# Patient Record
Sex: Male | Born: 1935 | Race: White | Hispanic: No
Health system: Southern US, Community
[De-identification: ages and names within clinical notes are randomized; demographics above are authoritative.]

## PROBLEM LIST (undated history)

## (undated) DIAGNOSIS — I440 Atrioventricular block, first degree: Secondary | ICD-10-CM

## (undated) DIAGNOSIS — R7989 Other specified abnormal findings of blood chemistry: Secondary | ICD-10-CM

## (undated) DIAGNOSIS — E669 Obesity, unspecified: Secondary | ICD-10-CM

## (undated) DIAGNOSIS — R972 Elevated prostate specific antigen [PSA]: Secondary | ICD-10-CM

## (undated) DIAGNOSIS — K259 Gastric ulcer, unspecified as acute or chronic, without hemorrhage or perforation: Secondary | ICD-10-CM

## (undated) DIAGNOSIS — G250 Essential tremor: Secondary | ICD-10-CM

## (undated) DIAGNOSIS — R42 Dizziness and giddiness: Secondary | ICD-10-CM

## (undated) DIAGNOSIS — I69919 Unspecified symptoms and signs involving cognitive functions following unspecified cerebrovascular disease: Secondary | ICD-10-CM

## (undated) DIAGNOSIS — G252 Other specified forms of tremor: Secondary | ICD-10-CM

## (undated) DIAGNOSIS — I213 ST elevation (STEMI) myocardial infarction of unspecified site: Secondary | ICD-10-CM

## (undated) DIAGNOSIS — I10 Essential (primary) hypertension: Secondary | ICD-10-CM

## (undated) DIAGNOSIS — E559 Vitamin D deficiency, unspecified: Secondary | ICD-10-CM

## (undated) DIAGNOSIS — N529 Male erectile dysfunction, unspecified: Secondary | ICD-10-CM

## (undated) DIAGNOSIS — L258 Unspecified contact dermatitis due to other agents: Secondary | ICD-10-CM

## (undated) DIAGNOSIS — M25569 Pain in unspecified knee: Secondary | ICD-10-CM

## (undated) DIAGNOSIS — H409 Unspecified glaucoma: Secondary | ICD-10-CM

## (undated) DIAGNOSIS — N400 Enlarged prostate without lower urinary tract symptoms: Secondary | ICD-10-CM

## (undated) DIAGNOSIS — L570 Actinic keratosis: Secondary | ICD-10-CM

## (undated) DIAGNOSIS — R350 Frequency of micturition: Secondary | ICD-10-CM

## (undated) DIAGNOSIS — N3941 Urge incontinence: Secondary | ICD-10-CM

## (undated) DIAGNOSIS — G4700 Insomnia, unspecified: Secondary | ICD-10-CM

## (undated) DIAGNOSIS — R269 Unspecified abnormalities of gait and mobility: Secondary | ICD-10-CM

## (undated) HISTORY — DX: Urge incontinence: N39.41

## (undated) HISTORY — PX: MULTIPLE TOOTH EXTRACTIONS: SHX2053

## (undated) HISTORY — DX: Elevated prostate specific antigen (PSA): R97.20

## (undated) HISTORY — DX: Gastric ulcer, unspecified as acute or chronic, without hemorrhage or perforation: K25.9

## (undated) HISTORY — DX: Vitamin D deficiency, unspecified: E55.9

## (undated) HISTORY — DX: Unspecified abnormalities of gait and mobility: R26.9

## (undated) HISTORY — DX: Pain in unspecified knee: M25.569

## (undated) HISTORY — DX: Male erectile dysfunction, unspecified: N52.9

## (undated) HISTORY — DX: Actinic keratosis: L57.0

## (undated) HISTORY — DX: Benign prostatic hyperplasia without lower urinary tract symptoms: N40.0

## (undated) HISTORY — DX: Atrioventricular block, first degree: I44.0

## (undated) HISTORY — DX: Obesity, unspecified: E66.9

## (undated) HISTORY — DX: Essential tremor: G25.2

## (undated) HISTORY — DX: Dizziness and giddiness: R42

## (undated) HISTORY — DX: Other specified abnormal findings of blood chemistry: R79.89

## (undated) HISTORY — PX: CATARACT EXTRACTION W/ INTRAOCULAR LENS  IMPLANT, BILATERAL: SHX1307

## (undated) HISTORY — DX: Frequency of micturition: R35.0

## (undated) HISTORY — PX: APPENDECTOMY: SHX54

## (undated) HISTORY — DX: Unspecified symptoms and signs involving cognitive functions following unspecified cerebrovascular disease: I69.919

## (undated) HISTORY — DX: Unspecified contact dermatitis due to other agents: L25.8

## (undated) HISTORY — DX: Essential tremor: G25.0

## (undated) HISTORY — DX: Insomnia, unspecified: G47.00

## (undated) HISTORY — DX: Essential (primary) hypertension: I10

## (undated) HISTORY — DX: Unspecified glaucoma: H40.9

---

## 1975-11-24 HISTORY — PX: CYST EXCISION: SHX5701

## 1985-11-23 HISTORY — PX: TRANSURETHRAL RESECTION OF PROSTATE: SHX73

## 2005-12-31 ENCOUNTER — Ambulatory Visit: Payer: Self-pay | Admitting: Internal Medicine

## 2006-01-04 ENCOUNTER — Ambulatory Visit: Payer: Self-pay | Admitting: Internal Medicine

## 2006-02-02 ENCOUNTER — Ambulatory Visit: Payer: Self-pay | Admitting: Internal Medicine

## 2013-03-12 ENCOUNTER — Other Ambulatory Visit: Payer: Self-pay | Admitting: Internal Medicine

## 2013-03-14 ENCOUNTER — Other Ambulatory Visit: Payer: Self-pay | Admitting: Internal Medicine

## 2013-03-24 ENCOUNTER — Other Ambulatory Visit: Payer: Medicare Other

## 2013-03-24 ENCOUNTER — Other Ambulatory Visit: Payer: Self-pay | Admitting: *Deleted

## 2013-03-24 DIAGNOSIS — R972 Elevated prostate specific antigen [PSA]: Secondary | ICD-10-CM

## 2013-03-24 DIAGNOSIS — Z13220 Encounter for screening for lipoid disorders: Secondary | ICD-10-CM

## 2013-03-24 DIAGNOSIS — R7989 Other specified abnormal findings of blood chemistry: Secondary | ICD-10-CM

## 2013-03-24 DIAGNOSIS — I10 Essential (primary) hypertension: Secondary | ICD-10-CM

## 2013-03-25 LAB — BASIC METABOLIC PANEL
BUN/Creatinine Ratio: 16 (ref 10–22)
BUN: 19 mg/dL (ref 8–27)
CO2: 24 mmol/L (ref 19–28)
Calcium: 9.2 mg/dL (ref 8.6–10.2)
Chloride: 103 mmol/L (ref 97–108)
Creatinine, Ser: 1.21 mg/dL (ref 0.76–1.27)
Glucose: 130 mg/dL — ABNORMAL HIGH (ref 65–99)

## 2013-03-25 LAB — LIPID PANEL
Cholesterol, Total: 129 mg/dL (ref 100–199)
VLDL Cholesterol Cal: 13 mg/dL (ref 5–40)

## 2013-03-25 LAB — HEMOGLOBIN A1C: Est. average glucose Bld gHb Est-mCnc: 128 mg/dL

## 2013-03-29 ENCOUNTER — Encounter: Payer: Self-pay | Admitting: *Deleted

## 2013-03-29 ENCOUNTER — Ambulatory Visit (INDEPENDENT_AMBULATORY_CARE_PROVIDER_SITE_OTHER): Payer: Medicare Other | Admitting: Internal Medicine

## 2013-03-29 ENCOUNTER — Encounter: Payer: Self-pay | Admitting: Internal Medicine

## 2013-03-29 VITALS — BP 160/56 | HR 58 | Temp 96.6°F | Wt 175.2 lb

## 2013-03-29 DIAGNOSIS — R7989 Other specified abnormal findings of blood chemistry: Secondary | ICD-10-CM

## 2013-03-29 DIAGNOSIS — E119 Type 2 diabetes mellitus without complications: Secondary | ICD-10-CM | POA: Insufficient documentation

## 2013-03-29 DIAGNOSIS — E559 Vitamin D deficiency, unspecified: Secondary | ICD-10-CM | POA: Insufficient documentation

## 2013-03-29 DIAGNOSIS — G250 Essential tremor: Secondary | ICD-10-CM | POA: Insufficient documentation

## 2013-03-29 DIAGNOSIS — I10 Essential (primary) hypertension: Secondary | ICD-10-CM | POA: Insufficient documentation

## 2013-03-29 DIAGNOSIS — G252 Other specified forms of tremor: Secondary | ICD-10-CM

## 2013-03-29 NOTE — Patient Instructions (Signed)
Continue current medications. 

## 2013-03-29 NOTE — Progress Notes (Signed)
  Subjective:    Patient ID: John Mason, male    DOB: October 12, 1929, 79 y.o.   MRN: [id number]  HPI Recent head cold with cough, otherwise feeling well.  Knees doing better.  Rare urinary incontinence. Urgency. Denies nocturia.  Diabetes mellitus: Under reasonable control. Latest fasting glucose was 130. Hemoglobin A1c 6.1.   Review of Systems  Constitutional: Negative for fever, activity change, appetite change, fatigue and unexpected weight change.  HENT: Positive for hearing loss. Negative for ear pain, congestion, neck pain, neck stiffness, tinnitus and ear discharge.   Eyes: Negative.   Respiratory: Negative.   Cardiovascular: Negative.  Negative for chest pain, palpitations and leg swelling.  Gastrointestinal: Negative.   Endocrine:       Elevated blood sugars for several years. I would categorize him as diabetes mellitus, uncomplicated, diet-controlled.  Genitourinary: Negative.   Musculoskeletal: Negative.   Skin: Negative.   Allergic/Immunologic: Negative.   Neurological: Negative.        Patient has a slightly slurred speech which is normal for him.  Hematological: Negative.   Psychiatric/Behavioral: Negative.        Objective:   Physical Exam  Constitutional: He is oriented to person, place, and time. He appears well-developed and well-nourished. No distress.  HENT:  Head: Normocephalic and atraumatic.  Mild hearing loss.  Eyes: Conjunctivae and EOM are normal. Pupils are equal, round, and reactive to light.  Neck: Normal range of motion. Neck supple. No JVD present. No tracheal deviation present. No thyromegaly present.  Cardiovascular: Normal rate, regular rhythm and intact distal pulses.  Exam reveals friction rub. Exam reveals no gallop.   No murmur heard. Pulmonary/Chest: Effort normal and breath sounds normal. No respiratory distress. He has no wheezes. He has no rales. He exhibits no tenderness.  Abdominal: Soft. Bowel sounds are normal. He exhibits no  distension and no mass. There is no tenderness.  Musculoskeletal: Normal range of motion. He exhibits no edema and no tenderness.  Lymphadenopathy:    He has no cervical adenopathy.  Neurological: He is alert and oriented to person, place, and time. No cranial nerve deficit.  Skin: Skin is warm and dry. No rash noted. There is erythema. No pallor.  Psychiatric: He has a normal mood and affect. His behavior is normal. Judgment and thought content normal.     Lab reports 03/24/13 BMP: glu 130, otherwise nl  A1c 6.1  LipId: TC 1129, trig 66, HDL 51, LDL 65     Assessment & Plan:  1. Unspecified vitamin D deficiency It should be rechecked in the future  2. Essential and other specified forms of tremor Patient says his tremor is better.  3. Unspecified essential hypertension Under good control - Basic Metabolic Panel; Future  4. Other abnormal blood chemistry Elevated blood sugars and transition to a point that I feel it is appropriate to call him a diabetic  5. Type II or unspecified type diabetes mellitus without mention of complication, not stated as uncontrolled Under reasonable control. - Hemoglobin A1c; Future

## 2013-10-02 ENCOUNTER — Other Ambulatory Visit: Payer: Medicare Other

## 2013-10-02 DIAGNOSIS — I10 Essential (primary) hypertension: Secondary | ICD-10-CM

## 2013-10-02 DIAGNOSIS — E119 Type 2 diabetes mellitus without complications: Secondary | ICD-10-CM

## 2013-10-03 LAB — BASIC METABOLIC PANEL
CO2: 27 mmol/L (ref 18–29)
Calcium: 9.7 mg/dL (ref 8.6–10.2)
Chloride: 108 mmol/L (ref 97–108)
Potassium: 5 mmol/L (ref 3.5–5.2)
Sodium: 148 mmol/L — ABNORMAL HIGH (ref 134–144)

## 2013-10-10 ENCOUNTER — Ambulatory Visit: Payer: Medicare Other | Admitting: Internal Medicine

## 2013-10-27 ENCOUNTER — Encounter: Payer: Self-pay | Admitting: *Deleted

## 2013-10-31 ENCOUNTER — Encounter: Payer: Self-pay | Admitting: Internal Medicine

## 2013-10-31 ENCOUNTER — Ambulatory Visit (INDEPENDENT_AMBULATORY_CARE_PROVIDER_SITE_OTHER): Payer: Medicare Other | Admitting: Internal Medicine

## 2013-10-31 VITALS — BP 170/68 | HR 62 | Temp 97.8°F | Resp 14 | Wt 179.0 lb

## 2013-10-31 DIAGNOSIS — N3941 Urge incontinence: Secondary | ICD-10-CM | POA: Insufficient documentation

## 2013-10-31 DIAGNOSIS — R7989 Other specified abnormal findings of blood chemistry: Secondary | ICD-10-CM

## 2013-10-31 DIAGNOSIS — R32 Unspecified urinary incontinence: Secondary | ICD-10-CM | POA: Insufficient documentation

## 2013-10-31 DIAGNOSIS — I10 Essential (primary) hypertension: Secondary | ICD-10-CM

## 2013-10-31 DIAGNOSIS — G250 Essential tremor: Secondary | ICD-10-CM

## 2013-10-31 MED ORDER — SOLIFENACIN SUCCINATE 10 MG PO TABS: ORAL_TABLET | ORAL | Status: DC

## 2013-10-31 MED ORDER — AMLODIPINE BESYLATE 5 MG PO TABS: ORAL_TABLET | ORAL | Status: DC

## 2013-10-31 NOTE — Patient Instructions (Signed)
Continue current medications. 

## 2013-10-31 NOTE — Progress Notes (Signed)
Subjective:    Patient ID: John Mason, male    DOB: Sep 26, 1929, 79 y.o.   MRN: [id number]  Chief Complaint  Patient presents with  . Medical Managment of Chronic Issues    6 month f/u   . Immunizations    will get his Flu vaccine, unsure of rather he had Pneumo-? not in misosis  . other    would like more samples of the medication for bladder control    HPI Hypertension; poorcontrol  Other abnormal blood chemistry: elevated glucose  Unspecified urinary incontinence: episodes of urge incontinence  Urgency incontinence: episodic  Essential and other specified forms of tremor: mild    Current Outpatient Prescriptions on File Prior to Visit  Medication Sig Dispense Refill  . atenolol (TENORMIN) 50 MG tablet Take 50 mg by mouth daily.       . finasteride (PROSCAR) 5 MG tablet TAKE 1 TABLET BY MOUTH DAILY FOR PROSTATE  30 tablet  7  . latanoprost (XALATAN) 0.005 % ophthalmic solution       . losartan-hydrochlorothiazide (HYZAAR) 100-25 MG per tablet TAKE 1 TABLET DAILY TO CONTROL BLOOD PRESSURE  90 tablet  3  . timolol (TIMOPTIC) 0.5 % ophthalmic solution        No current facility-administered medications on file prior to visit.    Review of Systems  Constitutional: Negative for fever, activity change, appetite change, fatigue and unexpected weight change.  HENT: Positive for hearing loss. Negative for congestion, ear discharge, ear pain and tinnitus.   Eyes: Negative.   Respiratory: Negative.   Cardiovascular: Negative.  Negative for chest pain, palpitations and leg swelling.  Gastrointestinal: Negative.   Endocrine:       Elevated blood sugars for several years. I would categorize him as diabetes mellitus, uncomplicated, diet-controlled.  Genitourinary: Negative.   Musculoskeletal: Negative.  Negative for neck pain and neck stiffness.  Skin: Negative.   Allergic/Immunologic: Negative.   Neurological: Negative.        Patient has a slightly slurred speech which  is normal for him.  Hematological: Negative.   Psychiatric/Behavioral: Negative.        Objective:BP 170/68  Pulse 62  Temp(Src) 97.8 F (36.6 C) (Oral)  Resp 14  Wt 179 lb (81.194 kg)  SpO2 96%    Physical Exam  Constitutional: He is oriented to person, place, and time. He appears well-developed and well-nourished. No distress.  HENT:  Head: Normocephalic and atraumatic.  Mild hearing loss.  Eyes: Conjunctivae and EOM are normal. Pupils are equal, round, and reactive to light.  Neck: Normal range of motion. Neck supple. No JVD present. No tracheal deviation present. No thyromegaly present.  Cardiovascular: Normal rate, regular rhythm and intact distal pulses.  Exam reveals friction rub. Exam reveals no gallop.   No murmur heard. Pulmonary/Chest: Effort normal and breath sounds normal. No respiratory distress. He has no wheezes. He has no rales. He exhibits no tenderness.  Abdominal: Soft. Bowel sounds are normal. He exhibits no distension and no mass. There is no tenderness.  Musculoskeletal: Normal range of motion. He exhibits no edema and no tenderness.  Lymphadenopathy:    He has no cervical adenopathy.  Neurological: He is alert and oriented to person, place, and time. No cranial nerve deficit.  Skin: Skin is warm and dry. No rash noted. There is erythema. No pallor.  Psychiatric: He has a normal mood and affect. His behavior is normal. Judgment and thought content normal.      Abstract on  10/27/2013  Component Date Value Range Status  . HM Colonoscopy 09/14/2005 [facility], pylops repeat    Final  Appointment on 10/02/2013  Component Date Value Range Status  . Glucose 10/02/2013 115* 65 - 99 mg/dL Final  . BUN 16/08/9603 24  8 - 27 mg/dL Final  . Creatinine, Ser 10/02/2013 1.26  0.76 - 1.27 mg/dL Final  . GFR calc non Af Amer 10/02/2013 53* >59 mL/min/1.73 Final  . GFR calc Af Amer 10/02/2013 61  >59 mL/min/1.73 Final  . BUN/Creatinine Ratio 10/02/2013  19  10 - 22 Final  . Sodium 10/02/2013 148* 134 - 144 mmol/L Final  . Potassium 10/02/2013 5.0  3.5 - 5.2 mmol/L Final  . Chloride 10/02/2013 108  97 - 108 mmol/L Final  . CO2 10/02/2013 27  18 - 29 mmol/L Final  . Calcium 10/02/2013 9.7  8.6 - 10.2 mg/dL Final  . Hemoglobin V4U 10/02/2013 6.2* 4.8 - 5.6 % Final   Comment:          Increased risk for diabetes: 5.7 - 6.4                                   Diabetes: >6.4                                   Glycemic control for adults with diabetes: <7.0  . Estimated average glucose 10/02/2013 131   Final       Assessment & Plan:  1. Other abnormal blood chemistry stable  2. Hypertension poor control - amLODipine (NORVASC) 5 MG tablet; One daily to control BP  Dispense: 90 tablet; Refill: 3  3. Unspecified urinary incontinence - solifenacin (VESICARE) 10 MG tablet; One daily to help bladder control  Dispense: 30 tablet; Refill: 5  4. Urgency incontinence  5. Essential and other specified forms of tremor mild

## 2013-11-13 ENCOUNTER — Other Ambulatory Visit: Payer: Self-pay | Admitting: Internal Medicine

## 2013-11-13 ENCOUNTER — Encounter: Payer: Self-pay | Admitting: Internal Medicine

## 2013-11-24 ENCOUNTER — Other Ambulatory Visit: Payer: Self-pay | Admitting: Internal Medicine

## 2013-11-27 ENCOUNTER — Other Ambulatory Visit: Payer: Self-pay | Admitting: Internal Medicine

## 2013-12-07 ENCOUNTER — Ambulatory Visit (INDEPENDENT_AMBULATORY_CARE_PROVIDER_SITE_OTHER): Payer: Medicare HMO

## 2013-12-07 DIAGNOSIS — Z23 Encounter for immunization: Secondary | ICD-10-CM

## 2013-12-19 ENCOUNTER — Ambulatory Visit: Payer: Medicare Other | Admitting: Internal Medicine

## 2013-12-23 ENCOUNTER — Other Ambulatory Visit: Payer: Self-pay | Admitting: Internal Medicine

## 2014-02-10 ENCOUNTER — Other Ambulatory Visit: Payer: Self-pay | Admitting: Internal Medicine

## 2014-03-15 ENCOUNTER — Telehealth: Payer: Self-pay

## 2014-03-15 NOTE — Telephone Encounter (Signed)
Patient walked into the office wanting to know if he could see [name], I told him that [name] was not in office today and won't be here until Tuesday 28th afternoon. Patient said someone from his Insurance came to his house 4/14 and told him his pulse was 45. He said he feels ok, was just driving by the office and wondered about it. Told him I could give him an appointment today with [name] or [name] at 3:15. He didn't want it, he wasn't going to go home then come back. I suggested that he should be seen, didn't want to and then left.

## 2014-03-22 ENCOUNTER — Other Ambulatory Visit: Payer: Self-pay | Admitting: Internal Medicine

## 2014-05-04 ENCOUNTER — Other Ambulatory Visit: Payer: Medicare HMO

## 2014-05-04 DIAGNOSIS — R7989 Other specified abnormal findings of blood chemistry: Secondary | ICD-10-CM

## 2014-05-05 LAB — BASIC METABOLIC PANEL
BUN/Creatinine Ratio: 22 (ref 10–22)
BUN: 26 mg/dL (ref 8–27)
CO2: 25 mmol/L (ref 18–29)
Calcium: 9.3 mg/dL (ref 8.6–10.2)
Chloride: 105 mmol/L (ref 97–108)
Creatinine, Ser: 1.19 mg/dL (ref 0.76–1.27)
GFR, EST AFRICAN AMERICAN: 65 mL/min/{1.73_m2} (ref >59)
GFR, EST NON AFRICAN AMERICAN: 56 mL/min/{1.73_m2} — AB (ref >59)
Glucose: 103 mg/dL — ABNORMAL HIGH (ref 65–99)
POTASSIUM: 4 mmol/L (ref 3.5–5.2)
Sodium: 144 mmol/L (ref 134–144)

## 2014-05-05 LAB — HEMOGLOBIN A1C
Est. average glucose Bld gHb Est-mCnc: 134 mg/dL
Hgb A1c MFr Bld: 6.3 % — ABNORMAL HIGH (ref 4.8–5.6)

## 2014-05-07 ENCOUNTER — Inpatient Hospital Stay (HOSPITAL_COMMUNITY): Payer: Medicare HMO

## 2014-05-07 ENCOUNTER — Inpatient Hospital Stay (HOSPITAL_COMMUNITY)
Admission: EM | Admit: 2014-05-07 | Discharge: 2014-05-11 | DRG: 246 | Disposition: A | Discharge status: Home / Self Care | Payer: Medicare HMO | Attending: Cardiology | Admitting: Cardiology

## 2014-05-07 ENCOUNTER — Encounter (HOSPITAL_COMMUNITY)
Admission: EM | Disposition: A | Discharge status: Home / Self Care | Payer: Self-pay | Source: Home / Self Care | Attending: Cardiology

## 2014-05-07 ENCOUNTER — Encounter (HOSPITAL_COMMUNITY): Payer: Self-pay | Admitting: Emergency Medicine

## 2014-05-07 DIAGNOSIS — N400 Enlarged prostate without lower urinary tract symptoms: Secondary | ICD-10-CM | POA: Diagnosis present

## 2014-05-07 DIAGNOSIS — I2510 Atherosclerotic heart disease of native coronary artery without angina pectoris: Secondary | ICD-10-CM | POA: Diagnosis present

## 2014-05-07 DIAGNOSIS — Z955 Presence of coronary angioplasty implant and graft: Secondary | ICD-10-CM

## 2014-05-07 DIAGNOSIS — I509 Heart failure, unspecified: Secondary | ICD-10-CM | POA: Diagnosis present

## 2014-05-07 DIAGNOSIS — I5041 Acute combined systolic (congestive) and diastolic (congestive) heart failure: Secondary | ICD-10-CM | POA: Diagnosis present

## 2014-05-07 DIAGNOSIS — I213 ST elevation (STEMI) myocardial infarction of unspecified site: Secondary | ICD-10-CM

## 2014-05-07 DIAGNOSIS — F17200 Nicotine dependence, unspecified, uncomplicated: Secondary | ICD-10-CM | POA: Diagnosis present

## 2014-05-07 DIAGNOSIS — I10 Essential (primary) hypertension: Secondary | ICD-10-CM | POA: Diagnosis present

## 2014-05-07 DIAGNOSIS — I2102 ST elevation (STEMI) myocardial infarction involving left anterior descending coronary artery: Secondary | ICD-10-CM

## 2014-05-07 DIAGNOSIS — I77819 Aortic ectasia, unspecified site: Secondary | ICD-10-CM | POA: Diagnosis present

## 2014-05-07 DIAGNOSIS — I440 Atrioventricular block, first degree: Secondary | ICD-10-CM | POA: Diagnosis present

## 2014-05-07 DIAGNOSIS — H409 Unspecified glaucoma: Secondary | ICD-10-CM | POA: Diagnosis present

## 2014-05-07 DIAGNOSIS — N401 Enlarged prostate with lower urinary tract symptoms: Secondary | ICD-10-CM

## 2014-05-07 DIAGNOSIS — G252 Other specified forms of tremor: Secondary | ICD-10-CM

## 2014-05-07 DIAGNOSIS — E669 Obesity, unspecified: Secondary | ICD-10-CM | POA: Diagnosis present

## 2014-05-07 DIAGNOSIS — I2109 ST elevation (STEMI) myocardial infarction involving other coronary artery of anterior wall: Secondary | ICD-10-CM | POA: Diagnosis present

## 2014-05-07 DIAGNOSIS — E119 Type 2 diabetes mellitus without complications: Secondary | ICD-10-CM | POA: Diagnosis present

## 2014-05-07 DIAGNOSIS — I359 Nonrheumatic aortic valve disorder, unspecified: Secondary | ICD-10-CM | POA: Diagnosis present

## 2014-05-07 DIAGNOSIS — G250 Essential tremor: Secondary | ICD-10-CM | POA: Diagnosis present

## 2014-05-07 DIAGNOSIS — N138 Other obstructive and reflux uropathy: Secondary | ICD-10-CM | POA: Diagnosis present

## 2014-05-07 HISTORY — DX: ST elevation (STEMI) myocardial infarction of unspecified site: I21.3

## 2014-05-07 HISTORY — PX: LEFT HEART CATHETERIZATION WITH CORONARY ANGIOGRAM: SHX5451

## 2014-05-07 HISTORY — PX: CORONARY ANGIOPLASTY WITH STENT PLACEMENT: SHX49

## 2014-05-07 LAB — TROPONIN I: Troponin I: 0.69 ng/mL (ref <0.30)

## 2014-05-07 LAB — BASIC METABOLIC PANEL
BUN: 28 mg/dL — ABNORMAL HIGH (ref 6–23)
BUN: 26 mg/dL — AB (ref 6–23)
CALCIUM: 9.5 mg/dL (ref 8.4–10.5)
CHLORIDE: 105 meq/L (ref 96–112)
CO2: 22 mEq/L (ref 19–32)
CO2: 16 mEq/L — ABNORMAL LOW (ref 19–32)
CREATININE: 1.3 mg/dL (ref 0.50–1.35)
CREATININE: 1.07 mg/dL (ref 0.50–1.35)
Calcium: 9.7 mg/dL (ref 8.4–10.5)
Chloride: 104 mEq/L (ref 96–112)
GFR, EST AFRICAN AMERICAN: 57 mL/min — AB (ref >90)
GFR, EST AFRICAN AMERICAN: 72 mL/min — AB (ref >90)
GFR, EST NON AFRICAN AMERICAN: 49 mL/min — AB (ref >90)
GFR, EST NON AFRICAN AMERICAN: 62 mL/min — AB (ref >90)
Glucose, Bld: 214 mg/dL — ABNORMAL HIGH (ref 70–99)
Glucose, Bld: 127 mg/dL — ABNORMAL HIGH (ref 70–99)
POTASSIUM: 4.1 meq/L (ref 3.7–5.3)
Potassium: 4.9 mEq/L (ref 3.7–5.3)
Sodium: 143 mEq/L (ref 137–147)
Sodium: 141 mEq/L (ref 137–147)

## 2014-05-07 LAB — APTT: aPTT: >200 seconds (ref 24–37)

## 2014-05-07 LAB — CBC WITH DIFFERENTIAL/PLATELET
BASOS PCT: 0 % (ref 0–1)
Basophils Absolute: 0 10*3/uL (ref 0.0–0.1)
EOS ABS: 0.2 10*3/uL (ref 0.0–0.7)
Eosinophils Relative: 2 % (ref 0–5)
HCT: 41.4 % (ref 39.0–52.0)
Hemoglobin: 13.6 g/dL (ref 13.0–17.0)
Lymphocytes Relative: 12 % (ref 12–46)
Lymphs Abs: 1.3 10*3/uL (ref 0.7–4.0)
MCH: 29.8 pg (ref 26.0–34.0)
MCHC: 32.9 g/dL (ref 30.0–36.0)
MCV: 90.6 fL (ref 78.0–100.0)
MONO ABS: 0.7 10*3/uL (ref 0.1–1.0)
Monocytes Relative: 6 % (ref 3–12)
NEUTROS PCT: 80 % — AB (ref 43–77)
Neutro Abs: 8.9 10*3/uL — ABNORMAL HIGH (ref 1.7–7.7)
Platelets: 125 10*3/uL — ABNORMAL LOW (ref 150–400)
RBC: 4.57 MIL/uL (ref 4.22–5.81)
RDW: 14.2 % (ref 11.5–15.5)
WBC: 11.1 10*3/uL — ABNORMAL HIGH (ref 4.0–10.5)

## 2014-05-07 LAB — GLUCOSE, CAPILLARY
GLUCOSE-CAPILLARY: 164 mg/dL — AB (ref 70–99)
GLUCOSE-CAPILLARY: 113 mg/dL — AB (ref 70–99)
Glucose-Capillary: 167 mg/dL — ABNORMAL HIGH (ref 70–99)

## 2014-05-07 LAB — CK TOTAL AND CKMB (NOT AT ARMC)
CK, MB: 190.7 ng/mL (ref 0.3–4.0)
CK, MB: 121.5 ng/mL — AB (ref 0.3–4.0)
RELATIVE INDEX: 12.9 — AB (ref 0.0–2.5)
RELATIVE INDEX: 10.8 — AB (ref 0.0–2.5)
Total CK: 1479 U/L — ABNORMAL HIGH (ref 7–232)
Total CK: 1129 U/L — ABNORMAL HIGH (ref 7–232)

## 2014-05-07 LAB — LIPID PANEL
CHOL/HDL RATIO: 2.9 ratio
Cholesterol: 116 mg/dL (ref 0–200)
Cholesterol: 134 mg/dL (ref 0–200)
HDL: 40 mg/dL (ref >39)
HDL: 53 mg/dL (ref >39)
LDL CALC: 63 mg/dL (ref 0–99)
LDL Cholesterol: 66 mg/dL (ref 0–99)
Total CHOL/HDL Ratio: 2.5 RATIO
Triglycerides: 50 mg/dL (ref <150)
Triglycerides: 88 mg/dL (ref <150)
VLDL: 10 mg/dL (ref 0–40)
VLDL: 18 mg/dL (ref 0–40)

## 2014-05-07 LAB — PROTIME-INR
INR: 1.43 (ref 0.00–1.49)
Prothrombin Time: 17.1 seconds — ABNORMAL HIGH (ref 11.6–15.2)

## 2014-05-07 LAB — MRSA PCR SCREENING: MRSA BY PCR: NEGATIVE

## 2014-05-07 LAB — POCT ACTIVATED CLOTTING TIME
ACTIVATED CLOTTING TIME: 221 s
Activated Clotting Time: 271 seconds

## 2014-05-07 LAB — TSH: TSH: 1.67 u[IU]/mL (ref 0.350–4.500)

## 2014-05-07 SURGERY — LEFT HEART CATHETERIZATION WITH CORONARY ANGIOGRAM
Anesthesia: LOCAL

## 2014-05-07 MED ORDER — INSULIN ASPART 100 UNIT/ML ~~LOC~~ SOLN
0.0000 [IU] | Freq: Three times a day (TID) | SUBCUTANEOUS | Status: DC
  Administered 2014-05-07: 2 [IU] via SUBCUTANEOUS
  Administered 2014-05-08 – 2014-05-11 (×7): 1 [IU] via SUBCUTANEOUS

## 2014-05-07 MED ORDER — TIMOLOL MALEATE 0.5 % OP SOLN
1.0000 [drp] | Freq: Every day | OPHTHALMIC | Status: DC
  Administered 2014-05-07 – 2014-05-11 (×5): 1 [drp] via OPHTHALMIC
  Filled 2014-05-07: qty 5

## 2014-05-07 MED ORDER — DARIFENACIN HYDROBROMIDE ER 7.5 MG PO TB24
7.5000 mg | ORAL_TABLET | Freq: Every day | ORAL | Status: DC
  Administered 2014-05-07 – 2014-05-11 (×5): 7.5 mg via ORAL
  Filled 2014-05-07 (×5): qty 1

## 2014-05-07 MED ORDER — FENTANYL CITRATE 0.05 MG/ML IJ SOLN
25.0000 ug | INTRAMUSCULAR | Status: DC | PRN
  Administered 2014-05-07: 25 ug via INTRAVENOUS

## 2014-05-07 MED ORDER — HYDRALAZINE HCL 20 MG/ML IJ SOLN
10.0000 mg | INTRAMUSCULAR | Status: DC | PRN
  Administered 2014-05-07 – 2014-05-08 (×2): 10 mg via INTRAVENOUS
  Filled 2014-05-07 (×2): qty 1

## 2014-05-07 MED ORDER — HEPARIN SODIUM (PORCINE) 1000 UNIT/ML IJ SOLN
INTRAMUSCULAR | Status: AC
  Filled 2014-05-07: qty 1

## 2014-05-07 MED ORDER — DIAZEPAM 2 MG PO TABS: 2.0000 mg | ORAL_TABLET | ORAL | Status: DC | PRN

## 2014-05-07 MED ORDER — LOSARTAN POTASSIUM 50 MG PO TABS
100.0000 mg | ORAL_TABLET | Freq: Every day | ORAL | Status: DC
  Administered 2014-05-07 – 2014-05-11 (×5): 100 mg via ORAL
  Filled 2014-05-07 (×5): qty 2

## 2014-05-07 MED ORDER — MIDAZOLAM HCL 2 MG/2ML IJ SOLN
INTRAMUSCULAR | Status: AC
  Filled 2014-05-07: qty 2

## 2014-05-07 MED ORDER — HEPARIN (PORCINE) IN NACL 2-0.9 UNIT/ML-% IJ SOLN
INTRAMUSCULAR | Status: AC
  Filled 2014-05-07: qty 1500

## 2014-05-07 MED ORDER — NITROGLYCERIN 0.4 MG SL SUBL
0.4000 mg | SUBLINGUAL_TABLET | SUBLINGUAL | Status: DC | PRN
  Administered 2014-05-07: 0.4 mg via SUBLINGUAL

## 2014-05-07 MED ORDER — NITROGLYCERIN 0.4 MG SL SUBL
SUBLINGUAL_TABLET | SUBLINGUAL | Status: AC
  Filled 2014-05-07: qty 1

## 2014-05-07 MED ORDER — ATENOLOL 25 MG PO TABS
25.0000 mg | ORAL_TABLET | Freq: Two times a day (BID) | ORAL | Status: DC
  Administered 2014-05-07: 25 mg via ORAL
  Filled 2014-05-07 (×4): qty 1

## 2014-05-07 MED ORDER — HEPARIN SODIUM (PORCINE) 5000 UNIT/ML IJ SOLN
4000.0000 [IU] | Freq: Once | INTRAMUSCULAR | Status: AC
  Administered 2014-05-07: 4000 [IU] via INTRAVENOUS

## 2014-05-07 MED ORDER — HYDROCHLOROTHIAZIDE 25 MG PO TABS
25.0000 mg | ORAL_TABLET | Freq: Every day | ORAL | Status: DC
  Administered 2014-05-07 – 2014-05-11 (×5): 25 mg via ORAL
  Filled 2014-05-07 (×5): qty 1

## 2014-05-07 MED ORDER — LIDOCAINE HCL (PF) 1 % IJ SOLN
INTRAMUSCULAR | Status: AC
  Filled 2014-05-07: qty 30

## 2014-05-07 MED ORDER — ONDANSETRON HCL 4 MG/2ML IJ SOLN: 4.0000 mg | Freq: Four times a day (QID) | INTRAMUSCULAR | Status: DC | PRN

## 2014-05-07 MED ORDER — SODIUM CHLORIDE 0.9 % IJ SOLN: 3.0000 mL | INTRAMUSCULAR | Status: DC | PRN

## 2014-05-07 MED ORDER — ONDANSETRON HCL 4 MG/2ML IJ SOLN: 4.0000 mg | Freq: Four times a day (QID) | INTRAMUSCULAR | Status: DC

## 2014-05-07 MED ORDER — PRASUGREL HCL 10 MG PO TABS
10.0000 mg | ORAL_TABLET | Freq: Every day | ORAL | Status: DC
  Administered 2014-05-07 – 2014-05-11 (×5): 10 mg via ORAL
  Filled 2014-05-07 (×6): qty 1

## 2014-05-07 MED ORDER — NITROGLYCERIN 0.2 MG/ML ON CALL CATH LAB
INTRAVENOUS | Status: AC
  Filled 2014-05-07: qty 1

## 2014-05-07 MED ORDER — PRASUGREL HCL 10 MG PO TABS
60.0000 mg | ORAL_TABLET | Freq: Once | ORAL | Status: DC
  Filled 2014-05-07: qty 6

## 2014-05-07 MED ORDER — SODIUM CHLORIDE 0.9 % IJ SOLN
3.0000 mL | Freq: Two times a day (BID) | INTRAMUSCULAR | Status: DC
  Administered 2014-05-07 – 2014-05-10 (×7): 3 mL via INTRAVENOUS

## 2014-05-07 MED ORDER — FENTANYL CITRATE 0.05 MG/ML IJ SOLN
INTRAMUSCULAR | Status: AC
  Filled 2014-05-07: qty 2

## 2014-05-07 MED ORDER — LATANOPROST 0.005 % OP SOLN
1.0000 [drp] | Freq: Every day | OPHTHALMIC | Status: DC
  Administered 2014-05-07 – 2014-05-10 (×4): 1 [drp] via OPHTHALMIC
  Filled 2014-05-07: qty 2.5

## 2014-05-07 MED ORDER — FINASTERIDE 5 MG PO TABS
5.0000 mg | ORAL_TABLET | Freq: Every day | ORAL | Status: DC
  Administered 2014-05-07 – 2014-05-11 (×5): 5 mg via ORAL
  Filled 2014-05-07 (×5): qty 1

## 2014-05-07 MED ORDER — PNEUMOCOCCAL VAC POLYVALENT 25 MCG/0.5ML IJ INJ
0.5000 mL | INJECTION | INTRAMUSCULAR | Status: AC
Start: 2014-05-08 — End: 2014-05-08
  Administered 2014-05-08: 0.5 mL via INTRAMUSCULAR
  Filled 2014-05-07: qty 0.5

## 2014-05-07 MED ORDER — MORPHINE SULFATE 4 MG/ML IJ SOLN
4.0000 mg | Freq: Once | INTRAMUSCULAR | Status: AC
  Administered 2014-05-07: 4 mg via INTRAVENOUS

## 2014-05-07 MED ORDER — ASPIRIN 81 MG PO CHEW
81.0000 mg | CHEWABLE_TABLET | Freq: Every day | ORAL | Status: DC
  Administered 2014-05-07 – 2014-05-11 (×5): 81 mg via ORAL
  Filled 2014-05-07 (×5): qty 1

## 2014-05-07 MED ORDER — SODIUM CHLORIDE 0.9 % IV SOLN: 250.0000 mL | INTRAVENOUS | Status: DC | PRN

## 2014-05-07 MED ORDER — SODIUM CHLORIDE 0.9 % IV SOLN
1.0000 mL/kg/h | INTRAVENOUS | Status: AC
  Administered 2014-05-07: 1 mL/kg/h via INTRAVENOUS

## 2014-05-07 MED ORDER — LOSARTAN POTASSIUM-HCTZ 100-25 MG PO TABS: 1.0000 | ORAL_TABLET | Freq: Every day | ORAL | Status: DC

## 2014-05-07 MED ORDER — AMLODIPINE BESYLATE 5 MG PO TABS
5.0000 mg | ORAL_TABLET | Freq: Every day | ORAL | Status: DC
  Administered 2014-05-07: 5 mg via ORAL
  Filled 2014-05-07 (×2): qty 1

## 2014-05-07 MED ORDER — OXYCODONE-ACETAMINOPHEN 5-325 MG PO TABS
1.0000 | ORAL_TABLET | ORAL | Status: DC | PRN
  Administered 2014-05-07: 1 via ORAL
  Filled 2014-05-07 (×4): qty 2

## 2014-05-07 NOTE — ED Notes (Signed)
Patient to cath lab at this time with RN

## 2014-05-07 NOTE — Progress Notes (Signed)
Notified Dr Jacinto HalimGanji of patients new complaint of nausea. Also, notified Dr of patients SBP> 150. New orders received.   Dawson BillsKim Kalista Laguardia, RN

## 2014-05-07 NOTE — ED Notes (Signed)
Patient was awaken with chest pain and shortness of breath.  Patient drove himself to [facility] with the chest pain and shortness of breath.  Patient found to be having STEMI.  Patient was given 324mg  ASA, 4mg  Morphine and one SL nitro.  Pain was initially 10/10 in his back, between shoulder blades.  Patient pain was not relieved.  Patient is CAOx3 on arrival to ED.  VSS during transport.

## 2014-05-07 NOTE — H&P (Signed)
John Mason is an 80 y.o. male.   Chief Complaint: Back pain HPI: Patient is a 80 year old Caucasian male with history of hypertension, BPH, admitted via EMS when he presented with back pain and segment elevation in the anterior leads, due to ongoing chest pain and abnormal EKG I was called to see the patient for possible coronary angiography.  Patient complains of severe pain in the back. Is no other radiation, no nausea or vomiting. 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  There is no history of TIA or stroke in the past. Patient denies any symptoms of claudication. Fairly active, [employer] motorcycle. He chews tobacco.  Past Medical History  Diagnosis Date  . Urge incontinence   . Urinary frequency   . Unspecified vitamin D deficiency   . Contact dermatitis and other eczema due to other specified agent   . Pain in joint, lower leg   . Cognitive deficits, late effect of cerebrovascular disease   . Essential and other specified forms of tremor   . Obesity, unspecified   . First degree atrioventricular block   . Abnormality of gait   . Other abnormal blood chemistry   . Unspecified glaucoma   . Hypertrophy of prostate without urinary obstruction and other lower urinary tract symptoms (LUTS)   . Impotence of organic origin   . Actinic keratosis   . Insomnia, unspecified   . Elevated prostate specific antigen (PSA)   . Dizziness and giddiness   . Gastric ulcer, unspecified as acute or chronic, without mention of hemorrhage, perforation, or obstruction   . Unspecified essential hypertension     Past Surgical History  Procedure Laterality Date  . Appendectomy    . Left knee cyst removed  1977  . Transurethral resection of prostate  1987    Family History  Problem Relation Age of Onset  . Stroke Mother   . Stroke  Sister    Social History:  reports that he has been smoking Pipe.  His smokeless tobacco use includes Snuff. He reports that he does not drink alcohol or use illicit drugs.  Allergies: No Known Allergies   (Not in a hospital admission)    Review of Systems - denies any bowel disturbances, has BPH he is controlled with medication, no recent weight gain, denies any symptoms of GI bleed. No recent weight changes. No neurological cyst. Other systems negative.  Blood pressure 173/73, pulse 79, temperature 97.8 F (36.6 C), temperature source Oral, resp. rate 17, height 5\' 9"  (1.753 m), weight 79.379 kg (175 lb), SpO2 94.00%. General appearance: alert, cooperative, appears stated age, mild distress and in pain Eyes: negative findings: lids and lashes normal Neck: no JVD, supple, symmetrical, trachea midline, thyroid not enlarged, symmetric, no tenderness/mass/nodules and faint carotid bruit present Neck: JVP - normal, carotids 2+= without bruits Resp: clear to auscultation bilaterally Chest wall: no tenderness Cardio: regular rate and rhythm, S1, S2 normal, no murmur, click, rub or gallop GI: soft, non-tender; bowel sounds normal; no masses,  no organomegaly Extremities: extremities normal, atraumatic, no cyanosis or edema Pulses: 2+ and symmetric Skin: Skin color, texture, turgor normal. No rashes or lesions Neurologic: Grossly normal  No results found for this or any previous visit (from the past 48 hour(s)). No results found.  Labs:   No results found for this basename: WBC, HGB,  HCT, MCV, PLT    Recent Labs Lab 05/04/14 0832  NA 144  K 4.0  CL 105  CO2 25  BUN 26  CREATININE 1.19  CALCIUM 9.3  GLUCOSE 103*   No results found for this basename: CKTOTAL, CKMB, CKMBINDEX, TROPONINI    Lipid Panel     Component Value Date/Time   TRIG 66 03/24/2013 1152   HDL 51 03/24/2013 1152   CHOLHDL 2.5 03/24/2013 1152   LDLCALC 65 03/24/2013 1152    EKG: Sinus rhythm with first degree  AV block, left atrial enlargement, normal axis, right branch block. ST elevation in V2, V3 and V4 suggestive of ST elevation myocardial infarction. Reciprocal ST depression in 1 and aVL. These changes are new compared to 02/11/2011.  Assessment/Plan 1. Anterior ST elevation myocardial infarction 2. Hypertension 3. Hyperglycemia, HbA1c on 05/04/2014:  6.3%. 4. Normal lipid status  Recommendation: Patient be taken to the cardiac catheterization lab on emergent basis. Patient's daughter came in just before we took the patient to the Cath Lab, I have discussed with him regarding the procedure. He understands the risk and benefits. Although patient presents with back pain, blood pressure equal in all 4 extremities and there is no blood pressure differential between the upper extremities.   Pamella PertGANJI,JAGADEESH R, MD 05/07/2014, 4:43 AM [facility] Pager: [phone] Office: [phone] If no answer: Cell:  [phone]

## 2014-05-07 NOTE — Progress Notes (Signed)
Echocardiogram 2D Echocardiogram has been performed.  John Mason, John Mason 05/07/2014, 11:50 AM

## 2014-05-07 NOTE — Progress Notes (Addendum)
Notified Dr Jacinto HalimGanji of patients unrelieved pain. Pt is very tired and can not give a consistent or clear description of his pain. He states it is the "same type of pain that brought him to the hospital but only half as bad". EKG obtained and there are no changes. He also seems very restless and irritable at times. New orders received; Dr Jacinto HalimGanji coming to bedside.  Dawson BillsKim Trevion Hoben, RN

## 2014-05-07 NOTE — ED Provider Notes (Signed)
CSN: [id number]     Arrival date & time 05/07/14  0421 History   First MD Initiated Contact with Patient 05/07/14 0430     Chief Complaint  Patient presents with  . Code STEMI     (Consider location/radiation/quality/duration/timing/severity/associated sxs/prior Treatment) HPI Comments: Pt comes in with cc of shoulder pain. Pt states that he woke up middle of the night around midnight. Pt's pain is severe, sharp. No anterior chest pain. Pt has no nausea, + dib and one episode of diaphoresis. Pt denies any numbness, tingling, abd pain, dizziness.   Hx of HTN, and chewing tobacco hx. No known CAD.  The history is provided by the patient and the EMS personnel.    Past Medical History  Diagnosis Date  . Urge incontinence   . Urinary frequency   . Unspecified vitamin D deficiency   . Contact dermatitis and other eczema due to other specified agent   . Pain in joint, lower leg   . Cognitive deficits, late effect of cerebrovascular disease   . Essential and other specified forms of tremor   . Obesity, unspecified   . First degree atrioventricular block   . Abnormality of gait   . Other abnormal blood chemistry   . Unspecified glaucoma   . Hypertrophy of prostate without urinary obstruction and other lower urinary tract symptoms (LUTS)   . Impotence of organic origin   . Actinic keratosis   . Insomnia, unspecified   . Elevated prostate specific antigen (PSA)   . Dizziness and giddiness   . Gastric ulcer, unspecified as acute or chronic, without mention of hemorrhage, perforation, or obstruction   . Unspecified essential hypertension    Past Surgical History  Procedure Laterality Date  . Appendectomy    . Left knee cyst removed  1977  . Transurethral resection of prostate  1987   Family History  Problem Relation Age of Onset  . Stroke Mother   . Stroke Sister    History  Substance Use Topics  . Smoking status: Light Tobacco Smoker    Types: Pipe  . Smokeless tobacco:  Current User    Types: Snuff  . Alcohol Use: No    Review of Systems  Constitutional: Positive for diaphoresis. Negative for fever, chills and activity change.  Eyes: Negative for visual disturbance.  Respiratory: Negative for cough, chest tightness, shortness of breath and wheezing.   Cardiovascular: Negative for chest pain.  Gastrointestinal: Negative for abdominal distention.  Genitourinary: Negative for dysuria, enuresis and difficulty urinating.  Musculoskeletal: Negative for arthralgias and neck pain.  Neurological: Positive for dizziness. Negative for light-headedness and headaches.  Psychiatric/Behavioral: Negative for confusion.      Allergies  Review of patient's allergies indicates no known allergies.  Home Medications   Prior to Admission medications   Medication Sig Start Date End Date Taking? Authorizing Provider  amLODipine (NORVASC) 5 MG tablet One daily to control BP 10/31/13   Kimber RelicArthur G Green, MD  atenolol (TENORMIN) 50 MG tablet TAKE 1 TABLET BY MOUTH EVERY DAY TO LOWER BLOOD PRESSURE 11/13/13   Kimber RelicArthur G Green, MD  finasteride (PROSCAR) 5 MG tablet TAKE 1 TABLET BY MOUTH DAILY FOR PROSTATE    Kimber RelicArthur G Green, MD  latanoprost (XALATAN) 0.005 % ophthalmic solution  03/12/13   Historical Provider, MD  losartan-hydrochlorothiazide (HYZAAR) 100-25 MG per tablet TAKE 1 TABLET DAILY TO CONTROL BLOOD PRESSURE 03/22/14   Kimber RelicArthur G Green, MD  solifenacin (VESICARE) 10 MG tablet One daily to help bladder control  10/31/13   Kimber RelicArthur G Green, MD  timolol (TIMOPTIC) 0.5 % ophthalmic solution  03/12/13   Historical Provider, MD   BP 173/73  Pulse 79  Temp(Src) 97.8 F (36.6 C) (Oral)  Resp 17  Ht 5\' 9"  (1.753 m)  Wt 175 lb (79.379 kg)  BMI 25.83 kg/m2  SpO2 94% Physical Exam  Nursing note and vitals reviewed. Constitutional: He is oriented to person, place, and time. He appears well-developed.  HENT:  Head: Normocephalic and atraumatic.  Eyes: Conjunctivae and EOM are  normal. Pupils are equal, round, and reactive to light.  Neck: Normal range of motion. Neck supple.  Cardiovascular: Normal rate, regular rhythm and intact distal pulses.   Bilateral 2+ and equal radial and femoral pulse and DP  Pulmonary/Chest: Effort normal and breath sounds normal.  Abdominal: Soft. Bowel sounds are normal. He exhibits no distension. There is no tenderness. There is no rebound and no guarding.  Neurological: He is alert and oriented to person, place, and time. No cranial nerve deficit.  Skin: Skin is warm.    ED Course  Procedures (including critical care time) Labs Review Labs Reviewed  CBC WITH DIFFERENTIAL  BASIC METABOLIC PANEL  TROPONIN I  APTT  PROTIME-INR    Imaging Review No results found.   EKG Interpretation None      Date: 05/07/2014  Rate: 74  Rhythm: normal sinus rhythm  QRS Axis: normal  Intervals: normal  ST/T Wave abnormalities: ST elevations anteriorly, ST elevations laterally and ST depressions inferiorly  Conduction Disutrbances:none  Narrative Interpretation:   Old EKG Reviewed: changes noted    MDM   Final diagnoses:  STEMI (ST elevation myocardial infarction)    Pt with pain between the shoulder blades. Severe. EKG shows ST elevation in the anterolateral leads. Had ST elevation in the inferolateral leads per EMS EKG. Dissection considered - but neurovascular exam is normal.  Cath team activated. Dr. Jacinto HalimGanji at bedside.     CRITICAL CARE Performed by: Derwood KaplanNanavati, Tellis Spivak   Total critical care time: 35 minutes  Critical care time was exclusive of separately billable procedures and treating other patients.  Critical care was necessary to treat or prevent imminent or life-threatening deterioration.  Critical care was time spent personally by me on the following activities: development of treatment plan with patient and/or surrogate as well as nursing, discussions with consultants, evaluation of patient's response to  treatment, examination of patient, obtaining history from patient or surrogate, ordering and performing treatments and interventions, ordering and review of laboratory studies, ordering and review of radiographic studies, pulse oximetry and re-evaluation of patient's condition.    Derwood KaplanAnkit Ruford Dudzinski, MD 05/07/14 [phone]

## 2014-05-07 NOTE — ED Notes (Signed)
Dr Ganji at bedside. 

## 2014-05-07 NOTE — Progress Notes (Addendum)
Subjective:  Back pain  Objective:  Vital Signs in the last 24 hours: Temp:  [97.4 F (36.3 C)-97.8 F (36.6 C)] 97.6 F (36.4 C) (06/15 0800) Pulse Rate:  [54-82] 55 (06/15 1000) Resp:  [9-20] 10 (06/15 1000) BP: (156-173)/(52-86) 173/86 mmHg (06/15 1000) SpO2:  [93 %-98 %] 95 % (06/15 1000) Weight:  [79.379 kg (175 lb)] 79.379 kg (175 lb) (06/15 0425)  Intake/Output from previous day:    Physical Exam:   General appearance: alert, cooperative, appears stated age and no distress Eyes: negative findings: lids and lashes normal Neck: no carotid bruit, no JVD, supple, symmetrical, trachea midline and thyroid not enlarged, symmetric, no tenderness/mass/nodules Neck: JVP - normal, carotids 2+= without bruits Resp: Bilateral soft expiratory wheeze present. Chest wall: no tenderness Cardio: S1, S2 normal. Long Early diastolic murmur present II/IV in the left sternal border GI: soft, non-tender; bowel sounds normal; no masses,  no organomegaly Extremities: extremities normal, atraumatic, no cyanosis or edema and normal bounding pulses bilateral extremities.    Lab Results: BMP  Recent Labs  10/02/13 0924 05/04/14 0832 05/07/14 0438  NA 148* 144 143  K 5.0 4.0 4.1  CL 108 105 104  CO2 27 25 22   GLUCOSE 115* 103* 214*  BUN 24 26 28*  CREATININE 1.26 1.19 1.30  CALCIUM 9.7 9.3 9.7  GFRNONAA 53* 56* 49*  GFRAA 61 65 57*    CBC  Recent Labs Lab 05/07/14 0438  WBC 11.1*  RBC 4.57  HGB 13.6  HCT 41.4  PLT 125*  MCV 90.6  MCH 29.8  MCHC 32.9  RDW 14.2  LYMPHSABS 1.3  MONOABS 0.7  EOSABS 0.2  BASOSABS 0.0    HEMOGLOBIN A1C Lab Results  Component Value Date   HGBA1C 6.3* 05/04/2014    Cardiac Panel (last 3 results)  Recent Labs  05/07/14 0438  TROPONINI 0.69*    BNP (last 3 results) No results found for this basename: PROBNP,  in the last 8760 hours  TSH No results found for this basename: TSH,  in the last 8760 hours  CHOLESTEROL  Recent  Labs  05/07/14 0530  CHOL 116    Hepatic Function Panel No results found for this basename: PROT, ALBUMIN, AST, ALT, ALKPHOS, BILITOT, BILIDIR, IBILI,  in the last 8760 hours  Imaging:   Cardiac Studies:  EKG: Sinus with first degree, LAD, LAFB, RBBB.  ST elevation replaced by T inversion in anterolateral leads suggestive of evolution of ST elevation MI. Cannot exclude anterolateral ischemia.  Assessment/Plan:  1. Acute anterior STEMI S/P Primary PCI with 4x28 mm Xience DES. 2. Aortic valve regurgitation.  Rec: I am concerned about ongoing back pain that he initially presented. I am beginning to suspect aortic dissection. Will obtain stat echo and possible CT angio chest. S. Cr up to 1.3, will continue to monitor.    Pamella PertGANJI,JAGADEESH R, M.D. 05/07/2014, 11:18 AM [facility] Pager: [phone] Office: [phone] If no answer: [phone]   TTE: mild to mod AI, eccentric. Mild aortic root dilatation. No evidence of dissection. No flap. No echodensities that is suspicious for Type I dissection: Patient creatinine is increased, in fact the type II dissection, as the blood pressure is stable, he has good urine output, otherwise hemodynamically stable, awake to continue to watch him for at least another total 24-hour and depending on his presentation, I may consider CT angiogram of the chest.

## 2014-05-07 NOTE — CV Procedure (Addendum)
Procedure performed:  Left heart catheterization including hemodynamic monitoring of the left ventricle, LV gram. Ascending aortogram to evaluate for aortic dissection. Selective right and left coronary arteriography. PTCA and stenting of the proximal and midsegment of the LAD with implantation of a 4.0 x 28 mm Xience Alpine DES.  Indication: Patient is a 80 year-old Caucasian male with history of hypertension,  pre-Diabetes Mellitus   who presents with anterior wall ST elevation myocardial infarction. He was emergently brought to the coronary angiography suite for primary PCI evaluation.   Hemodynamic data: Left ventricular pressure was 134/16 with LVEDP of 18 mm mercury. Aortic pressure was 136/64 with a mean of 95 mm mercury. There was no pressure gradient across the aortic valve.   Left ventricle: Performed in the RAO projection revealed LVEF of 20-25%. There was No significant MR. entire anterolateral wall a candidate. Suboptimal study to evaluate mitral regurgitation due to hand contrast injection.  Right coronary artery: The vessel is very large caliber vessel, mild calcification is evident, it is Dominant.  Left main coronary artery is large and mild calcified.  Circumflex coronary artery: A large vessel giving origin to a large obtuse marginal 1. Mild diffuse disease is evident, ostium of the circumflex coronary artery has mild calcification.  Ramus intermediate: Moderate to large caliber vessel. Mild disease   LAD:  LAD gives origin to a large diagonal-1.  Mildly calcified. Just after the origin of the diagonal 1 there is a ulcerated high-grade thrombotic 90% stenosis. There is mild luminal irregularity, the diagonal 1 which is large and has a ostial 20-30% stenosis. Rest of the LAD is large calibered giving origin to a moderate sized D2 and mild diffuse luminal irregularity.  Ascending aortogram: There was no adequate dilatation. There was no significant aortic regurgitation. No  obvious dissection.  Impression: Single vessel coronary artery disease involving the mid LAD, however the proximal and midsegment LAD also shows mild luminal irregularity hence the stent was placed into the proximal and also midsegment of the LAD.  Interventional data: Successful PTCA and stenting of the proximal and midsegment of the LAD with implantation of a 4.0 x 28 mm Xience Alpine DES.  Will need Dual antiplatelet therapy with Effient and ASA 81 mg for at least one year and aspirin indefinitely.   Technique of diagnostic cardiac catheterization:  Under sterile precautions using a 6 French right radial  arterial access, a 6 French sheath was introduced into the right radial artery. A 5 JamaicaFrench Tig 4 catheter was advanced into the ascending aorta selective   left coronary artery was cannulated and angiography was performed in multiple views. The catheter was exchanged for a JR 4 diagnostic catheter, 5 French to engage the RCA and angiography was performed. Before the TIG 4 catheter was pulled out aortic root angiography was performed to exclude significant regurgitation and aortic dissection as patient complained of chest pain, back pain. Same catheter was utilized to perform LV gram in the RAO projection. The catheter was pulled back Out of the body over exchange length J-wire.  Technique of intervention:  Using a 6 [place]French XB 3.5 guide catheter the left main  coronary  was selected and cannulated. Using heparin for anticoagulation, I utilized a cougar 0.014 x 190 cm guidewire and across the LAD coronary artery without any difficulty. I placed the tip of the wire into the distal  coronary artery. Angiography was performed.   Then I utilized a 3.0 x 15 mm Euphora balloon , I performed balloon  angioplasty at 10 atmospheric pressure x 45 seconds. I proceeded with implantation of a 4.0 x 28 mm Xience  drug-eluting stent into the proximal and midsegment of the LAD coronary artery. The stent was deployed at  11 atmospheric pressure for 50  seconds. The stent was then post dilated with a 4.0 x 12 mm Plummer Euphoria balloon at 14 atmospheric pressure for 45 seconds in the distal end of the stent where there was luminal irregularity. Post-balloon angioplasty results were excellent with 0% residual stenoses and TIMI-3 flow was maintained. There was no evidence of edge dissection. The guidewire was withdrawn out of the body and the guide catheter was engaged and pulled out of the body over the J-wire the was no immediate complication. Patient tolerated the procedure well.  Disposition: Patient will be discharged in 24-48 hours unless complications with out-patient follow up. A total of 150 cc of contrast was utilized for diagnostic and interventional procedure. There was no immediate complication. Minimal blood loss.

## 2014-05-07 NOTE — Care Management Note (Signed)
    Page 1 of 1   05/11/2014     2:12:34 PM CARE MANAGEMENT NOTE 05/11/2014  Patient:  John Mason,John Mason   Account Number:  [id number]  Date Initiated:  05/07/2014  Documentation initiated by:  Junius CreamerWELL,DEBBIE  Subjective/Objective Assessment:   adm w mi     Action/Plan:   lives alone, pcp dr art green, supp da   Anticipated DC Date:  05/11/2014   Anticipated DC Plan:  HOME/SELF CARE      DC Planning Services  CM consult  Medication Assistance      Choice offered to / List presented to:             Status of service:  Completed, signed off Medicare Important Message given?  YES (If response is "NO", the following Medicare IM given date fields will be blank) Date Medicare IM given:  05/10/2014 Date Additional Medicare IM given:    Discharge Disposition:  HOME/SELF CARE  Per UR Regulation:  Reviewed for med. necessity/level of care/duration of stay  If discussed at Long Length of Stay Meetings, dates discussed:    Comments:  daughterClaris Gladden- [place]- cell- [phone], [phone] , work- [phone]  05/11/14- 1200- Donn PieriniKristi Clothilde Tippetts RN, BSN [phone] Pt for d/c home today, Effient folder with savings card given to pt's son-n-law, call made to [facility] on [address] - which has drug in stock. Per PT notes - no recommended f/u- no further CM needs.   05/10/14- 1130- Donn PieriniKristi Ferris Fielden RN, BSN [phone] In to see pt at bedside- copy of Medicare IM given to pt and signed copy placed on shadow chart- also discussed d/c needs- pt lives alone, PT eval pending- pt to go home on Effient- states that he thinks his daughter took card home- copay $45 - call made to [facility] on [address] [place] who state that they have drug in stock. - Will f/u for any PT recommendations-   6/15 1001a debbie dowell rn,bsn gave pt 30day free effient card.he has aenta medicare ins for meds has 45.00 per month copay w no prior auth req.

## 2014-05-07 NOTE — ED Notes (Signed)
All belongings given to daughter at bedside.

## 2014-05-08 ENCOUNTER — Ambulatory Visit: Payer: Medicare Other | Admitting: Internal Medicine

## 2014-05-08 LAB — BASIC METABOLIC PANEL
BUN: 22 mg/dL (ref 6–23)
CO2: 21 meq/L (ref 19–32)
CREATININE: 0.98 mg/dL (ref 0.50–1.35)
Calcium: 8.9 mg/dL (ref 8.4–10.5)
Chloride: 100 mEq/L (ref 96–112)
GFR calc Af Amer: 86 mL/min — ABNORMAL LOW (ref >90)
GFR, EST NON AFRICAN AMERICAN: 74 mL/min — AB (ref >90)
Glucose, Bld: 128 mg/dL — ABNORMAL HIGH (ref 70–99)
Potassium: 4 mEq/L (ref 3.7–5.3)
SODIUM: 137 meq/L (ref 137–147)

## 2014-05-08 LAB — CBC
HCT: 37 % — ABNORMAL LOW (ref 39.0–52.0)
Hemoglobin: 12.2 g/dL — ABNORMAL LOW (ref 13.0–17.0)
MCH: 29.5 pg (ref 26.0–34.0)
MCHC: 33 g/dL (ref 30.0–36.0)
MCV: 89.4 fL (ref 78.0–100.0)
Platelets: 118 10*3/uL — ABNORMAL LOW (ref 150–400)
RBC: 4.14 MIL/uL — ABNORMAL LOW (ref 4.22–5.81)
RDW: 14.4 % (ref 11.5–15.5)
WBC: 12.2 10*3/uL — ABNORMAL HIGH (ref 4.0–10.5)

## 2014-05-08 LAB — TROPONIN I
Troponin I: >20 ng/mL (ref <0.30)
Troponin I: 18.9 ng/mL (ref <0.30)

## 2014-05-08 LAB — CK TOTAL AND CKMB (NOT AT ARMC)
CK TOTAL: 868 U/L — AB (ref 7–232)
CK, MB: 71.4 ng/mL (ref 0.3–4.0)
RELATIVE INDEX: 8.2 — AB (ref 0.0–2.5)

## 2014-05-08 LAB — GLUCOSE, CAPILLARY
GLUCOSE-CAPILLARY: 119 mg/dL — AB (ref 70–99)
GLUCOSE-CAPILLARY: 150 mg/dL — AB (ref 70–99)
GLUCOSE-CAPILLARY: 132 mg/dL — AB (ref 70–99)
Glucose-Capillary: 136 mg/dL — ABNORMAL HIGH (ref 70–99)
Glucose-Capillary: 109 mg/dL — ABNORMAL HIGH (ref 70–99)

## 2014-05-08 MED ORDER — CARVEDILOL 12.5 MG PO TABS
12.5000 mg | ORAL_TABLET | Freq: Two times a day (BID) | ORAL | Status: DC
  Administered 2014-05-08: 12.5 mg via ORAL
  Filled 2014-05-08 (×5): qty 1

## 2014-05-08 MED ORDER — ACETAMINOPHEN 325 MG PO TABS
650.0000 mg | ORAL_TABLET | ORAL | Status: DC | PRN
  Administered 2014-05-08: 650 mg via ORAL
  Filled 2014-05-08: qty 2

## 2014-05-08 NOTE — Progress Notes (Signed)
CARDIAC REHAB PHASE I   PRE:  Rate/Rhythm: 57 SB  BP:  Supine:   Sitting: 121/52  Standing:    SaO2: 97 2L  MODE:  Ambulation: 350 ft   POST:  Rate/Rhythm: 76 SR  BP:  Supine:   Sitting: 134/45  Standing:    SaO2: 99 2L 1320-1415 On arrival pt in recliner on O2 2L. Pt is SOB with talking and moving. Assisted X 1 used walker and O 2L to ambulate Gait steady with walker. Pt able to walk 350 feet without c/o of pain. He is SOB with walking, O2 sat after walk 99% on 2L after walk. Started MI and stent education with pt and daughter. He voices understanding. We will continue to follow pt to continue education and ambulation.  Melina CopaLisa Hughes RN 05/08/2014 2:16 PM

## 2014-05-08 NOTE — Progress Notes (Signed)
Subjective:  Back pain has significantly improved. No dyspnea. Feels tired. Needed Hydralazine twice last night for SBP > 130 mm Hg.  Objective:  Vital Signs in the last 24 hours: Temp:  [97.6 F (36.4 C)-98.8 F (37.1 C)] 98.8 F (37.1 C) (06/16 0700) Pulse Rate:  [46-68] 68 (06/16 0700) Resp:  [9-31] 15 (06/16 0700) BP: (110-173)/(26-86) 137/43 mmHg (06/16 0700) SpO2:  [93 %-100 %] 99 % (06/16 0700)  Intake/Output from previous day: 06/15 0701 - 06/16 0700 In: 1016.7 [P.O.:490; I.V.:526.7] Out: 1300 [Urine:1300]  Physical Exam:   General appearance: alert, cooperative, appears stated age and no distress Eyes: negative findings: lids and lashes normal Neck: no carotid bruit, no JVD, supple, symmetrical, trachea midline and thyroid not enlarged, symmetric, no tenderness/mass/nodules Neck: JVP - normal, carotids 2+= without bruits Resp: Bilateral soft expiratory wheeze present. Chest wall: no tenderness Cardio: S1, S2 normal. Long Early diastolic murmur present II/IV in the left sternal border GI: soft, non-tender; bowel sounds normal; no masses,  no organomegaly Extremities: extremities normal, atraumatic, no cyanosis or edema and normal bounding pulses bilateral extremities. Right radial site ecchymosis noted.    Lab Results: BMP  Recent Labs  05/07/14 0438 05/07/14 1244 05/08/14 0317  NA 143 141 137  K 4.1 4.9 4.0  CL 104 105 100  CO2 22 16* 21  GLUCOSE 214* 127* 128*  BUN 28* 26* 22  CREATININE 1.30 1.07 0.98  CALCIUM 9.7 9.5 8.9  GFRNONAA 49* 62* 74*  GFRAA 57* 72* 86*    CBC  Recent Labs Lab 05/07/14 0438 05/08/14 0317  WBC 11.1* 12.2*  RBC 4.57 4.14*  HGB 13.6 12.2*  HCT 41.4 37.0*  PLT 125* 118*  MCV 90.6 89.4  MCH 29.8 29.5  MCHC 32.9 33.0  RDW 14.2 14.4  LYMPHSABS 1.3  --   MONOABS 0.7  --   EOSABS 0.2  --   BASOSABS 0.0  --     HEMOGLOBIN A1C Lab Results  Component Value Date   HGBA1C 6.3* 05/04/2014    Cardiac Panel (last 3  results)  Recent Labs  05/07/14 1235 05/07/14 1940 05/08/14 0317  CKTOTAL 1479* 1129* 868*  CKMB 190.7* 121.5* 71.4*  TROPONINI >20.00* >20.00* >20.00*  RELINDX 12.9* 10.8* 8.2*    BNP (last 3 results) No results found for this basename: PROBNP,  in the last 8760 hours  TSH  Recent Labs  05/07/14 1235  TSH 1.670    CHOLESTEROL  Recent Labs  05/07/14 0530 05/07/14 1235  CHOL 116 134    Hepatic Function Panel No results found for this basename: PROT, ALBUMIN, AST, ALT, ALKPHOS, BILITOT, BILIDIR, IBILI,  in the last 8760 hours  Scheduled Meds: . amLODipine  5 mg Oral Daily  . aspirin  81 mg Oral Daily  . atenolol  25 mg Oral BID  . darifenacin  7.5 mg Oral Daily  . finasteride  5 mg Oral Daily  . hydrochlorothiazide  25 mg Oral Daily  . insulin aspart  0-9 Units Subcutaneous TID WC  . latanoprost  1 drop Both Eyes QHS  . losartan  100 mg Oral Daily  . pneumococcal 23 valent vaccine  0.5 mL Intramuscular Tomorrow-1000  . prasugrel  10 mg Oral Daily  . sodium chloride  3 mL Intravenous Q12H  . timolol  1 drop Both Eyes Daily   Continuous Infusions:  PRN Meds:.sodium chloride, acetaminophen, diazepam, fentaNYL, hydrALAZINE, nitroGLYCERIN, ondansetron (ZOFRAN) IV, oxyCODONE-acetaminophen, sodium chloride   Cardiac Studies: Echo 05/07/2014: TTE:  LVEF 20-25%. Entire anterior wall and septal wall akinesis, mild to mod AI, eccentric. Mild aortic root dilatation. No evidence of dissection. No flap. No echodensities that is suspicious for Type I dissection:   EKG 05/07/2014 : Sinus with first degree, LAD, LAFB, RBBB.  ST elevation replaced by T inversion in anterolateral leads suggestive of evolution of ST elevation MI. Cannot exclude anterolateral ischemia.  EKG 05/08/2014: SR with 1st degree AV Block. LAD, LAFB, RBBB. Anterolateral subendocardial infarct. Extensive.   Assessment/Plan:  1. Acute anterior STEMI S/P Primary PCI with 4x28 mm Xience DES. 2. Aortic valve  regurgitation. 3. Hypertension 4. Hyperglycemia/  Rec:His presentation with chest pain/back pain is his angina and yesterdays pain was due to ongoing myocardial necrosis. Large MI.  D/C Amlodipine and atenolol. Start Coreg. Step down and cardiac rehab Phase I.   Pamella PertGANJI,JAGADEESH R, M.D. 05/08/2014, 7:57 AM [facility] Pager: [phone] Office: [phone] If no answer: [phone]

## 2014-05-09 LAB — GLUCOSE, CAPILLARY
GLUCOSE-CAPILLARY: 137 mg/dL — AB (ref 70–99)
Glucose-Capillary: 113 mg/dL — ABNORMAL HIGH (ref 70–99)
Glucose-Capillary: 132 mg/dL — ABNORMAL HIGH (ref 70–99)
Glucose-Capillary: 187 mg/dL — ABNORMAL HIGH (ref 70–99)

## 2014-05-09 LAB — TROPONIN I
Troponin I: 14.78 ng/mL (ref <0.30)
Troponin I: 10.72 ng/mL (ref <0.30)
Troponin I: 12.32 ng/mL (ref <0.30)

## 2014-05-09 MED ORDER — ISOSORBIDE MONONITRATE ER 60 MG PO TB24
60.0000 mg | ORAL_TABLET | Freq: Every day | ORAL | Status: DC
  Administered 2014-05-09 – 2014-05-11 (×3): 60 mg via ORAL
  Filled 2014-05-09 (×3): qty 1

## 2014-05-09 MED ORDER — CARVEDILOL 12.5 MG PO TABS
12.5000 mg | ORAL_TABLET | Freq: Two times a day (BID) | ORAL | Status: DC
  Administered 2014-05-10 – 2014-05-11 (×3): 12.5 mg via ORAL
  Filled 2014-05-09 (×6): qty 1

## 2014-05-09 MED ORDER — HYDRALAZINE HCL 25 MG PO TABS
25.0000 mg | ORAL_TABLET | Freq: Three times a day (TID) | ORAL | Status: DC
  Administered 2014-05-09 – 2014-05-11 (×4): 25 mg via ORAL
  Filled 2014-05-09 (×9): qty 1

## 2014-05-09 NOTE — Progress Notes (Addendum)
CARDIAC REHAB PHASE I   PRE:  Rate/Rhythm: 58 SB with PVC    BP: sitting 139/42    SaO2: 97 2L  MODE:  Ambulation: 500 ft   POST:  Rate/Rhythm: 74 SR    BP: sitting 164/44     SaO2: 96 RA  Pt able to get OOB and walk fairly independently. Knee pain better today. D/c'd O2 and SaO2 maintained 90s. SOB better today, only mild. To recliner, left off O2.  Discussed HF, low sodium, daily wts, ex and NTG with pt. Pt eats out most of his meals, does not cook. Daughter present and is supportive but also allows pt to be independent. Pt overwhelmed with diet change. Gets distracted easily in conversation on nonessentials. Expect low compliance. Pt not interested in tobacco cessation. Did not discuss CRPII.  [id number]  John Mason, John Mason CES, ACSM 05/09/2014 10:39 AM

## 2014-05-09 NOTE — Progress Notes (Signed)
Report called to receiving RN, Hazy.  All questions answered.  Patient will be transported via wheelchair on 2L [place] to new room location 3N room 2.  Patient's daughter at bedside and aware of transfer and new room location.  Belongings with daughter.  Meds and chart sent with patient.   Keitha ButteSMITH, EMILEE A, RN

## 2014-05-09 NOTE — Progress Notes (Signed)
Pt Right radial access level 1 at site and level 2 on right forearm with ecchymosis. Pt has no c/o pain, Dr. paged, will continue to monitor patient.

## 2014-05-09 NOTE — Progress Notes (Signed)
Subjective:  Back pain has significantly improved. No dyspnea. Feels tired. Needed Hydralazine twice last night for SBP > 130 mm Hg.  Objective:  Vital Signs in the last 24 hours: Temp:  [97.5 F (36.4 C)-99.5 F (37.5 C)] 98.6 F (37 C) (06/17 0800) Pulse Rate:  [53-73] 56 (06/17 0800) Resp:  [9-25] 24 (06/17 0700) BP: (110-156)/(16-65) 153/33 mmHg (06/17 0800) SpO2:  [94 %-100 %] 97 % (06/17 0800)  Intake/Output from previous day: 06/16 0701 - 06/17 0700 In: 600 [P.O.:600] Out: 1420 [Urine:1420]  Physical Exam:   General appearance: alert, cooperative, appears stated age and no distress Eyes: negative findings: lids and lashes normal Neck: no carotid bruit, no JVD, supple, symmetrical, trachea midline and thyroid not enlarged, symmetric, no tenderness/mass/nodules Neck: JVP - normal, carotids 2+= without bruits Resp: clear to auscultation bilaterally and good air entry Chest wall: no tenderness Cardio: S1, S2 normal. Long Early diastolic murmur present II/IV in the left sternal border GI: soft, non-tender; bowel sounds normal; no masses,  no organomegaly Extremities: extremities normal, atraumatic, no cyanosis or edema and normal bounding pulses bilateral extremities. Right radial site ecchymosis noted.  Lab Results: BMP  Recent Labs  05/07/14 0438 05/07/14 1244 05/08/14 0317  NA 143 141 137  K 4.1 4.9 4.0  CL 104 105 100  CO2 22 16* 21  GLUCOSE 214* 127* 128*  BUN 28* 26* 22  CREATININE 1.30 1.07 0.98  CALCIUM 9.7 9.5 8.9  GFRNONAA 49* 62* 74*  GFRAA 57* 72* 86*    CBC  Recent Labs Lab 05/07/14 0438 05/08/14 0317  WBC 11.1* 12.2*  RBC 4.57 4.14*  HGB 13.6 12.2*  HCT 41.4 37.0*  PLT 125* 118*  MCV 90.6 89.4  MCH 29.8 29.5  MCHC 32.9 33.0  RDW 14.2 14.4  LYMPHSABS 1.3  --   MONOABS 0.7  --   EOSABS 0.2  --   BASOSABS 0.0  --     HEMOGLOBIN A1C Lab Results  Component Value Date   HGBA1C 6.3* 05/04/2014    Cardiac Panel (last 3  results)  Recent Labs  05/07/14 1235 05/07/14 1940 05/08/14 0317 05/08/14 0852 05/08/14 1350 05/08/14 2147  CKTOTAL 1479* 1129* 868*  --   --   --   CKMB 190.7* 121.5* 71.4*  --   --   --   TROPONINI >20.00* >20.00* >20.00* >20.00* 18.90* >20.00*  RELINDX 12.9* 10.8* 8.2*  --   --   --     BNP (last 3 results) No results found for this basename: PROBNP,  in the last 8760 hours  TSH  Recent Labs  05/07/14 1235  TSH 1.670    CHOLESTEROL  Recent Labs  05/07/14 0530 05/07/14 1235  CHOL 116 134    Hepatic Function Panel No results found for this basename: PROT, ALBUMIN, AST, ALT, ALKPHOS, BILITOT, BILIDIR, IBILI,  in the last 8760 hours  Scheduled Meds: . aspirin  81 mg Oral Daily  . carvedilol  12.5 mg Oral BID WC  . darifenacin  7.5 mg Oral Daily  . finasteride  5 mg Oral Daily  . hydrochlorothiazide  25 mg Oral Daily  . insulin aspart  0-9 Units Subcutaneous TID WC  . isosorbide mononitrate  60 mg Oral Daily  . latanoprost  1 drop Both Eyes QHS  . losartan  100 mg Oral Daily  . prasugrel  10 mg Oral Daily  . sodium chloride  3 mL Intravenous Q12H  . timolol  1 drop Both Eyes  Daily   Continuous Infusions:  PRN Meds:.sodium chloride, acetaminophen, diazepam, fentaNYL, hydrALAZINE, nitroGLYCERIN, ondansetron (ZOFRAN) IV, oxyCODONE-acetaminophen, sodium chloride   Cardiac Studies: Echo 05/07/2014: TTE: LVEF 20-25%. Entire anterior wall and septal wall akinesis, mild to mod AI, eccentric. Mild aortic root dilatation. No evidence of dissection. No flap. No echodensities that is suspicious for Type I dissection:   EKG 05/07/2014   : Sinus with first degree, LAD, LAFB, RBBB.  ST elevation replaced by T inversion in anterolateral leads suggestive of evolution of ST elevation MI. Cannot exclude anterolateral ischemia.  EKG 05/08/2014, and 05/09/2014: SR with 1st degree AV Block. LAD, LAFB, RBBB. Anterolateral subendocardial infarct. Extensive. Prolonged  QT  Assessment/Plan:  1. Acute anterior STEMI S/P Primary PCI with 4x28 mm Xience DES. 2. Aortic valve regurgitation. 3. Hypertension 4. Hyperglycemia 5. Acute systolic heart failure  Rec: Transfer to tele. Plan to discharge in AM. Home health care needs. OV in 10 days and OP Cardiac rehab in 2 weeks. Discussed physical activity with the patient and his daughter. Add Hydralazine and Imdur PO.   Pamella PertGANJI,JAGADEESH R, M.D. 05/09/2014, 9:17 AM [facility] Pager: [phone] Office: [phone] If no answer: [phone]

## 2014-05-10 LAB — GLUCOSE, CAPILLARY
GLUCOSE-CAPILLARY: 134 mg/dL — AB (ref 70–99)
GLUCOSE-CAPILLARY: 130 mg/dL — AB (ref 70–99)
Glucose-Capillary: 113 mg/dL — ABNORMAL HIGH (ref 70–99)
Glucose-Capillary: 112 mg/dL — ABNORMAL HIGH (ref 70–99)

## 2014-05-10 LAB — TROPONIN I
TROPONIN I: 8.62 ng/mL — AB (ref <0.30)
TROPONIN I: 5.58 ng/mL — AB (ref <0.30)
TROPONIN I: 5 ng/mL — AB (ref <0.30)

## 2014-05-10 MED ORDER — HEART ATTACK BOUNCING BOOK
Freq: Once | Status: AC
  Administered 2014-05-10: 1
  Filled 2014-05-10: qty 1

## 2014-05-10 NOTE — Progress Notes (Signed)
Subjective:  Back pain has significantly improved. No dyspnea. Wants to go home.  Objective:  Vital Signs in the last 24 hours: Temp:  [97.7 F (36.5 C)-98.1 F (36.7 C)] 97.7 F (36.5 C) (06/18 1331) Pulse Rate:  [61-64] 61 (06/18 1331) Resp:  [16] 16 (06/18 1331) BP: (116-141)/(31-62) 116/31 mmHg (06/18 1331) SpO2:  [95 %-98 %] 98 % (06/18 1331) Weight:  [77.384 kg (170 lb 9.6 oz)] 77.384 kg (170 lb 9.6 oz) (06/18 0405)  Intake/Output from previous day: 06/17 0701 - 06/18 0700 In: 540 [P.O.:540] Out: 300 [Urine:300]  Physical Exam:   General appearance: alert, cooperative, appears stated age and no distress Eyes: negative findings: lids and lashes normal Neck: no carotid bruit, no JVD, supple, symmetrical, trachea midline and thyroid not enlarged, symmetric, no tenderness/mass/nodules Neck: JVP - normal, carotids 2+= without bruits Resp: clear to auscultation bilaterally and good air entry Chest wall: no tenderness Cardio: S1, S2 normal. Long Early diastolic murmur present II/IV in the left sternal border GI: soft, non-tender; bowel sounds normal; no masses,  no organomegaly Extremities: extremities normal, atraumatic, no cyanosis or edema and normal bounding pulses bilateral extremities. Right radial site ecchymosis noted.  Lab Results: BMP  Recent Labs  05/07/14 0438 05/07/14 1244 05/08/14 0317  NA 143 141 137  K 4.1 4.9 4.0  CL 104 105 100  CO2 22 16* 21  GLUCOSE 214* 127* 128*  BUN 28* 26* 22  CREATININE 1.30 1.07 0.98  CALCIUM 9.7 9.5 8.9  GFRNONAA 49* 62* 74*  GFRAA 57* 72* 86*    CBC  Recent Labs Lab 05/07/14 0438 05/08/14 0317  WBC 11.1* 12.2*  RBC 4.57 4.14*  HGB 13.6 12.2*  HCT 41.4 37.0*  PLT 125* 118*  MCV 90.6 89.4  MCH 29.8 29.5  MCHC 32.9 33.0  RDW 14.2 14.4  LYMPHSABS 1.3  --   MONOABS 0.7  --   EOSABS 0.2  --   BASOSABS 0.0  --     HEMOGLOBIN A1C Lab Results  Component Value Date   HGBA1C 6.3* 05/04/2014    Cardiac Panel  (last 3 results)  Recent Labs  05/07/14 1235 05/07/14 1940 05/08/14 0317  05/09/14 2146 05/10/14 0535 05/10/14 1448  CKTOTAL 1479* 1129* 868*  --   --   --   --   CKMB 190.7* 121.5* 71.4*  --   --   --   --   TROPONINI >20.00* >20.00* >20.00*  < > 12.32* 8.62* 5.58*  RELINDX 12.9* 10.8* 8.2*  --   --   --   --   < > = values in this interval not displayed.  BNP (last 3 results) No results found for this basename: PROBNP,  in the last 8760 hours  TSH  Recent Labs  05/07/14 1235  TSH 1.670    CHOLESTEROL  Recent Labs  05/07/14 0530 05/07/14 1235  CHOL 116 134    Hepatic Function Panel No results found for this basename: PROT, ALBUMIN, AST, ALT, ALKPHOS, BILITOT, BILIDIR, IBILI,  in the last 8760 hours  Scheduled Meds: . aspirin  81 mg Oral Daily  . carvedilol  12.5 mg Oral BID WC  . darifenacin  7.5 mg Oral Daily  . finasteride  5 mg Oral Daily  . hydrALAZINE  25 mg Oral 3 times per day  . hydrochlorothiazide  25 mg Oral Daily  . insulin aspart  0-9 Units Subcutaneous TID WC  . isosorbide mononitrate  60 mg Oral Daily  . latanoprost  1 drop Both Eyes QHS  . losartan  100 mg Oral Daily  . prasugrel  10 mg Oral Daily  . sodium chloride  3 mL Intravenous Q12H  . timolol  1 drop Both Eyes Daily   Continuous Infusions:  PRN Meds:.sodium chloride, acetaminophen, diazepam, fentaNYL, nitroGLYCERIN, ondansetron (ZOFRAN) IV, oxyCODONE-acetaminophen, sodium chloride   Cardiac Studies: Echo 05/07/2014: TTE: LVEF 20-25%. Entire anterior wall and septal wall akinesis, mild to mod AI, eccentric. Mild aortic root dilatation. No evidence of dissection. No flap. No echodensities that is suspicious for Type I dissection:   EKG 05/07/2014   : Sinus with first degree, LAD, LAFB, RBBB.  ST elevation replaced by T inversion in anterolateral leads suggestive of evolution of ST elevation MI. Cannot exclude anterolateral ischemia.  EKG 05/08/2014, and 05/09/2014: SR with 1st degree AV  Block. LAD, LAFB, RBBB. Anterolateral subendocardial infarct. Extensive. Prolonged QT  Assessment/Plan:  1. Acute anterior STEMI S/P Primary PCI with 4x28 mm Xience DES. 2. Aortic valve regurgitation. 3. Hypertension well controlled 4. Hyperglycemia 5. Acute systolic and diastolic heart failure  Rec: Patient evaluated by PT and no special needs, patient not in acute distress, but has mild dyspnea on exertion. Home in the morning if not felt to be a SNF candidate.  Pamella PertGANJI,JAGADEESH R, M.D. 05/10/2014, 5:36 PM [facility] Pager: [phone] Office: [phone] If no answer: [phone]

## 2014-05-10 NOTE — Progress Notes (Signed)
CARDIAC REHAB PHASE I   PRE:  Rate/Rhythm: 57 SB    BP: sitting 122/36    SaO2: 93 RA  MODE:  Ambulation: 500 ft   POST:  Rate/Rhythm: 68    BP: sitting 150/40     SaO2: 96 RA  Pt frustrated that he can't walk independently or get out of bed alone. Pt able to stand independently but wanted to hold to hand railing in hall. Encouraged pt to walk without it since he would not have it at home. (pt had declined RW yesterday). Slightly unsteady with lack of attention to right side. Got very close to objects on right while walking. Difficult to communicate with pt at times due to his frustrations. Pt SOB at halfway but declined rest. Return to recliner after walk. Noted PT to eval.   [id number] Elissa LovettReeve, Mirjana Tar[place] Valley ViewKristan CES, ACSM 05/10/2014 11:38 AM

## 2014-05-10 NOTE — Evaluation (Addendum)
Physical Therapy Evaluation and Discharge Patient Details Name: John Mason MRN: [id number] DOB: [birth year] Today's Date: 05/10/2014   History of Present Illness  Admitted with extreme back pain.  Found to have suffered a STEMI.  [facility] and would benefit post d/c from OP Cardiac Rehab.  Clinical Impression  Patient evaluated by Physical Therapy with no further acute PT needs identified. Basic cardiac education discussed.  Pt would benefit from OP Cardiac Rehab.   PT is signing off. Thank you for this referral.     Follow Up Recommendations No PT follow up;Other (comment) (CRP2 would be beneficial)    Equipment Recommendations  None recommended by PT    Recommendations for Other Services       Precautions / Restrictions        Mobility  Bed Mobility Overal bed mobility: Modified Independent                Transfers Overall transfer level: Modified independent                  Ambulation/Gait Ambulation/Gait assistance: Supervision Ambulation Distance (Feet): 350 Feet Assistive device: None Gait Pattern/deviations: WFL(Within Functional Limits) Gait velocity: functional speed Gait velocity interpretation: at or above normal speed for age/gender General Gait Details: generally steady with occasional drift L and R  Stairs Stairs: Yes Stairs assistance: Modified independent (Device/Increase time) Stair Management: One rail Right;Alternating pattern;Backwards Number of Stairs: 5 General stair comments: steady with rails  Wheelchair Mobility    Modified Rankin (Stroke Patients Only)       Balance Overall balance assessment: No apparent balance deficits (not formally assessed) (mild drift with gait, solid balance statically)                                           Pertinent Vitals/Pain Minimal dyspnea with exertion.    Home Living Family/patient expects to be discharged to:: Private residence Living  Arrangements: Alone Available Help at Discharge: Other (Comment) (going to his daughter's home for a couple days)                  Prior Function Level of Independence: Independent               Hand Dominance        Extremity/Trunk Assessment               Lower Extremity Assessment: Overall WFL for tasks assessed         Communication   Communication: No difficulties  Cognition Arousal/Alertness: Awake/alert Behavior During Therapy: WFL for tasks assessed/performed Overall Cognitive Status: Within Functional Limits for tasks assessed                      General Comments      Exercises        Assessment/Plan    PT Assessment Patent does not need any further PT services  PT Diagnosis     PT Problem List Decreased activity tolerance  PT Treatment Interventions     PT Goals (Current goals can be found in the Care Plan section) Acute Rehab PT Goals PT Goal Formulation: No goals set, d/c therapy    Frequency     Barriers to discharge Decreased caregiver support      Co-evaluation  End of Session   Activity Tolerance: Patient tolerated treatment well Patient left: in chair;with call bell/phone within reach Nurse Communication: Mobility status         Time: [id number] PT Time Calculation (min): 17 min   Charges:   PT Evaluation $Initial PT Evaluation Tier I: 1 Procedure PT Treatments $Gait Training: 8-22 mins   PT G Codes:          Mottinger, Eliseo GumKenneth V 05/10/2014, 5:16 PM 05/10/2014  Santel BingKen Mottinger, PT [phone]  (pager)

## 2014-05-11 LAB — TROPONIN I: Troponin I: 4.6 ng/mL (ref <0.30)

## 2014-05-11 LAB — GLUCOSE, CAPILLARY
GLUCOSE-CAPILLARY: 121 mg/dL — AB (ref 70–99)
GLUCOSE-CAPILLARY: 117 mg/dL — AB (ref 70–99)

## 2014-05-11 MED ORDER — CARVEDILOL 12.5 MG PO TABS: 12.5000 mg | ORAL_TABLET | Freq: Two times a day (BID) | ORAL | Status: DC

## 2014-05-11 MED ORDER — ISOSORBIDE MONONITRATE ER 60 MG PO TB24: 60.0000 mg | ORAL_TABLET | Freq: Every day | ORAL | Status: DC

## 2014-05-11 MED ORDER — NITROGLYCERIN 0.4 MG SL SUBL: 0.4000 mg | SUBLINGUAL_TABLET | SUBLINGUAL | Status: DC | PRN

## 2014-05-11 MED ORDER — PRASUGREL HCL 10 MG PO TABS: 10.0000 mg | ORAL_TABLET | Freq: Every day | ORAL | Status: DC

## 2014-05-11 MED ORDER — HYDRALAZINE HCL 25 MG PO TABS: 25.0000 mg | ORAL_TABLET | Freq: Three times a day (TID) | ORAL | Status: DC

## 2014-05-11 MED ORDER — ASPIRIN 81 MG PO CHEW: 81.0000 mg | CHEWABLE_TABLET | Freq: Every day | ORAL | Status: DC

## 2014-05-11 NOTE — Progress Notes (Signed)
Pt walking independently. Discussed [facility] and pt interested. [facility] [phone] Ethelda ChickKristan Reeve CES, ACSM 10:31 AM 05/11/2014

## 2014-05-11 NOTE — Discharge Summary (Signed)
Physician Discharge Summary  Patient ID: John Mason MRN: [id number] DOB/AGE: [birth year] 80 y.o.  Admit date: 05/07/2014 Discharge date: 05/11/2014  Primary Discharge Diagnosis 1. Acute anterior STEMI S/P Primary PCI with 4x28 mm Xience DES. CAD of the native vessels. Stenting of the proximal and midsegment of the LAD with implantation of a 4.0 x 28 mm Xience Alpine DES.   Secondary Discharge Diagnosis 2. Aortic valve regurgitation moderate to severe needs surveillance as outpatient.  3. Hypertension well controlled  4. Hyperglycemia  5. Acute systolic and diastolic heart failure,  TTE: LVEF 20-25%. Entire anterior wall and septal wall akinesis, mild to mod AI, eccentric.    Significant Diagnostic Studies: 05/08/2014: Hemodynamic data:  Left ventricular pressure was 134/16 with LVEDP of 18 mm mercury. Aortic pressure was 136/64 with a mean of 95 mm mercury. There was no pressure gradient across the aortic valve.  Left ventricle: Performed in the RAO projection revealed LVEF of 20-25%. There was No significant MR. entire anterolateral wall a candidate. Suboptimal study to evaluate mitral regurgitation due to hand contrast injection.  Right coronary artery: The vessel is very large caliber vessel, mild calcification is evident, it is Dominant.  Left main coronary artery is large and mild calcified.  Circumflex coronary artery: A large vessel giving origin to a large obtuse marginal 1. Mild diffuse disease is evident, ostium of the circumflex coronary artery has mild calcification.  Ramus intermediate: Moderate to large caliber vessel. Mild disease  LAD: LAD gives origin to a large diagonal-1. Mildly calcified. Just after the origin of the diagonal 1 there is a ulcerated high-grade thrombotic 90% stenosis. There is mild luminal irregularity, the diagonal 1 which is large and has a ostial 20-30% stenosis. Rest of the LAD is large calibered giving origin to a moderate sized D2 and mild diffuse  luminal irregularity.  Ascending aortogram: There was no adequate dilatation. There was no significant aortic regurgitation. No obvious dissection.  Impression: Single vessel coronary artery disease involving the mid LAD, however the proximal and midsegment LAD also shows mild luminal irregularity hence the stent was placed into the proximal and also midsegment of the LAD.  Interventional data: Successful PTCA and stenting of the proximal and midsegment of the LAD with implantation of a 4.0 x 28 mm Xience Alpine DES. Will need Dual antiplatelet therapy with Effient and ASA 81 mg for at least one year and aspirin indefinitely.   Cardiac Studies: Echo 05/07/2014: TTE: LVEF 20-25%. Entire anterior wall and septal wall akinesis, mild to mod AI, eccentric. Mild aortic root dilatation. No evidence of dissection. No flap. No echodensities that is suspicious for Type I dissection:  Hospital Course: Patient is a 80 year old Caucasian male with history of hypertension, fairly active gentleman no history of tobacco use disorder, lives independently, admitted with severe back pain, between the shoulder blades, EKG revealing ST elevation myocardial infarction and anterior wall.  He was emergently taken to the cardiac catheterization lab and underwent successful angioplasty to the LAD.  Patient had a very large myocardial infarction.  Echocardiogram revealed severely depressed left ventricle systolic function  And moderate if not moderately severe aortic regurgitation.  There was a suspicion of aortic dissection as patient complained of back pain, but this was his presentation with angina pectoris.  The aortic root appeared to be mildly dilated however there was no evidence of dissection on the 2-D echocardiogram which had good visualization, chest x-ray revealing no evidence of mediastinal enlargement.  Patient's recovery was delayed due to  his age, massive large anterior wall myocardial infarction and severely  depressed LVEF.  He was also in acute systolic heart failure during hospitalization and he was gently diuresed.after 4 days of hospital stay, after patient was evaluated by physical therapy and cardiac rehabilitation, patient was felt stable for discharge.  On the day of discharge, patient while in the room suddenly felt dizzy when he was about to be discharged, his blood pressure was borderline low, hence hydralazine which was previously started was discontinued, patient was observed for 3 more hours, ambulated without any difficulty or recurrence of symptoms and hence felt stable for discharge.  Patient will be followed up in the outpatient basis in the next one week to 10 days.    Discharge Exam: Blood pressure 110/41, pulse 56, temperature 97.3 F (36.3 C), temperature source Oral, resp. rate 17, height 5\' 9"  (1.753 m), weight 77.792 kg (171 lb 8 oz), SpO2 99.00%.   General appearance: alert, cooperative, appears stated age and no distress  Eyes: negative findings: lids and lashes normal  Neck: no carotid bruit, no JVD, supple, symmetrical, trachea midline and thyroid not enlarged, symmetric, no tenderness/mass/nodules  Neck: JVP - normal, carotids 2+= without bruits  Resp: clear to auscultation bilaterally and good air entry  Chest wall: no tenderness  Cardio: S1, S2 normal. Long Early diastolic murmur present II/IV in the left sternal border  GI: soft, non-tender; bowel sounds normal; no masses, no organomegaly  Extremities: extremities normal, atraumatic, no cyanosis or edema and normal bounding pulses bilateral extremities. Right radial site ecchymosis noted.  Labs:   Lab Results  Component Value Date   WBC 12.2* 05/08/2014   HGB 12.2* 05/08/2014   HCT 37.0* 05/08/2014   MCV 89.4 05/08/2014   PLT 118* 05/08/2014    Recent Labs Lab 05/08/14 0317  NA 137  K 4.0  CL 100  CO2 21  BUN 22  CREATININE 0.98  CALCIUM 8.9  GLUCOSE 128*   Lab Results  Component Value Date   CKTOTAL  868* 05/08/2014   CKMB 71.4* 05/08/2014   TROPONINI 4.60* 05/11/2014    Lipid Panel     Component Value Date/Time   CHOL 134 05/07/2014 1235   TRIG 88 05/07/2014 1235   HDL 53 05/07/2014 1235   HDL 51 03/24/2013 1152   CHOLHDL 2.5 05/07/2014 1235   CHOLHDL 2.5 03/24/2013 1152   VLDL 18 05/07/2014 1235   LDLCALC 63 05/07/2014 1235   LDLCALC 65 03/24/2013 1152   Cardiac Panel (last 3 results)  Recent Labs  05/10/14 1448 05/10/14 2230 05/11/14 0612  TROPONINI 5.58* 5.00* 4.60*    EKG 05/08/2014, and 05/09/2014: SR with 1st degree AV Block. LAD, LAFB, RBBB. Anterolateral subendocardial infarct. Extensive. Prolonged QT   Radiology: Dg Chest Port 1v Same Day  05/07/2014   CLINICAL DATA:  mediastinal enlargement ; acute myocardial infarction  EXAM: PORTABLE CHEST - 1 VIEW SAME DAY  COMPARISON:  None.  FINDINGS: There is mild left base atelectasis. Lungs elsewhere are clear. Heart is mildly enlarged with pulmonary vascularity within normal limits. The mediastinum does not appear enlarged on this semi rec portable examination. There is no appreciable adenopathy. No bone lesions.  IMPRESSION: The heart appears prominent. Mild left base atelectasis. Mediastinum does not appear enlarged on this portable examination. If there remains concern for potential mediastinal pathology, chest CT with intravenous contrast would be advised to further evaluate.   Electronically Signed   By: Bretta BangWilliam  Woodruff M.D.   On: 05/07/2014 12:10  FOLLOW UP PLANS AND APPOINTMENTS Discharge Instructions   Amb Referral to Cardiac Rehabilitation    Complete by:  As directed             Medication List    STOP taking these medications       amLODipine 5 MG tablet  Commonly known as:  NORVASC     atenolol 50 MG tablet  Commonly known as:  TENORMIN      TAKE these medications       aspirin 81 MG chewable tablet  Chew 1 tablet (81 mg total) by mouth daily.     carvedilol 12.5 MG tablet  Commonly known as:   COREG  Take 1 tablet (12.5 mg total) by mouth 2 (two) times daily with a meal.     finasteride 5 MG tablet  Commonly known as:  PROSCAR  Take 5 mg by mouth daily. For Prostate     isosorbide mononitrate 60 MG 24 hr tablet  Commonly known as:  IMDUR  Take 1 tablet (60 mg total) by mouth daily.     latanoprost 0.005 % ophthalmic solution  Commonly known as:  XALATAN  Place 1 drop into both eyes at bedtime.     losartan-hydrochlorothiazide 100-25 MG per tablet  Commonly known as:  HYZAAR  Take 1 tablet by mouth daily. For blood pressure     nitroGLYCERIN 0.4 MG SL tablet  Commonly known as:  NITROSTAT  Place 1 tablet (0.4 mg total) under the tongue every 5 (five) minutes as needed for chest pain.     prasugrel 10 MG Tabs tablet  Commonly known as:  EFFIENT  Take 1 tablet (10 mg total) by mouth daily.     solifenacin 10 MG tablet  Commonly known as:  VESICARE  Take 10 mg by mouth daily. For bladder control     timolol 0.5 % ophthalmic solution  Commonly known as:  TIMOPTIC  Place 1 drop into both eyes daily.           Follow-up Information   Follow up with Earl ManyVyas, Chandra K, MD On 05/22/2014. (3:30 pm)    Specialty:  Cardiology   Contact information:   [facility] [address] [place] [address] [phone]        Pamella PertGANJI,JAGADEESH R, MD 05/11/2014, 1:58 PM  Pager: [phone] Office: [phone] If no answer: [phone]

## 2014-05-13 ENCOUNTER — Other Ambulatory Visit: Payer: Self-pay | Admitting: Internal Medicine

## 2014-05-22 ENCOUNTER — Encounter (HOSPITAL_COMMUNITY): Payer: Self-pay | Admitting: Emergency Medicine

## 2014-05-22 ENCOUNTER — Inpatient Hospital Stay (HOSPITAL_COMMUNITY)
Admission: EM | Admit: 2014-05-22 | Discharge: 2014-05-24 | DRG: 690 | Disposition: A | Discharge status: Home / Self Care | Payer: Medicare HMO | Attending: Internal Medicine | Admitting: Internal Medicine

## 2014-05-22 DIAGNOSIS — E559 Vitamin D deficiency, unspecified: Secondary | ICD-10-CM

## 2014-05-22 DIAGNOSIS — R739 Hyperglycemia, unspecified: Secondary | ICD-10-CM

## 2014-05-22 DIAGNOSIS — I359 Nonrheumatic aortic valve disorder, unspecified: Secondary | ICD-10-CM | POA: Diagnosis present

## 2014-05-22 DIAGNOSIS — N390 Urinary tract infection, site not specified: Principal | ICD-10-CM | POA: Diagnosis present

## 2014-05-22 DIAGNOSIS — I2510 Atherosclerotic heart disease of native coronary artery without angina pectoris: Secondary | ICD-10-CM | POA: Diagnosis present

## 2014-05-22 DIAGNOSIS — N3941 Urge incontinence: Secondary | ICD-10-CM

## 2014-05-22 DIAGNOSIS — R001 Bradycardia, unspecified: Secondary | ICD-10-CM | POA: Diagnosis present

## 2014-05-22 DIAGNOSIS — R05 Cough: Secondary | ICD-10-CM | POA: Diagnosis present

## 2014-05-22 DIAGNOSIS — R32 Unspecified urinary incontinence: Secondary | ICD-10-CM

## 2014-05-22 DIAGNOSIS — I498 Other specified cardiac arrhythmias: Secondary | ICD-10-CM | POA: Diagnosis present

## 2014-05-22 DIAGNOSIS — I252 Old myocardial infarction: Secondary | ICD-10-CM

## 2014-05-22 DIAGNOSIS — G252 Other specified forms of tremor: Secondary | ICD-10-CM

## 2014-05-22 DIAGNOSIS — I2109 ST elevation (STEMI) myocardial infarction involving other coronary artery of anterior wall: Secondary | ICD-10-CM

## 2014-05-22 DIAGNOSIS — H409 Unspecified glaucoma: Secondary | ICD-10-CM | POA: Diagnosis present

## 2014-05-22 DIAGNOSIS — E860 Dehydration: Secondary | ICD-10-CM | POA: Diagnosis present

## 2014-05-22 DIAGNOSIS — R7309 Other abnormal glucose: Secondary | ICD-10-CM | POA: Diagnosis present

## 2014-05-22 DIAGNOSIS — Z79899 Other long term (current) drug therapy: Secondary | ICD-10-CM

## 2014-05-22 DIAGNOSIS — Z9861 Coronary angioplasty status: Secondary | ICD-10-CM

## 2014-05-22 DIAGNOSIS — R059 Cough, unspecified: Secondary | ICD-10-CM | POA: Diagnosis present

## 2014-05-22 DIAGNOSIS — I10 Essential (primary) hypertension: Secondary | ICD-10-CM | POA: Diagnosis present

## 2014-05-22 DIAGNOSIS — G250 Essential tremor: Secondary | ICD-10-CM

## 2014-05-22 DIAGNOSIS — R55 Syncope and collapse: Secondary | ICD-10-CM | POA: Diagnosis present

## 2014-05-22 DIAGNOSIS — N179 Acute kidney failure, unspecified: Secondary | ICD-10-CM | POA: Diagnosis present

## 2014-05-22 DIAGNOSIS — I2589 Other forms of chronic ischemic heart disease: Secondary | ICD-10-CM | POA: Diagnosis present

## 2014-05-22 DIAGNOSIS — F17200 Nicotine dependence, unspecified, uncomplicated: Secondary | ICD-10-CM | POA: Diagnosis present

## 2014-05-22 DIAGNOSIS — Z7982 Long term (current) use of aspirin: Secondary | ICD-10-CM

## 2014-05-22 HISTORY — DX: ST elevation (STEMI) myocardial infarction of unspecified site: I21.3

## 2014-05-22 LAB — CBC
HCT: 32 % — ABNORMAL LOW (ref 39.0–52.0)
Hemoglobin: 10.5 g/dL — ABNORMAL LOW (ref 13.0–17.0)
MCH: 29.3 pg (ref 26.0–34.0)
MCHC: 32.8 g/dL (ref 30.0–36.0)
MCV: 89.4 fL (ref 78.0–100.0)
Platelets: 134 10*3/uL — ABNORMAL LOW (ref 150–400)
RBC: 3.58 MIL/uL — AB (ref 4.22–5.81)
RDW: 14.2 % (ref 11.5–15.5)
WBC: 7 10*3/uL (ref 4.0–10.5)

## 2014-05-22 LAB — BASIC METABOLIC PANEL
BUN: 23 mg/dL (ref 6–23)
CO2: 24 meq/L (ref 19–32)
Calcium: 8.9 mg/dL (ref 8.4–10.5)
Chloride: 101 mEq/L (ref 96–112)
Creatinine, Ser: 1.45 mg/dL — ABNORMAL HIGH (ref 0.50–1.35)
GFR calc Af Amer: 50 mL/min — ABNORMAL LOW (ref >90)
GFR, EST NON AFRICAN AMERICAN: 43 mL/min — AB (ref >90)
Glucose, Bld: 136 mg/dL — ABNORMAL HIGH (ref 70–99)
Potassium: 4.9 mEq/L (ref 3.7–5.3)
SODIUM: 138 meq/L (ref 137–147)

## 2014-05-22 LAB — URINALYSIS, ROUTINE W REFLEX MICROSCOPIC
BILIRUBIN URINE: NEGATIVE
Glucose, UA: NEGATIVE mg/dL
KETONES UR: NEGATIVE mg/dL
NITRITE: NEGATIVE
PROTEIN: NEGATIVE mg/dL
Specific Gravity, Urine: 1.016 (ref 1.005–1.030)
UROBILINOGEN UA: 0.2 mg/dL (ref 0.0–1.0)
pH: 5.5 (ref 5.0–8.0)

## 2014-05-22 LAB — TSH: TSH: 0.93 u[IU]/mL (ref 0.350–4.500)

## 2014-05-22 LAB — URINE MICROSCOPIC-ADD ON

## 2014-05-22 LAB — I-STAT TROPONIN, ED: TROPONIN I, POC: 0.06 ng/mL (ref 0.00–0.08)

## 2014-05-22 LAB — CBG MONITORING, ED: GLUCOSE-CAPILLARY: 142 mg/dL — AB (ref 70–99)

## 2014-05-22 MED ORDER — ACETAMINOPHEN 650 MG RE SUPP: 650.0000 mg | Freq: Four times a day (QID) | RECTAL | Status: DC | PRN

## 2014-05-22 MED ORDER — CARVEDILOL 12.5 MG PO TABS
12.5000 mg | ORAL_TABLET | Freq: Two times a day (BID) | ORAL | Status: DC
  Filled 2014-05-22 (×2): qty 1

## 2014-05-22 MED ORDER — ONDANSETRON HCL 4 MG PO TABS: 4.0000 mg | ORAL_TABLET | Freq: Four times a day (QID) | ORAL | Status: DC | PRN

## 2014-05-22 MED ORDER — LATANOPROST 0.005 % OP SOLN
1.0000 [drp] | Freq: Every day | OPHTHALMIC | Status: DC
  Administered 2014-05-22 – 2014-05-23 (×2): 1 [drp] via OPHTHALMIC
  Filled 2014-05-22: qty 2.5

## 2014-05-22 MED ORDER — ONDANSETRON HCL 4 MG/2ML IJ SOLN: 4.0000 mg | Freq: Four times a day (QID) | INTRAMUSCULAR | Status: DC | PRN

## 2014-05-22 MED ORDER — ACETAMINOPHEN 325 MG PO TABS: 650.0000 mg | ORAL_TABLET | Freq: Four times a day (QID) | ORAL | Status: DC | PRN

## 2014-05-22 MED ORDER — DEXTROSE 5 % IV SOLN
1.0000 g | INTRAVENOUS | Status: DC
  Administered 2014-05-22 – 2014-05-23 (×2): 1 g via INTRAVENOUS
  Filled 2014-05-22 (×3): qty 10

## 2014-05-22 MED ORDER — HYDRALAZINE HCL 25 MG PO TABS
25.0000 mg | ORAL_TABLET | Freq: Three times a day (TID) | ORAL | Status: DC
  Administered 2014-05-22 – 2014-05-24 (×5): 25 mg via ORAL
  Filled 2014-05-22 (×8): qty 1

## 2014-05-22 MED ORDER — TIMOLOL MALEATE 0.5 % OP SOLN
1.0000 [drp] | Freq: Every day | OPHTHALMIC | Status: DC
  Administered 2014-05-23 – 2014-05-24 (×2): 1 [drp] via OPHTHALMIC
  Filled 2014-05-22: qty 5

## 2014-05-22 MED ORDER — ASPIRIN 81 MG PO CHEW
81.0000 mg | CHEWABLE_TABLET | Freq: Every day | ORAL | Status: DC
  Administered 2014-05-23 – 2014-05-24 (×2): 81 mg via ORAL
  Filled 2014-05-22 (×2): qty 1

## 2014-05-22 MED ORDER — PRASUGREL HCL 10 MG PO TABS
10.0000 mg | ORAL_TABLET | Freq: Every day | ORAL | Status: DC
  Administered 2014-05-23 – 2014-05-24 (×2): 10 mg via ORAL
  Filled 2014-05-22 (×2): qty 1

## 2014-05-22 MED ORDER — HEPARIN SODIUM (PORCINE) 5000 UNIT/ML IJ SOLN
5000.0000 [IU] | Freq: Three times a day (TID) | INTRAMUSCULAR | Status: DC
  Administered 2014-05-22 – 2014-05-24 (×6): 5000 [IU] via SUBCUTANEOUS
  Filled 2014-05-22 (×9): qty 1

## 2014-05-22 MED ORDER — SODIUM CHLORIDE 0.9 % IV SOLN
INTRAVENOUS | Status: DC
  Administered 2014-05-22: 17:00:00 via INTRAVENOUS

## 2014-05-22 MED ORDER — TIMOLOL MALEATE 0.5 % OP SOLN: 1.0000 [drp] | Freq: Every day | OPHTHALMIC | Status: DC

## 2014-05-22 MED ORDER — NITROGLYCERIN 0.4 MG SL SUBL: 0.4000 mg | SUBLINGUAL_TABLET | SUBLINGUAL | Status: DC | PRN

## 2014-05-22 MED ORDER — SODIUM CHLORIDE 0.9 % IJ SOLN
3.0000 mL | Freq: Two times a day (BID) | INTRAMUSCULAR | Status: DC
  Administered 2014-05-23 – 2014-05-24 (×2): 3 mL via INTRAVENOUS

## 2014-05-22 MED ORDER — ISOSORBIDE MONONITRATE ER 60 MG PO TB24
60.0000 mg | ORAL_TABLET | Freq: Every day | ORAL | Status: DC
  Administered 2014-05-23 – 2014-05-24 (×2): 60 mg via ORAL
  Filled 2014-05-22 (×2): qty 1

## 2014-05-22 NOTE — ED Notes (Signed)
Family reports blood in urine Saturday. PCP give antibiotic by mouth and IM shot. Sent home with prescription. Denies blood in urine today

## 2014-05-22 NOTE — ED Notes (Addendum)
MD at bedside. Dr. "J" 

## 2014-05-22 NOTE — Consult Note (Signed)
CARDIOLOGY CONSULT NOTE  Patient ID: John Mason MRN: [id number] DOB/AGE: 78-13-1930 78 y.o.  Admit date: 05/22/2014 Referring Physician  [facility] Primary Physician:  Kimber Relic, MD Reason for Consultation  Syncope  HPI: patient is a fairly active 80 year old Caucasian male who was recently admitted with acute anterior ST elevation myocardial infarction when he presented with back pain on 05/07/2014. He underwent emergent coronary angiography and drug-eluting stent implantation to the proximal and midsegment of the LAD. He had severe left ventricle systolic dysfunction. His eventually discharged home on 05/11/2014.  2 days ago he had complained of micturition difficulties and also hematuria. He was evaluated in urgent care and was treated with steroid injection for his right hip and was also started on antibiotics for UTI. Since then he started to feel better and his hematuria had completely subsided. This afternoon while he was sitting in the porch with his daughter she noticed that he suddenly put his head down. This was followed by involuntary movements of his arms in the form of jerky motion. She could not wake him up, she is an inside and called EMS. The whole episode lasted for about 30 seconds, when she returned back, patient was awake and responded appropriately, although felt extremely fatigued and weak. He was then brought to the emergency room.  He did not have any chest pain, does have shortness of breath and generalized weakness since recent hemorrhage, history is also suggestive of orthopnea and Cheyne-Stokes breathing. No palpitations, no dizziness or syncope prior to the episode. Episode happened without any premonitory symptoms. No fever, chills, rigors, no leg edema, no recent weight changes.  His appetite has been low since MI.  Past Medical History  Diagnosis Date  . Urge incontinence   . Urinary frequency   . Unspecified vitamin D deficiency   . Contact dermatitis and other  eczema due to other specified agent   . Pain in joint, lower leg   . Cognitive deficits, late effect of cerebrovascular disease   . Essential and other specified forms of tremor   . Obesity, unspecified   . First degree atrioventricular block   . Abnormality of gait   . Other abnormal blood chemistry   . Unspecified glaucoma   . Hypertrophy of prostate without urinary obstruction and other lower urinary tract symptoms (LUTS)   . Impotence of organic origin   . Actinic keratosis   . Insomnia, unspecified   . Elevated prostate specific antigen (PSA)   . Dizziness and giddiness   . Gastric ulcer, unspecified as acute or chronic, without mention of hemorrhage, perforation, or obstruction   . Unspecified essential hypertension   . MI (myocardial infarction) 05/07/2014     Past Surgical History  Procedure Laterality Date  . Appendectomy    . Left knee cyst removed  1977  . Transurethral resection of prostate  1987     Family History  Problem Relation Age of Onset  . Stroke Mother   . Stroke Sister      Social History: History   Social History  . Marital Status: Married    Spouse Name: N/A    Number of Children: N/A  . Years of Education: N/A   Occupational History  . Not on file.   Social History Main Topics  . Smoking status: Light Tobacco Smoker    Types: Pipe  . Smokeless tobacco: Current User    Types: Snuff  . Alcohol Use: No  . Drug Use: No  . Sexual  Activity: No   Other Topics Concern  . Not on file   Social History Narrative  . No narrative on file     Prescriptions prior to admission  Medication Sig Dispense Refill  . aspirin 81 MG chewable tablet Chew 81 mg by mouth daily.      . carvedilol (COREG) 12.5 MG tablet Take 12.5 mg by mouth 2 (two) times daily with a meal.      . finasteride (PROSCAR) 5 MG tablet Take 5 mg by mouth daily. For Prostate      . isosorbide mononitrate (IMDUR) 60 MG 24 hr tablet Take 1 tablet (60 mg total) by mouth daily.   30 tablet  1  . latanoprost (XALATAN) 0.005 % ophthalmic solution Place 1 drop into both eyes at bedtime.       Marland Kitchen. levofloxacin (LEVAQUIN) 500 MG tablet Take 500 mg by mouth 2 (two) times daily. Qty # 5 filled 05/21/14      . losartan-hydrochlorothiazide (HYZAAR) 100-25 MG per tablet Take 1 tablet by mouth daily. For blood pressure      . prasugrel (EFFIENT) 10 MG TABS tablet Take 10 mg by mouth daily.      . solifenacin (VESICARE) 10 MG tablet Take 10 mg by mouth daily. For bladder control      . timolol (TIMOPTIC) 0.5 % ophthalmic solution Place 1 drop into both eyes daily.       . nitroGLYCERIN (NITROSTAT) 0.4 MG SL tablet Place 1 tablet (0.4 mg total) under the tongue every 5 (five) minutes as needed for chest pain.  30 tablet  12    Scheduled Meds: . aspirin  81 mg Oral Daily  . cefTRIAXone (ROCEPHIN)  IV  1 g Intravenous Q24H  . heparin  5,000 Units Subcutaneous 3 times per day  . hydrALAZINE  25 mg Oral 3 times per day  . [START ON 05/23/2014] isosorbide mononitrate  60 mg Oral Daily  . latanoprost  1 drop Both Eyes QHS  . [START ON 05/23/2014] prasugrel  10 mg Oral Daily  . sodium chloride  3 mL Intravenous Q12H  . [START ON 05/23/2014] timolol  1 drop Both Eyes Daily   Continuous Infusions: . sodium chloride 75 mL/hr at 05/22/14 1709   PRN Meds:.acetaminophen, acetaminophen, nitroGLYCERIN, ondansetron (ZOFRAN) IV, ondansetron  ROS: General: no fevers/chills/night sweats Eyes: no blurry vision, diplopia, or amaurosis ENT: no sore throat or hearing loss GI: no abdominal pain, nausea, vomiting, diarrhea, or constipation GU: no dysuria, frequency, or hematuria, symptoms have resolved since being on antibiotics Skin: no rash Neuro: no headache, numbness, tingling, or weakness of extremities Musculoskeletal: no joint pain or swelling Heme: no bleeding, DVT, or easy bruising Endo: no polydipsia or polyuria  other systems negative.  Physical Exam: Blood pressure 147/42, pulse 45,  temperature 97.8 F (36.6 C), temperature source Oral, resp. rate 18, height 5\' 9"  (1.753 m), weight 75 kg (165 lb 5.5 oz), SpO2 98.00%.   General appearance: alert, cooperative, appears stated age and no distress Lungs: clear to auscultation bilaterally Chest wall: no tenderness Heart: S1, S2 normal. Long Early diastolic murmur present II/IV in the left sternal border   Abdomen: soft, non-tender; bowel sounds normal; no masses,  no organomegaly Extremities: extremities normal, atraumatic, no cyanosis or edema Pulses: 2+ and symmetric Neurologic: Grossly normal  Labs:   Lab Results  Component Value Date   WBC 7.0 05/22/2014   HGB 10.5* 05/22/2014   HCT 32.0* 05/22/2014   MCV 89.4 05/22/2014  PLT 134* 05/22/2014    Recent Labs Lab 05/22/14 1330  NA 138  K 4.9  CL 101  CO2 24  BUN 23  CREATININE 1.45*  CALCIUM 8.9  GLUCOSE 136*   Lab Results  Component Value Date   CKTOTAL 868* 05/08/2014   CKMB 71.4* 05/08/2014   TROPONINI 4.60* 05/11/2014    Lipid Panel     Component Value Date/Time   CHOL 134 05/07/2014 1235   TRIG 88 05/07/2014 1235   HDL 53 05/07/2014 1235   HDL 51 03/24/2013 1152   CHOLHDL 2.5 05/07/2014 1235   CHOLHDL 2.5 03/24/2013 1152   VLDL 18 05/07/2014 1235   LDLCALC 63 05/07/2014 1235   LDLCALC 65 03/24/2013 1152    EKG: 05/22/2014: Sinus bradycardia rate of 48 beats a minute, first degree AV block. Left axis deviation, left anterior fascicular block. Right bundle branch block. Trifascicular block. Anterior infarct, probably subacute, cannot exclude anterolateral ischemia. Cannot exclude inferolateral ischemia. Compared to prior EKG dated 05/09/2014, ST segment depression and T-wave inversion in the anterolateral leads has improved.  ASSESSMENT AND PLAN:  1. Syncope, cardiac etiology. Ventricular arrhythmias in a patient with severe LV systolic dysfunction is to be excluded. High degree AV block due to A-V conduction disease and sinus node dysfunction is also a  possibility in a patient with markedly abnormal EKG revealing trifascicular block. 2. History of acute anterior ST elevation myocardial infarction on 05/07/2014. Stenting of the proximal and midsegment of the LAD with implantation of a 4.0 x 28 mm Xience Alpine DES.  3. Moderate to moderately severe aortic regurgitation 4. Hypertension 5. Hyperglycemia 6. UTI  Recommendation: Discontinue beta blockers due to symptomatic bradycardia, watch him on the telemetry for any evidence of heart block. Reevaluate his left ventricle systolic function by repeat echocardiogram. Patient will need Life-vest upon discharge. He'll also need 30 days of event monitoring to exclude bradycardic arrest due to conduction system abnormality.  I will discontinue carvedilol, I have started him on hydralazine 25 mg by mouth 3 times a day. Will continue to follow the patient along with you. Patient's family members including his daughter and grandchildren present the bedside.   Pamella PertGANJI,JAGADEESH R, MD 05/22/2014, 5:54 PM [facility] [place] Pager: [phone] Office: [phone] If no answer Cell [phone]

## 2014-05-22 NOTE — H&P (Signed)
[facility] History and Physical  John Mason ZOX:[id number] DOB: 04/30/1929 DOA: 05/22/2014  Referring physician: Doug Sou, MD PCP: Kimber Relic, MD   Chief Complaint: Syncope  HPI: John Mason is a 80 y.o. male with past medical history of hypertension, recent acute STEMI treated with PTCA on 05/07/2014, done by Dr. Jacinto Halim. He came in to the hospital today because of syncope. As mentioned above on 05/07/2014 patient had STEMI and had cardiac catheterization and up with stent placement, patient went home and he was doing okay until last weekend when he developed some weakness, later diagnosed with UTI and started on Levaquin but she continues to have weakness. Earlier today he was sitting on chair when he became unresponsive to his daughter, who brought in to the hospital for further evaluation. He denies any chest pain, denies palpitations or shortness of breath. He does not remember the whole incident. In the ED he is back to his baseline, but telemetry shows bradycardia with his heart rates ranging in the mid 40s, he had 12-lead EKG which showed first degree AV block, RBBB and LAFB questioning trifascicular block. Please note that patient is on beta blockers. Patient admitted to the hospital for further evaluation.   Review of Systems:  Constitutional: negative for anorexia, fevers and sweats Eyes: negative for irritation, redness and visual disturbance Ears, nose, mouth, throat, and face: negative for earaches, epistaxis, nasal congestion and sore throat Respiratory: negative for cough, dyspnea on exertion, sputum and wheezing Cardiovascular: negative for chest pain, dyspnea, lower extremity edema, orthopnea, palpitations and syncope Gastrointestinal: negative for abdominal pain, constipation, diarrhea, melena, nausea and vomiting Genitourinary:negative for dysuria, frequency and hematuria Hematologic/lymphatic: negative for bleeding, easy bruising and  lymphadenopathy Musculoskeletal:negative for arthralgias, muscle weakness and stiff joints Neurological: Syncope Endocrine: negative for diabetic symptoms including polydipsia, polyuria and weight loss Allergic/Immunologic: negative for anaphylaxis, hay fever and urticaria  Past Medical History  Diagnosis Date  . Urge incontinence   . Urinary frequency   . Unspecified vitamin D deficiency   . Contact dermatitis and other eczema due to other specified agent   . Pain in joint, lower leg   . Cognitive deficits, late effect of cerebrovascular disease   . Essential and other specified forms of tremor   . Obesity, unspecified   . First degree atrioventricular block   . Abnormality of gait   . Other abnormal blood chemistry   . Unspecified glaucoma   . Hypertrophy of prostate without urinary obstruction and other lower urinary tract symptoms (LUTS)   . Impotence of organic origin   . Actinic keratosis   . Insomnia, unspecified   . Elevated prostate specific antigen (PSA)   . Dizziness and giddiness   . Gastric ulcer, unspecified as acute or chronic, without mention of hemorrhage, perforation, or obstruction   . Unspecified essential hypertension   . MI (myocardial infarction) 05/07/2014   Past Surgical History  Procedure Laterality Date  . Appendectomy    . Left knee cyst removed  1977  . Transurethral resection of prostate  1987   Social History:   reports that he has been smoking Pipe.  His smokeless tobacco use includes Snuff. He reports that he does not drink alcohol or use illicit drugs.  No Known Allergies  Family History  Problem Relation Age of Onset  . Stroke Mother   . Stroke Sister      Prior to Admission medications   Medication Sig Start Date End Date Taking? Authorizing Hensley Aziz  aspirin 81 MG chewable tablet Chew 81 mg by mouth daily. 05/11/14  Yes Pamella PertJagadeesh R Ganji, MD  carvedilol (COREG) 12.5 MG tablet Take 12.5 mg by mouth 2 (two) times daily with a meal.  05/11/14  Yes Pamella PertJagadeesh R Ganji, MD  finasteride (PROSCAR) 5 MG tablet Take 5 mg by mouth daily. For Prostate   Yes Historical Remona Boom, MD  isosorbide mononitrate (IMDUR) 60 MG 24 hr tablet Take 1 tablet (60 mg total) by mouth daily. 05/11/14  Yes Pamella PertJagadeesh R Ganji, MD  latanoprost (XALATAN) 0.005 % ophthalmic solution Place 1 drop into both eyes at bedtime.  03/12/13  Yes Historical Shawan Tosh, MD  levofloxacin (LEVAQUIN) 500 MG tablet Take 500 mg by mouth 2 (two) times daily. Qty # 5 filled 05/21/14   Yes Historical Shanice Poznanski, MD  losartan-hydrochlorothiazide (HYZAAR) 100-25 MG per tablet Take 1 tablet by mouth daily. For blood pressure   Yes Historical Donnamaria Shands, MD  prasugrel (EFFIENT) 10 MG TABS tablet Take 10 mg by mouth daily. 05/11/14  Yes Pamella PertJagadeesh R Ganji, MD  solifenacin (VESICARE) 10 MG tablet Take 10 mg by mouth daily. For bladder control   Yes Historical Shenae Bonanno, MD  timolol (TIMOPTIC) 0.5 % ophthalmic solution Place 1 drop into both eyes daily.  03/12/13  Yes Historical Marisa Hufstetler, MD  nitroGLYCERIN (NITROSTAT) 0.4 MG SL tablet Place 1 tablet (0.4 mg total) under the tongue every 5 (five) minutes as needed for chest pain. 05/11/14   Pamella PertJagadeesh R Ganji, MD   Physical Exam: Filed Vitals:   05/22/14 1530  BP: 113/42  Pulse: 52  Temp:   Resp: 17   Constitutional: Oriented to person, place, and time. Well-developed and well-nourished. Cooperative.  Head: Normocephalic and atraumatic.  Nose: Nose normal.  Mouth/Throat: Uvula is midline, oropharynx is clear and moist and mucous membranes are normal.  Eyes: Conjunctivae and EOM are normal. Pupils are equal, round, and reactive to light.  Neck: Trachea normal and normal range of motion. Neck supple.  Cardiovascular: 2/6 Systolic murmur Pulmonary/Chest: Effort normal and breath sounds normal.  Abdominal: Soft. Bowel sounds are normal. There is no hepatosplenomegaly. There is no tenderness.  Musculoskeletal: Normal range of motion.   Neurological: Alert and oriented to person, place, and time. He has normal strength. No cranial nerve deficit or sensory deficit.  Skin: Skin is warm, dry and intact.  Psychiatric: Has a normal mood and affect. Speech is normal and behavior is normal.   Labs on Admission:  Basic Metabolic Panel:  Recent Labs Lab 05/22/14 1330  NA 138  K 4.9  CL 101  CO2 24  GLUCOSE 136*  BUN 23  CREATININE 1.45*  CALCIUM 8.9   Liver Function Tests: No results found for this basename: AST, ALT, ALKPHOS, BILITOT, PROT, ALBUMIN,  in the last 168 hours No results found for this basename: LIPASE, AMYLASE,  in the last 168 hours No results found for this basename: AMMONIA,  in the last 168 hours CBC:  Recent Labs Lab 05/22/14 1330  WBC 7.0  HGB 10.5*  HCT 32.0*  MCV 89.4  PLT 134*   Cardiac Enzymes: No results found for this basename: CKTOTAL, CKMB, CKMBINDEX, TROPONINI,  in the last 168 hours  BNP (last 3 results) No results found for this basename: PROBNP,  in the last 8760 hours CBG:  Recent Labs Lab 05/22/14 1336  GLUCAP 142*    Radiological Exams on Admission: No results found.  EKG: Independently reviewed.   Assessment/Plan Principal Problem:   Syncope Active Problems:  Hypertension   CAD (coronary artery disease)   Bradycardia   UTI (lower urinary tract infection)   Syncope -Patient was unresponsive for a period of time, no falls. -Unclear etiology, had recent STEMI so could be secondary to arrhythmias. -Patient EKG had first degree AV block, RBBB and LAFB, has marked bradycardia with heart rate in the 40s. -Cardiology to evaluate for presence of tachyarrhythmias versus trifascicular block. -Patient also has UTI and this is might be contributing to the syncope picture.  UTI -He was diagnosed as outpatient, on Levaquin, I started him on Rocephin. -I will obtain urine culture and adjust antibiotics according to culture results if it is  positive.  Hypertension -Patient on multiple cardioactive medications, cardiology to evaluate. -He reported he was dizzy prior to discharge from the hospital on last admission.  -He was on hydralazine, that was discontinued prior to discharge because of the dizziness.  Acute renal failure -Likely secondary to dehydration and medication effects. -Hold losartan and hydrochlorothiazide, started very cautious IV hydration. -Patient has low ejection fraction, probably IV fluids need to be discontinued in the morning.  Ischemic cardiomyopathy -Patient has left ejection fraction of 20-25% which increases chances to have ventricular tachyarrhythmias. -No evidence of decompensation no lower extremity swelling or orthopnea. -Patient to be evaluated by cardiology.   Code Status: Full Code Family Communication: I discussed with the patient in the presence of his daughter at bedside. Disposition Plan: Telemetry, inpatient  Time spent: 70 minutes  [facility] OrtizELMAHI,MUTAZ A [facility] Pager [phone]

## 2014-05-22 NOTE — ED Provider Notes (Addendum)
CSN: [id number]     Arrival date & time 05/22/14  1300 History   First MD Initiated Contact with Patient 05/22/14 1327     Chief Complaint  Patient presents with  . Loss of Consciousness     (Consider location/radiation/quality/duration/timing/severity/associated sxs/prior Treatment) HPI Patient suffered a near syncopal event this afternoon meal prior to arrival. Presently asymptomatic. His daughter reports that he was unconscious for a few minutes. He denies headache denies chest pain . No black stools or blood per rectump no other complaint. No shortness of breath. No treatment prior to coming here. Presently asymptomatic  Past Medical History  Diagnosis Date  . Urge incontinence   . Urinary frequency   . Unspecified vitamin D deficiency   . Contact dermatitis and other eczema due to other specified agent   . Pain in joint, lower leg   . Cognitive deficits, late effect of cerebrovascular disease   . Essential and other specified forms of tremor   . Obesity, unspecified   . First degree atrioventricular block   . Abnormality of gait   . Other abnormal blood chemistry   . Unspecified glaucoma   . Hypertrophy of prostate without urinary obstruction and other lower urinary tract symptoms (LUTS)   . Impotence of organic origin   . Actinic keratosis   . Insomnia, unspecified   . Elevated prostate specific antigen (PSA)   . Dizziness and giddiness   . Gastric ulcer, unspecified as acute or chronic, without mention of hemorrhage, perforation, or obstruction   . Unspecified essential hypertension    Past Surgical History  Procedure Laterality Date  . Appendectomy    . Left knee cyst removed  1977  . Transurethral resection of prostate  1987   Family History  Problem Relation Age of Onset  . Stroke Mother   . Stroke Sister    History  Substance Use Topics  . Smoking status: Light Tobacco Smoker    Types: Pipe  . Smokeless tobacco: Current User    Types: Snuff  . Alcohol  Use: No    Review of Systems  Constitutional: Negative.   HENT: Negative.   Respiratory: Negative.   Cardiovascular:       Syncope  Gastrointestinal: Negative.   Musculoskeletal: Negative.   Skin: Negative.   Neurological: Negative.   Psychiatric/Behavioral: Negative.   All other systems reviewed and are negative.     Allergies  Review of patient's allergies indicates no known allergies.  Home Medications   Prior to Admission medications   Medication Sig Start Date End Date Taking? Authorizing Provider  aspirin 81 MG chewable tablet Chew 81 mg by mouth daily. 05/11/14  Yes Pamella PertJagadeesh R Ganji, MD  carvedilol (COREG) 12.5 MG tablet Take 12.5 mg by mouth 2 (two) times daily with a meal. 05/11/14  Yes Pamella PertJagadeesh R Ganji, MD  finasteride (PROSCAR) 5 MG tablet Take 5 mg by mouth daily. For Prostate   Yes Historical Provider, MD  isosorbide mononitrate (IMDUR) 60 MG 24 hr tablet Take 1 tablet (60 mg total) by mouth daily. 05/11/14  Yes Pamella PertJagadeesh R Ganji, MD  latanoprost (XALATAN) 0.005 % ophthalmic solution Place 1 drop into both eyes at bedtime.  03/12/13  Yes Historical Provider, MD  levofloxacin (LEVAQUIN) 500 MG tablet Take 500 mg by mouth 2 (two) times daily. Qty # 5 filled 05/21/14   Yes Historical Provider, MD  losartan-hydrochlorothiazide (HYZAAR) 100-25 MG per tablet Take 1 tablet by mouth daily. For blood pressure   Yes Historical Provider,  MD  prasugrel (EFFIENT) 10 MG TABS tablet Take 10 mg by mouth daily. 05/11/14  Yes Pamella PertJagadeesh R Ganji, MD  solifenacin (VESICARE) 10 MG tablet Take 10 mg by mouth daily. For bladder control   Yes Historical Provider, MD  timolol (TIMOPTIC) 0.5 % ophthalmic solution Place 1 drop into both eyes daily.  03/12/13  Yes Historical Provider, MD  nitroGLYCERIN (NITROSTAT) 0.4 MG SL tablet Place 1 tablet (0.4 mg total) under the tongue every 5 (five) minutes as needed for chest pain. 05/11/14   Pamella PertJagadeesh R Ganji, MD   BP 112/39  Pulse 46  Temp(Src) 98.4 F  (36.9 C) (Oral)  Resp 12  SpO2 96% Physical Exam  Nursing note and vitals reviewed. Constitutional: He appears well-developed and well-nourished.  HENT:  Head: Normocephalic and atraumatic.  Eyes: Conjunctivae are normal. Pupils are equal, round, and reactive to light.  Neck: Neck supple. No tracheal deviation present. No thyromegaly present.  Cardiovascular: Regular rhythm.   No murmur heard. Bradycardia  Pulmonary/Chest: Effort normal and breath sounds normal.  Abdominal: Soft. Bowel sounds are normal. He exhibits no distension. There is no tenderness.  Musculoskeletal: Normal range of motion. He exhibits no edema and no tenderness.  Neurological: He is alert. Coordination normal.  Skin: Skin is warm and dry. No rash noted.  Psychiatric: He has a normal mood and affect.    ED Course  Procedures (including critical care time) Labs Review Labs Reviewed  CBC - Abnormal; Notable for the following:    RBC 3.58 (*)    Hemoglobin 10.5 (*)    HCT 32.0 (*)    Platelets 134 (*)    All other components within normal limits  BASIC METABOLIC PANEL - Abnormal; Notable for the following:    Glucose, Bld 136 (*)    Creatinine, Ser 1.45 (*)    GFR calc non Af Amer 43 (*)    GFR calc Af Amer 50 (*)    All other components within normal limits  CBG MONITORING, ED - Abnormal; Notable for the following:    Glucose-Capillary 142 (*)    All other components within normal limits  I-STAT TROPOININ, ED    Imaging Review No results found.   EKG Interpretation   Date/Time:  Tuesday May 22 2014 13:10:40 EDT Ventricular Rate:  48 PR Interval:  259 QRS Duration: 136 QT Interval:  548 QTC Calculation: 490 R Axis:   -59 Text Interpretation:  Sinus bradycardia Prolonged PR interval RBBB and  LAFB Abnormal T, probable ischemia, widespread Baseline wander in lead(s)  V1 No significant change since last tracing Confirmed by JACUBOWITZ  MD,  SAM [phone]([id number]) on 05/22/2014 1:17:43 PM     3:05 PM  patient resting comfortably. Asymptomatic Results for orders placed during the hospital encounter of 05/22/14  CBC      Result Value Ref Range   WBC 7.0  4.0 - 10.5 K/uL   RBC 3.58 (*) 4.22 - 5.81 MIL/uL   Hemoglobin 10.5 (*) 13.0 - 17.0 g/dL   HCT 19.132.0 (*) 47.839.0 - 29.552.0 %   MCV 89.4  78.0 - 100.0 fL   MCH 29.3  26.0 - 34.0 pg   MCHC 32.8  30.0 - 36.0 g/dL   RDW 62.114.2  30.811.5 - 65.715.5 %   Platelets 134 (*) 150 - 400 K/uL  BASIC METABOLIC PANEL      Result Value Ref Range   Sodium 138  137 - 147 mEq/L   Potassium 4.9  3.7 - 5.3 mEq/L  Chloride 101  96 - 112 mEq/L   CO2 24  19 - 32 mEq/L   Glucose, Bld 136 (*) 70 - 99 mg/dL   BUN 23  6 - 23 mg/dL   Creatinine, Ser 4.54 (*) 0.50 - 1.35 mg/dL   Calcium 8.9  8.4 - 09.8 mg/dL   GFR calc non Af Amer 43 (*) >90 mL/min   GFR calc Af Amer 50 (*) >90 mL/min  CBG MONITORING, ED      Result Value Ref Range   Glucose-Capillary 142 (*) 70 - 99 mg/dL  I-STAT TROPOININ, ED      Result Value Ref Range   Troponin i, poc 0.06  0.00 - 0.08 ng/mL   Comment 3            Dg Chest Port 1v Same Day  05/07/2014   CLINICAL DATA:  mediastinal enlargement ; acute myocardial infarction  EXAM: PORTABLE CHEST - 1 VIEW SAME DAY  COMPARISON:  None.  FINDINGS: There is mild left base atelectasis. Lungs elsewhere are clear. Heart is mildly enlarged with pulmonary vascularity within normal limits. The mediastinum does not appear enlarged on this semi rec portable examination. There is no appreciable adenopathy. No bone lesions.  IMPRESSION: The heart appears prominent. Mild left base atelectasis. Mediastinum does not appear enlarged on this portable examination. If there remains concern for potential mediastinal pathology, chest CT with intravenous contrast would be advised to further evaluate.   Electronically Signed   By: Bretta Bang M.D.   On: 05/07/2014 12:10    MDM  Spoke with Dr.Ganji who defers admission to hospitalist. He feels that patient requires  admission and will need a life vest Spoke with Dr.Elmahi plan admit telemetry floor Diagnosis#1 syncope #2 anemia #3hyperglycemia Final diagnoses:  None        Doug Sou, MD 05/22/14 1512  Doug Sou, MD 05/22/14 1800

## 2014-05-22 NOTE — ED Notes (Signed)
[facility] for syncopal event. Syncope lasting 2min, Daughter with PT at time. Nausea prior. Multiple emesis after event. EMS: zofran 4mg , ASA 324mg .  PT does NOT endorse CP SOB

## 2014-05-23 LAB — CBC
HCT: 33.4 % — ABNORMAL LOW (ref 39.0–52.0)
HEMOGLOBIN: 11 g/dL — AB (ref 13.0–17.0)
MCH: 29.2 pg (ref 26.0–34.0)
MCHC: 32.9 g/dL (ref 30.0–36.0)
MCV: 88.6 fL (ref 78.0–100.0)
Platelets: 138 10*3/uL — ABNORMAL LOW (ref 150–400)
RBC: 3.77 MIL/uL — ABNORMAL LOW (ref 4.22–5.81)
RDW: 14.4 % (ref 11.5–15.5)
WBC: 6.5 10*3/uL (ref 4.0–10.5)

## 2014-05-23 LAB — BASIC METABOLIC PANEL
BUN: 24 mg/dL — AB (ref 6–23)
CALCIUM: 9 mg/dL (ref 8.4–10.5)
CO2: 21 mEq/L (ref 19–32)
Chloride: 96 mEq/L (ref 96–112)
Creatinine, Ser: 1.34 mg/dL (ref 0.50–1.35)
GFR calc Af Amer: 55 mL/min — ABNORMAL LOW (ref >90)
GFR calc non Af Amer: 47 mL/min — ABNORMAL LOW (ref >90)
GLUCOSE: 95 mg/dL (ref 70–99)
POTASSIUM: 4.4 meq/L (ref 3.7–5.3)
Sodium: 133 mEq/L — ABNORMAL LOW (ref 137–147)

## 2014-05-23 MED ORDER — BENZONATATE 100 MG PO CAPS
100.0000 mg | ORAL_CAPSULE | Freq: Three times a day (TID) | ORAL | Status: DC
  Administered 2014-05-23 – 2014-05-24 (×3): 100 mg via ORAL
  Filled 2014-05-23 (×5): qty 1

## 2014-05-23 MED ORDER — FINASTERIDE 5 MG PO TABS
5.0000 mg | ORAL_TABLET | Freq: Every day | ORAL | Status: DC
  Administered 2014-05-23 – 2014-05-24 (×2): 5 mg via ORAL
  Filled 2014-05-23 (×2): qty 1

## 2014-05-23 NOTE — Progress Notes (Signed)
Pt encouraged to increase activity slowly, pt sitting on side of bed with daughter at bedside, will continue to monitor John BalboaStein, John Chicas G, RN

## 2014-05-23 NOTE — Progress Notes (Addendum)
Chart reviewed.   [facility] PROGRESS NOTE  Clovis Pulwood R Bromell NUU:[id number] DOB: [birth year] DOA: 05/22/2014 PCP: Kimber RelicGREEN, ARTHUR G, MD  Assessment/Plan:  Principal Problem:   Syncope Active Problems:   Hypertension   CAD (coronary artery disease)   Bradycardia   UTI (lower urinary tract infection)   ARF (acute renal failure) Cough: tessalon  Currently improving. Urine culture pending. Increase activity. Physical therapy consult. Lifevest and home tomorrow.  Code Status:  full Family Communication:   Disposition Plan:  home  Consultants:  Cardiology  Procedures:     Antibiotics:  Ceftriaxone 6/30  HPI/Subjective: No cp, dyspnea. C/o cough since last amission. Nonproductive.  Objective: Filed Vitals:   05/23/14 1308  BP: 124/39  Pulse:   Temp:   Resp:     Intake/Output Summary (Last 24 hours) at 05/23/14 1324 Last data filed at 05/23/14 1208  Gross per 24 hour  Intake    720 ml  Output   1150 ml  Net   -430 ml   Filed Weights   05/22/14 1639  Weight: 75 kg (165 lb 5.5 oz)    Exam:   General:  Alert, oriented. Nontoxic. Talkative.  Cardiovascular: Regular rate rhythm without murmurs gallops rubs  Respiratory: Clear to auscultation bilaterally without wheezes rhonchi or rales  Abdomen: Soft nontender nondistended  Ext: No clubbing cyanosis or edema  Basic Metabolic Panel:  Recent Labs Lab 05/22/14 1330 05/23/14 0440  NA 138 133*  K 4.9 4.4  CL 101 96  CO2 24 21  GLUCOSE 136* 95  BUN 23 24*  CREATININE 1.45* 1.34  CALCIUM 8.9 9.0   Liver Function Tests: No results found for this basename: AST, ALT, ALKPHOS, BILITOT, PROT, ALBUMIN,  in the last 168 hours No results found for this basename: LIPASE, AMYLASE,  in the last 168 hours No results found for this basename: AMMONIA,  in the last 168 hours CBC:  Recent Labs Lab 05/22/14 1330 05/23/14 0440  WBC 7.0 6.5  HGB 10.5* 11.0*  HCT 32.0* 33.4*  MCV 89.4 88.6  PLT 134*  138*   Cardiac Enzymes: No results found for this basename: CKTOTAL, CKMB, CKMBINDEX, TROPONINI,  in the last 168 hours BNP (last 3 results) No results found for this basename: PROBNP,  in the last 8760 hours CBG:  Recent Labs Lab 05/22/14 1336  GLUCAP 142*    No results found for this or any previous visit (from the past 240 hour(s)).   Studies: No results found.  Scheduled Meds: . aspirin  81 mg Oral Daily  . cefTRIAXone (ROCEPHIN)  IV  1 g Intravenous Q24H  . heparin  5,000 Units Subcutaneous 3 times per day  . hydrALAZINE  25 mg Oral 3 times per day  . isosorbide mononitrate  60 mg Oral Daily  . latanoprost  1 drop Both Eyes QHS  . prasugrel  10 mg Oral Daily  . sodium chloride  3 mL Intravenous Q12H  . timolol  1 drop Both Eyes Daily   Continuous Infusions: . sodium chloride 75 mL/hr at 05/22/14 1709    Time spent: 35 minutes  Annette Liotta L  [facility] Pager [phone]. If 7PM-7AM, please contact night-coverage at [email or web address], password [email or web address] 05/23/2014, 1:24 PM  LOS: 1 day

## 2014-05-23 NOTE — Evaluation (Addendum)
Physical Therapy Evaluation Patient Details Name: John Mason MRN: [id number] DOB: Sep 16, 1929 Today's Date: 05/23/2014   History of Present Illness  Admitted after syncopal episode with period of unresponsiveness.  Scheduled for Life vest prior to d/c home.  Clinical Impression  Pt admitted with/for above problem post recent STEMI.  Pt currently limited functionally due to the problems listed below.  (see problems list.)  Pt will benefit from PT to maximize function and safety to be able to get home safely with available assist of family.     Follow Up Recommendations Other (comment);Supervision for mobility/OOB (ST SNF may be approp., dtr back to work very soon)    Equipment Recommendations  None recommended by PT    Recommendations for Other Services       Precautions / Restrictions Precautions Precautions: Fall      Mobility  Bed Mobility Overal bed mobility: Modified Independent                Transfers Overall transfer level: Needs assistance   Transfers: Sit to/from Stand Sit to Stand: Min guard         General transfer comment: guard for safety only  Ambulation/Gait Ambulation/Gait assistance: Min guard Ambulation Distance (Feet): 250 Feet Assistive device: None (vs iv pole) Gait Pattern/deviations: Step-through pattern;Scissoring;Drifts right/left Gait velocity: slower   General Gait Details: mild unsteady throughout using the iv pole or nothing, but generally improved in steadiness as he progressed distance until  noticeable fatigue and then more unsteadiness.  Stairs            Wheelchair Mobility    Modified Rankin (Stroke Patients Only)       Balance Overall balance assessment: Needs assistance Sitting-balance support: Feet supported;No upper extremity supported Sitting balance-Leahy Scale: Good     Standing balance support: No upper extremity supported Standing balance-Leahy Scale: Fair Standing balance comment: can stand  statically unassisted and pick up objects off the floor.                             Pertinent Vitals/Pain EHR in the mid to upper 40's     Home Living Family/patient expects to be discharged to:: Other (Comment) (daughter's home) Living Arrangements: Children Available Help at Discharge:  (daughter home on vaca. and leave for 1.5 weeks then PRN) Type of Home: House Home Access: Stairs to enter Entrance Stairs-Rails: Right;Left   Home Layout: One level        Prior Function Level of Independence: Independent               Hand Dominance        Extremity/Trunk Assessment               Lower Extremity Assessment: Generalized weakness         Communication   Communication: No difficulties  Cognition Arousal/Alertness: Awake/alert Behavior During Therapy: WFL for tasks assessed/performed Overall Cognitive Status: Within Functional Limits for tasks assessed                      General Comments      Exercises        Assessment/Plan    PT Assessment Patient needs continued PT services  PT Diagnosis Generalized weakness;Other (comment) (decr activity tolerance)   PT Problem List Decreased strength;Decreased activity tolerance;Decreased balance;Cardiopulmonary status limiting activity  PT Treatment Interventions Gait training;Functional mobility training;Therapeutic activities;Patient/family education;Balance training   PT Goals (Current  goals can be found in the Care Plan section) Acute Rehab PT Goals Patient Stated Goal: Agreed needed to build up his endurance PT Goal Formulation: With patient Time For Goal Achievement: 05/30/14 Potential to Achieve Goals: Good    Frequency Min 3X/week   Barriers to discharge   Pt has assist for 1.5 weeks until daughter has to go back to work.    Co-evaluation               End of Session   Activity Tolerance: Patient tolerated treatment well Patient left: Other (comment);with  family/visitor present;with call bell/phone within reach (sitting EOB) Nurse Communication: Mobility status         Time: 1730-1755 PT Time Calculation (min): 25 min   Charges:   PT Evaluation $Initial PT Evaluation Tier I: 1 Procedure PT Treatments $Gait Training: 8-22 mins   PT G Codes:          Tacara Hadlock, Eliseo GumKenneth V 05/23/2014, 6:41 PM  05/23/2014  Ruth BingKen Rodriguez Aguinaldo, PT [phone]  (pager)

## 2014-05-23 NOTE — Progress Notes (Signed)
Nutrition Brief Note  Patient identified on the Malnutrition Screening Tool (MST) Report.  Wt Readings from Last 15 Encounters:  05/22/14 165 lb 5.5 oz (75 kg)  05/11/14 171 lb 8 oz (77.792 kg)  05/11/14 171 lb 8 oz (77.792 kg)  10/31/13 179 lb (81.194 kg)  03/29/13 175 lb 3.2 oz (79.47 kg)    Body mass index is 24.41 kg/(m^2). Patient meets criteria for Normal based on current BMI.   Current diet order is Heart Healthy, patient is consuming approximately 70-100% of meals at this time. Labs and medications reviewed.   No nutrition interventions warranted at this time. If nutrition issues arise, please consult RD.   Maureen ChattersKatie Xylah Early, RD, LDN Pager #: [phone] After-Hours Pager #: [phone]

## 2014-05-23 NOTE — Progress Notes (Signed)
Subjective:  Feels well  Objective:  Vital Signs in the last 24 hours: Temp:  [97.4 F (36.3 C)-98.4 F (36.9 C)] 97.9 F (36.6 C) (07/01 0549) Pulse Rate:  [43-68] 64 (07/01 0808) Resp:  [10-18] 18 (07/01 0549) BP: (108-160)/(34-58) 145/37 mmHg (07/01 0808) SpO2:  [96 %-99 %] 99 % (07/01 0549) Weight:  [75 kg (165 lb 5.5 oz)] 75 kg (165 lb 5.5 oz) (06/30 1639)  Intake/Output from previous day: 06/30 0701 - 07/01 0700 In: 240 [P.O.:240] Out: 200 [Urine:200]  Physical Exam: General appearance: alert, cooperative, appears stated age and no distress  Lungs: clear to auscultation bilaterally  Chest wall: no tenderness  Heart: S1, S2 normal. Long Early diastolic murmur present II/IV in the left sternal border  Abdomen: soft, non-tender; bowel sounds normal; no masses, no organomegaly  Extremities: extremities normal, atraumatic, no cyanosis or edema Neurologic: Grossly normal Lab Results: BMP  Recent Labs  05/08/14 0317 05/22/14 1330 05/23/14 0440  NA 137 138 133*  K 4.0 4.9 4.4  CL 100 101 96  CO2 21 24 21   GLUCOSE 128* 136* 95  BUN 22 23 24*  CREATININE 0.98 1.45* 1.34  CALCIUM 8.9 8.9 9.0  GFRNONAA 74* 43* 47*  GFRAA 86* 50* 55*    CBC  Recent Labs Lab 05/23/14 0440  WBC 6.5  RBC 3.77*  HGB 11.0*  HCT 33.4*  PLT 138*  MCV 88.6  MCH 29.2  MCHC 32.9  RDW 14.4    HEMOGLOBIN A1C Lab Results  Component Value Date   HGBA1C 6.3* 05/04/2014    Cardiac Panel (last 3 results)  Recent Labs  05/07/14 1235 05/07/14 1940 05/08/14 0317  05/10/14 1448 05/10/14 2230 05/11/14 0612  CKTOTAL 1479* 1129* 868*  --   --   --   --   CKMB 190.7* 121.5* 71.4*  --   --   --   --   TROPONINI >20.00* >20.00* >20.00*  < > 5.58* 5.00* 4.60*  RELINDX 12.9* 10.8* 8.2*  --   --   --   --   < > = values in this interval not displayed.  BNP (last 3 results) No results found for this basename: PROBNP,  in the last 8760 hours  TSH  Recent Labs  05/07/14 1235  05/22/14 1835  TSH 1.670 0.930    CHOLESTEROL  Recent Labs  05/07/14 0530 05/07/14 1235  CHOL 116 134    Hepatic Function Panel  05/23/2014: HR 58 but otherwise no change from yesterday EKG: 05/22/2014, Sinus bradycardia rate of 48 beats a minute, first degree AV block. Left axis deviation, left anterior fascicular block. Right bundle branch block. Trifascicular block. Anterior infarct, probably subacute, cannot exclude anterolateral ischemia. Cannot exclude inferolateral ischemia. Compared to prior EKG dated 05/09/2014, ST segment depression and T-wave inversion in the anterolateral leads has improved.  EKG: Tele: SR with 1st degree, HR 58-65/min  Assessment/Plan:  1. Syncope, cardiac etiology.  Ventricular arrhythmias in a patient with severe LV systolic dysfunction is to be excluded. High degree AV block due to A-V conduction disease and sinus node dysfunction is also a possibility in a patient with markedly abnormal EKG revealing trifascicular block.  2. History of acute anterior ST elevation myocardial infarction on 05/07/2014. Stenting of the proximal and midsegment of the LAD with implantation of a 4.0 x 28 mm Xience Alpine DES.  3. Moderate to moderately severe aortic regurgitation  4. Hypertension  5. Hyperglycemia  6. UTI  Rec: Ordered Lifevest for syncope  and ischemic cardiomyopathy. Cancel Echo. Event monitor 30 days. Patient to come to our office for hook up upon discharge and I will see him in 2 weeks. Discharge tomorrow.   Pamella PertGANJI,JAGADEESH R, M.D. 05/23/2014, 9:04 AM [facility] Pager: [phone] Office: [phone] If no answer: [phone]

## 2014-05-23 NOTE — Care Management Note (Signed)
    Page 1 of 2   05/24/2014     4:47:38 PM CARE MANAGEMENT NOTE 05/24/2014  Patient:  John Mason,John Mason   Account Number:  [id number]  Date Initiated:  05/23/2014  Documentation initiated by:  Juniel Groene  Subjective/Objective Assessment:   Pt adm with syncopal episode, ventricular arrythmias.  PTA, pt independent; has supportive daughter.     Action/Plan:   MD has ordered Lifevest.  Notified Leafy HalfGlenn Lowe, rep for [employer] Lifevest to facilitate obtaining Lifevest for pt.   Anticipated DC Date:  05/24/2014   Anticipated DC Plan:  HOME/SELF CARE      DC Planning Services  CM consult      [facility]   Choice offered to / List presented to:  C-4 Adult Children   DME arranged  OTHER - SEE COMMENT      DME agency  OTHER - SEE NOTE     HH arranged  HH-1 RN  HH-2 PT  HH-3 OT      HH agency  [facility]   Status of service:  Completed, signed off Medicare Important Message given?  NA - LOS <3 / Initial given by admissions (If response is "NO", the following Medicare IM given date fields will be blank) Date Medicare IM given:   Medicare IM given by:   Date Additional Medicare IM given:   Additional Medicare IM given by:    Discharge Disposition:  HOME W HOME HEALTH SERVICES  Per UR Regulation:  Reviewed for med. necessity/level of care/duration of stay  If discussed at Long Length of Stay Meetings, dates discussed:    Comments:  05/24/14 Sidney AceJulie Loveah Like, RN, BSN [phone] Pt for dc today after Lifevest fitting.  Spoke with pt's daughter Claris GladdenGale, to arrange homecare:  [facility].  Start of care 24-48h post dc date.  Pt to stay with dtr for 2-3 weeks post dc, as she plans to take a leave from work and provide 24hr care.  Daughter's home address: [address] [place], [place] [address] home:  [phone] cell:   [phone]

## 2014-05-24 LAB — URINE CULTURE
COLONY COUNT: NO GROWTH
Culture: NO GROWTH

## 2014-05-24 MED ORDER — LOSARTAN POTASSIUM 25 MG PO TABS
12.5000 mg | ORAL_TABLET | Freq: Every day | ORAL | Status: DC
  Administered 2014-05-24: 12.5 mg via ORAL
  Filled 2014-05-24 (×2): qty 0.5

## 2014-05-24 MED ORDER — CEFUROXIME AXETIL 500 MG PO TABS: 500.0000 mg | ORAL_TABLET | Freq: Two times a day (BID) | ORAL | Status: DC

## 2014-05-24 MED ORDER — HYDRALAZINE HCL 25 MG PO TABS: 25.0000 mg | ORAL_TABLET | Freq: Three times a day (TID) | ORAL | Status: DC

## 2014-05-24 MED ORDER — BENZONATATE 100 MG PO CAPS: 100.0000 mg | ORAL_CAPSULE | Freq: Three times a day (TID) | ORAL | Status: DC | PRN

## 2014-05-24 NOTE — Discharge Summary (Signed)
Physician Discharge Summary  John Mason:[id number] DOB: [birth year] DOA: 05/22/2014  PCP: Kimber Relic, MD  Admit date: 05/22/2014 Discharge date: 05/24/2014  Time spent: greater than 30 min  Recommendations for Outpatient Follow-up:  1. Life vest arranged 2. Home PT, RN arranged  Discharge Diagnoses:  Principal Problem:   Syncope Active Problems:   Hypertension   CAD (coronary artery disease)   Bradycardia   UTI (lower urinary tract infection)   ARF (acute renal failure)   Discharge Condition: *stable  Filed Weights   05/22/14 1639  Weight: 75 kg (165 lb 5.5 oz)    History of present illness:   80 y.o. male with past medical history of hypertension, recent acute STEMI treated with PTCA on 05/07/2014, done by Dr. Jacinto Halim. He came in to the hospital today because of syncope. As mentioned above on 05/07/2014 patient had STEMI and had cardiac catheterization and up with stent placement, patient went home and he was doing okay until last weekend when he developed some weakness, later diagnosed with UTI and started on Levaquin but she continues to have weakness. Earlier today he was sitting on chair when he became unresponsive to his daughter, who brought in to the hospital for further evaluation. He denies any chest pain, denies palpitations or shortness of breath. He does not remember the whole incident.  In the ED he is back to his baseline, but telemetry shows bradycardia with his heart rates ranging in the mid 40s, he had 12-lead EKG which showed first degree AV block, RBBB and LAFB questioning trifascicular block. Please note that patient is on beta blockers. Also evidence of UTI on UA.Patient admitted to the hospital for further evaluation.  Hospital Course:  Admitted to telemetry, cardiology consulted.  Beta blockers stopped.  Hydralazine resumed. Also on IMDUR.  Bradycardia resolved.  Life Vest for syncope and ischemic cardiomyopathy arranged.  Event monitor for 30 days  will be arranged by Dr. Nadara Eaton.  Stopped losartan/HCT due to renal failure (creatinine increase to 1.45).  Given rocephin for UTI.  Worked with PT who recommend home health PT  Procedures:  none  Consultations:  Cardiology, [place]  Discharge Exam: Filed Vitals:   05/24/14 0455  BP: 163/57  Pulse: 64  Temp: 98.6 F (37 C)  Resp: 18    General: comfortable, alert oriented Cardiovascular: RRR without MGR Respiratory: CTA without WRR   Discharge Instructions   Diet - low sodium heart healthy    Complete by:  As directed      Increase activity slowly    Complete by:  As directed             Medication List    STOP taking these medications       carvedilol 12.5 MG tablet  Commonly known as:  COREG     levofloxacin 500 MG tablet  Commonly known as:  LEVAQUIN     losartan-hydrochlorothiazide 100-25 MG per tablet  Commonly known as:  HYZAAR      TAKE these medications       aspirin 81 MG chewable tablet  Chew 81 mg by mouth daily.     benzonatate 100 MG capsule  Commonly known as:  TESSALON  Take 1 capsule (100 mg total) by mouth 3 (three) times daily as needed for cough.     cefUROXime 500 MG tablet  Commonly known as:  CEFTIN  Take 1 tablet (500 mg total) by mouth 2 (two) times daily with a meal.  finasteride 5 MG tablet  Commonly known as:  PROSCAR  Take 5 mg by mouth daily. For Prostate     hydrALAZINE 25 MG tablet  Commonly known as:  APRESOLINE  Take 1 tablet (25 mg total) by mouth every 8 (eight) hours.     isosorbide mononitrate 60 MG 24 hr tablet  Commonly known as:  IMDUR  Take 1 tablet (60 mg total) by mouth daily.     latanoprost 0.005 % ophthalmic solution  Commonly known as:  XALATAN  Place 1 drop into both eyes at bedtime.     nitroGLYCERIN 0.4 MG SL tablet  Commonly known as:  NITROSTAT  Place 1 tablet (0.4 mg total) under the tongue every 5 (five) minutes as needed for chest pain.     prasugrel 10 MG Tabs tablet  Commonly  known as:  EFFIENT  Take 10 mg by mouth daily.     solifenacin 10 MG tablet  Commonly known as:  VESICARE  Take 10 mg by mouth daily. For bladder control     timolol 0.5 % ophthalmic solution  Commonly known as:  TIMOPTIC  Place 1 drop into both eyes daily.       No Known Allergies     Follow-up Information   Follow up with Earl ManyVyas, Chandra K, MD On 06/08/2014. (2 weeks for f/u syncope and CAD 11 am)    Specialty:  Cardiology   Contact information:   [facility] [address] [place] [address] [phone]       Follow up with GREEN, Lenon CurtARTHUR G, MD. (As needed)    Specialty:  Internal Medicine   Contact information:   [address] [place] [address] [phone]        The results of significant diagnostics from this hospitalization (including imaging, microbiology, ancillary and laboratory) are listed below for reference.    Significant Diagnostic Studies: Dg Chest Port 1v Same Day  05/07/2014   CLINICAL DATA:  mediastinal enlargement ; acute myocardial infarction  EXAM: PORTABLE CHEST - 1 VIEW SAME DAY  COMPARISON:  None.  FINDINGS: There is mild left base atelectasis. Lungs elsewhere are clear. Heart is mildly enlarged with pulmonary vascularity within normal limits. The mediastinum does not appear enlarged on this semi rec portable examination. There is no appreciable adenopathy. No bone lesions.  IMPRESSION: The heart appears prominent. Mild left base atelectasis. Mediastinum does not appear enlarged on this portable examination. If there remains concern for potential mediastinal pathology, chest CT with intravenous contrast would be advised to further evaluate.   Electronically Signed   By: Bretta BangWilliam  Woodruff M.D.   On: 05/07/2014 12:10   EKG Sinus bradycardia Prolonged PR interval RBBB and LAFB Abnormal T, probable ischemia, widespread Baseline wander in lead(s) V  Microbiology: Recent Results (from the past 240 hour(s))   URINE CULTURE     Status: None   Collection Time    05/22/14  8:50 PM      Result Value Ref Range Status   Specimen Description URINE, RANDOM   Final   Special Requests NONE   Final   Culture  Setup Time     Final   Value: 05/22/2014 22:38     Performed at [facility]   Colony Count     Final   Value: NO GROWTH     [facility]formed at [facility]   Culture     Final   Value: NO GROWTH  Performed at [facility]   Report Status 05/24/2014 FINAL   Final     Labs: Basic Metabolic Panel:  Recent Labs Lab 05/22/14 1330 05/23/14 0440  NA 138 133*  K 4.9 4.4  CL 101 96  CO2 24 21  GLUCOSE 136* 95  BUN 23 24*  CREATININE 1.45* 1.34  CALCIUM 8.9 9.0   Liver Function Tests: No results found for this basename: AST, ALT, ALKPHOS, BILITOT, PROT, ALBUMIN,  in the last 168 hours No results found for this basename: LIPASE, AMYLASE,  in the last 168 hours No results found for this basename: AMMONIA,  in the last 168 hours CBC:  Recent Labs Lab 05/22/14 1330 05/23/14 0440  WBC 7.0 6.5  HGB 10.5* 11.0*  HCT 32.0* 33.4*  MCV 89.4 88.6  PLT 134* 138*   Cardiac Enzymes: No results found for this basename: CKTOTAL, CKMB, CKMBINDEX, TROPONINI,  in the last 168 hours BNP: BNP (last 3 results) No results found for this basename: PROBNP,  in the last 8760 hours CBG:  Recent Labs Lab 05/22/14 1336  GLUCAP 142*       Signed:  Grier Vu L  [facility] 05/24/2014, 1:28 PM

## 2014-05-24 NOTE — Progress Notes (Signed)
Discharge education, medication, and follow-up appts discussed with pts daughter, per pts request, pts daughter stated understanding and that she had no questions, prescriptions given, pt has lifevest on and has received education, pt getting dressed, wheelchair called, IV and tele removed Archie BalboaStein, Ulyana Pitones G, RN

## 2014-06-08 ENCOUNTER — Other Ambulatory Visit: Payer: Self-pay | Admitting: Internal Medicine

## 2014-07-23 ENCOUNTER — Other Ambulatory Visit: Payer: Self-pay | Admitting: Internal Medicine

## 2014-09-02 ENCOUNTER — Other Ambulatory Visit: Payer: Self-pay | Admitting: Internal Medicine

## 2014-10-04 ENCOUNTER — Other Ambulatory Visit: Payer: Self-pay | Admitting: Nurse Practitioner

## 2014-10-25 ENCOUNTER — Other Ambulatory Visit: Payer: Self-pay | Admitting: *Deleted

## 2014-10-25 MED ORDER — FINASTERIDE 5 MG PO TABS: ORAL_TABLET | ORAL | Status: DC

## 2014-10-25 NOTE — Telephone Encounter (Signed)
Patient walked in requesting 90 day supply faxed to his pharmacy

## 2014-11-01 ENCOUNTER — Encounter (HOSPITAL_COMMUNITY): Payer: Self-pay | Admitting: Cardiology

## 2015-08-26 DIAGNOSIS — H401112 Primary open-angle glaucoma, right eye, moderate stage: Secondary | ICD-10-CM | POA: Diagnosis not present

## 2015-09-09 DIAGNOSIS — I5020 Unspecified systolic (congestive) heart failure: Secondary | ICD-10-CM | POA: Diagnosis not present

## 2015-09-09 DIAGNOSIS — H401123 Primary open-angle glaucoma, left eye, severe stage: Secondary | ICD-10-CM | POA: Diagnosis not present

## 2015-10-10 DIAGNOSIS — I5020 Unspecified systolic (congestive) heart failure: Secondary | ICD-10-CM | POA: Diagnosis not present

## 2015-10-25 ENCOUNTER — Other Ambulatory Visit: Payer: Self-pay | Admitting: Internal Medicine

## 2015-10-25 NOTE — Telephone Encounter (Signed)
Refill once for 30 days. Ask him to make an appointment.

## 2015-10-25 NOTE — Telephone Encounter (Signed)
Dr.Green please advise on refill request, patient last OV was greater than 1 year ago. No pending appointment

## 2015-11-07 DIAGNOSIS — H401123 Primary open-angle glaucoma, left eye, severe stage: Secondary | ICD-10-CM | POA: Diagnosis not present

## 2015-11-07 DIAGNOSIS — H02833 Dermatochalasis of right eye, unspecified eyelid: Secondary | ICD-10-CM | POA: Diagnosis not present

## 2015-11-07 DIAGNOSIS — H401112 Primary open-angle glaucoma, right eye, moderate stage: Secondary | ICD-10-CM | POA: Diagnosis not present

## 2015-11-07 DIAGNOSIS — H04129 Dry eye syndrome of unspecified lacrimal gland: Secondary | ICD-10-CM | POA: Diagnosis not present

## 2015-11-09 DIAGNOSIS — I5020 Unspecified systolic (congestive) heart failure: Secondary | ICD-10-CM | POA: Diagnosis not present

## 2015-11-14 ENCOUNTER — Ambulatory Visit (INDEPENDENT_AMBULATORY_CARE_PROVIDER_SITE_OTHER): Payer: Medicare HMO | Admitting: Nurse Practitioner

## 2015-11-14 ENCOUNTER — Encounter: Payer: Self-pay | Admitting: Nurse Practitioner

## 2015-11-14 VITALS — BP 118/44 | HR 66 | Temp 97.4°F | Resp 20 | Ht 69.0 in | Wt 176.2 lb

## 2015-11-14 DIAGNOSIS — J209 Acute bronchitis, unspecified: Secondary | ICD-10-CM

## 2015-11-14 MED ORDER — DOXYCYCLINE HYCLATE 100 MG PO TABS: 100.0000 mg | ORAL_TABLET | Freq: Two times a day (BID) | ORAL | Status: DC

## 2015-11-14 NOTE — Progress Notes (Signed)
Patient ID: John Mason, male   DOB: Jul 14, 1929, 81 y.o.   MRN: [id number]    PCP: Kimber Relic, MD  Advanced Directive information Does patient have an advance directive?: Yes  No Known Allergies  Chief Complaint  Patient presents with  . Acute Visit    Patients c/o has had bad cough for   . OTHER    Daughter in room with patient     HPI: Patient is a 81 y.o. male seen in the office today due to cough. Has had a cough for 2-3 days. Had a sore throat and then developed into a cough. No fever some chill.  robitussin DM liquid every 4 hours. Helps some. Never sleeps good.  Chest congestion Chewing tobacco.  Does not drink enough water per daughter. Increased urinary frequency so he does not like to drink a lot of fluids Eating fair, baseline at this time.  Shortness of breath when he coughs, otherwise breathing at baseline. Uses O2 at night.   Review of Systems:  Review of Systems  Constitutional: Positive for chills. Negative for fever, activity change, appetite change and fatigue.  HENT: Positive for rhinorrhea. Negative for congestion, postnasal drip, sinus pressure, sneezing, sore throat and tinnitus.   Respiratory: Positive for cough. Negative for shortness of breath, wheezing and stridor.   Cardiovascular: Negative for chest pain and leg swelling.  Neurological: Negative for weakness.    Past Medical History  Diagnosis Date  . Urge incontinence   . Urinary frequency   . Unspecified vitamin D deficiency   . Contact dermatitis and other eczema due to other specified agent   . Pain in joint, lower leg   . Cognitive deficits, late effect of cerebrovascular disease   . Essential and other specified forms of tremor   . Obesity, unspecified   . First degree atrioventricular block   . Abnormality of gait   . Other abnormal blood chemistry   . Unspecified glaucoma   . Hypertrophy of prostate without urinary obstruction and other lower urinary tract symptoms (LUTS)     . Impotence of organic origin   . Actinic keratosis   . Insomnia, unspecified   . Elevated prostate specific antigen (PSA)   . Dizziness and giddiness   . Gastric ulcer, unspecified as acute or chronic, without mention of hemorrhage, perforation, or obstruction   . Unspecified essential hypertension   . STEMI (ST elevation myocardial infarction) (HCC) 05/07/2014   Past Surgical History  Procedure Laterality Date  . Appendectomy  1930's?  . Cyst excision Left 1977    "knee"  . Transurethral resection of prostate  1987  . Coronary angioplasty with stent placement  05/07/2014    "1"  . Multiple tooth extractions    . Cataract extraction w/ intraocular lens  implant, bilateral Bilateral   . Left heart catheterization with coronary angiogram N/A 05/07/2014    Procedure: LEFT HEART CATHETERIZATION WITH CORONARY ANGIOGRAM;  Surgeon: Pamella Pert, MD;  Location: [facility] CATH LAB;  Service: Cardiovascular;  Laterality: N/A;   Social History:   reports that he has quit smoking. His smoking use included Pipe. His smokeless tobacco use includes Chew. He reports that he does not drink alcohol or use illicit drugs.  Family History  Problem Relation Age of Onset  . Stroke Mother   . Stroke Sister     Medications: Patient's Medications  New Prescriptions   No medications on file  Previous Medications   ASPIRIN 81 MG CHEWABLE TABLET  Chew 81 mg by mouth daily.   ATORVASTATIN (LIPITOR) 10 MG TABLET    Take 10 mg by mouth daily.   FINASTERIDE (PROSCAR) 5 MG TABLET    TAKE 1 TABLET BY MOUTH DAILY FOR PROSTATE   HYDRALAZINE (APRESOLINE) 25 MG TABLET    Take 1 tablet (25 mg total) by mouth every 8 (eight) hours.   LATANOPROST (XALATAN) 0.005 % OPHTHALMIC SOLUTION    Place 1 drop into both eyes at bedtime.    LOSARTAN (COZAAR) 50 MG TABLET    Take 50 mg by mouth daily.   NIFEDIPINE (PROCARDIA XL/ADALAT-CC) 60 MG 24 HR TABLET    Take 60 mg by mouth daily.   NITROGLYCERIN (NITROSTAT) 0.4 MG SL  TABLET    Place 1 tablet (0.4 mg total) under the tongue every 5 (five) minutes as needed for chest pain.   PRASUGREL (EFFIENT) 10 MG TABS TABLET    Take 10 mg by mouth daily.   TIMOLOL (TIMOPTIC) 0.5 % OPHTHALMIC SOLUTION    Place 1 drop into both eyes daily.   Modified Medications   No medications on file  Discontinued Medications   BENZONATATE (TESSALON) 100 MG CAPSULE    Take 1 capsule (100 mg total) by mouth 3 (three) times daily as needed for cough.   CEFUROXIME (CEFTIN) 500 MG TABLET    Take 1 tablet (500 mg total) by mouth 2 (two) times daily with a meal.   FINASTERIDE (PROSCAR) 5 MG TABLET    Take 5 mg by mouth daily. For Prostate   FINASTERIDE (PROSCAR) 5 MG TABLET    APPOINTMENT OVERDUE, 1 by mouth daily for prostate   ISOSORBIDE MONONITRATE (IMDUR) 60 MG 24 HR TABLET    Take 1 tablet (60 mg total) by mouth daily.   LOSARTAN-HYDROCHLOROTHIAZIDE (HYZAAR) 100-25 MG PER TABLET    TAKE 1 TABLET DAILY TO CONTROL BLOOD PRESSURE   SOLIFENACIN (VESICARE) 10 MG TABLET    Take 10 mg by mouth daily. For bladder control     Physical Exam:  Filed Vitals:   11/14/15 1613  BP: 118/44  Pulse: 66  Temp: 97.4 F (36.3 C)  TempSrc: Oral  Resp: 20  Height: 5\' 9"  (1.753 m)  Weight: 176 lb 3.2 oz (79.924 kg)  SpO2: 96%   Body mass index is 26.01 kg/(m^2).  Physical Exam  Constitutional: He is oriented to person, place, and time. He appears well-developed and well-nourished. No distress.  HENT:  Head: Normocephalic and atraumatic.  Right Ear: External ear normal.  Left Ear: External ear normal.  Nose: Nose normal.  Mouth/Throat: Oropharynx is clear and moist. No oropharyngeal exudate.  Eyes: Conjunctivae and EOM are normal. Pupils are equal, round, and reactive to light.  Neck: Normal range of motion. Neck supple.  Cardiovascular: Normal rate, regular rhythm and normal heart sounds.   Pulmonary/Chest: Effort normal and breath sounds normal. No respiratory distress. He has no wheezes.    Neurological: He is alert and oriented to person, place, and time.  Skin: Skin is warm and dry. He is not diaphoretic.  Psychiatric: He has a normal mood and affect.    Labs reviewed: Basic Metabolic Panel: No results for input(s): NA, K, CL, CO2, GLUCOSE, BUN, CREATININE, CALCIUM, MG, PHOS, TSH in the last 8760 hours. Liver Function Tests: No results for input(s): AST, ALT, ALKPHOS, BILITOT, PROT, ALBUMIN in the last 8760 hours. No results for input(s): LIPASE, AMYLASE in the last 8760 hours. No results for input(s): AMMONIA in the last 8760  hours. CBC: No results for input(s): WBC, NEUTROABS, HGB, HCT, MCV, PLT in the last 8760 hours. Lipid Panel: No results for input(s): CHOL, HDL, LDLCALC, TRIG, CHOLHDL, LDLDIRECT in the last 8760 hours. TSH: No results for input(s): TSH in the last 8760 hours. A1C: Lab Results  Component Value Date   HGBA1C 6.3* 05/04/2014     Assessment/Plan 1. Acute bronchitis, unspecified organism -most likely viral, supportive care for now, cont mucinex DM twice daily  -may use tylenol as needed  -to have proper hydration and nutrition - doxycycline (VIBRA-TABS) 100 MG tablet; Take 1 tablet (100 mg total) by mouth 2 (two) times daily.  Dispense: 14 tablet; Refill: 0 Rx given due to holiday weekend, to start if symptoms worsen -instructions given as to when to seek emergency care -daughter and pt understands.     Janene Harvey. Biagio Borg  [facility] [phone] 8 am - 5 pm) [phone] (after hours)

## 2015-11-14 NOTE — Patient Instructions (Signed)
Cont to use Robitussin DM every 4 hours or Mucinex DM every 12 hours as needed for cough and congestion.--To increase water intake while taking- take a full glass of water with each dose  May use tylenol as needed for mild aches and pains  If symptoms worsen or persist over 1 week to start doxycyline 100 mg twice daily for 1 week Acute Bronchitis Bronchitis is inflammation of the airways that extend from the windpipe into the lungs (bronchi). The inflammation often causes mucus to develop. This leads to a cough, which is the most common symptom of bronchitis.  In acute bronchitis, the condition usually develops suddenly and goes away over time, usually in a couple weeks. Smoking, allergies, and asthma can make bronchitis worse. Repeated episodes of bronchitis may cause further lung problems.  CAUSES Acute bronchitis is most often caused by the same virus that causes a cold. The virus can spread from person to person (contagious) through coughing, sneezing, and touching contaminated objects. SIGNS AND SYMPTOMS   Cough.   Fever.   Coughing up mucus.   Body aches.   Chest congestion.   Chills.   Shortness of breath.   Sore throat.  DIAGNOSIS  Acute bronchitis is usually diagnosed through a physical exam. Your health care provider will also ask you questions about your medical history. Tests, such as chest X-rays, are sometimes done to rule out other conditions.  TREATMENT  Acute bronchitis usually goes away in a couple weeks. Oftentimes, no medical treatment is necessary. Medicines are sometimes given for relief of fever or cough. Antibiotic medicines are usually not needed but may be prescribed in certain situations. In some cases, an inhaler may be recommended to help reduce shortness of breath and control the cough. A cool mist vaporizer may also be used to help thin bronchial secretions and make it easier to clear the chest.  HOME CARE INSTRUCTIONS  Get plenty of rest.    Drink enough fluids to keep your urine clear or pale yellow (unless you have a medical condition that requires fluid restriction). Increasing fluids may help thin your respiratory secretions (sputum) and reduce chest congestion, and it will prevent dehydration.   Take medicines only as directed by your health care provider.  If you were prescribed an antibiotic medicine, finish it all even if you start to feel better.  Avoid smoking and secondhand smoke. Exposure to cigarette smoke or irritating chemicals will make bronchitis worse. If you are a smoker, consider using nicotine gum or skin patches to help control withdrawal symptoms. Quitting smoking will help your lungs heal faster.   Reduce the chances of another bout of acute bronchitis by washing your hands frequently, avoiding people with cold symptoms, and trying not to touch your hands to your mouth, nose, or eyes.   Keep all follow-up visits as directed by your health care provider.  SEEK MEDICAL CARE IF: Your symptoms do not improve after 1 week of treatment.  SEEK IMMEDIATE MEDICAL CARE IF:  You develop an increased fever or chills.   You have chest pain.   You have severe shortness of breath.  You have bloody sputum.   You develop dehydration.  You faint or repeatedly feel like you are going to pass out.  You develop repeated vomiting.  You develop a severe headache. MAKE SURE YOU:   Understand these instructions.  Will watch your condition.  Will get help right away if you are not doing well or get worse.   This information  is not intended to replace advice given to you by your health care provider. Make sure you discuss any questions you have with your health care provider.   Document Released: 12/17/2004 Document Revised: 11/30/2014 Document Reviewed: 05/02/2013 Elsevier Interactive Patient Education [employer].

## 2015-11-20 DIAGNOSIS — E7800 Pure hypercholesterolemia, unspecified: Secondary | ICD-10-CM | POA: Diagnosis not present

## 2015-11-20 DIAGNOSIS — I129 Hypertensive chronic kidney disease with stage 1 through stage 4 chronic kidney disease, or unspecified chronic kidney disease: Secondary | ICD-10-CM | POA: Diagnosis not present

## 2015-11-20 DIAGNOSIS — I2510 Atherosclerotic heart disease of native coronary artery without angina pectoris: Secondary | ICD-10-CM | POA: Diagnosis not present

## 2015-11-20 DIAGNOSIS — I351 Nonrheumatic aortic (valve) insufficiency: Secondary | ICD-10-CM | POA: Diagnosis not present

## 2015-12-06 ENCOUNTER — Other Ambulatory Visit: Payer: Self-pay | Admitting: Internal Medicine

## 2015-12-10 DIAGNOSIS — I5020 Unspecified systolic (congestive) heart failure: Secondary | ICD-10-CM | POA: Diagnosis not present

## 2015-12-17 ENCOUNTER — Ambulatory Visit (INDEPENDENT_AMBULATORY_CARE_PROVIDER_SITE_OTHER): Payer: Medicare HMO | Admitting: Internal Medicine

## 2015-12-17 ENCOUNTER — Encounter: Payer: Self-pay | Admitting: Internal Medicine

## 2015-12-17 VITALS — BP 130/68 | HR 57 | Temp 97.4°F | Resp 20 | Ht 69.0 in | Wt 175.6 lb

## 2015-12-17 DIAGNOSIS — R739 Hyperglycemia, unspecified: Secondary | ICD-10-CM

## 2015-12-17 DIAGNOSIS — I2510 Atherosclerotic heart disease of native coronary artery without angina pectoris: Secondary | ICD-10-CM | POA: Diagnosis not present

## 2015-12-17 DIAGNOSIS — I10 Essential (primary) hypertension: Secondary | ICD-10-CM | POA: Diagnosis not present

## 2015-12-17 DIAGNOSIS — G250 Essential tremor: Secondary | ICD-10-CM

## 2015-12-17 DIAGNOSIS — L570 Actinic keratosis: Secondary | ICD-10-CM | POA: Insufficient documentation

## 2015-12-17 MED ORDER — TRIAMCINOLONE ACETONIDE 0.1 % EX CREA: TOPICAL_CREAM | CUTANEOUS | Status: DC

## 2015-12-17 NOTE — Progress Notes (Signed)
Patient ID: John Mason, male   DOB: [birth year], 82 y.o.   MRN: [id number]    Facility  [facility]    Place of Service:   OFFICE    No Known Allergies  Chief Complaint  Patient presents with  . Medical Management of Chronic Issues    f/u per cardiologist for labs    HPI:  This is been over years since patient's last visit. He has been followed by Dr. Einar Gip following myocardial infarction.  Patient states he is doing well, but he is weak and is now living with his daughter.  Hyperglycemia - patient has not had checks of his blood sugar since 2015.  Essential hypertension - controlled   Actinic keratosis - scaly area on hands. Patient would like to have something to get rid of the scales. None are irritated or ulcerated.  Benign essential tremor - mild tremor at rest. No titubation. Does not interfere with meals.  Coronary artery disease involving native coronary artery of native heart without angina pectoris - no angina or palpitations.    Medications: Patient's Medications  New Prescriptions   No medications on file  Previous Medications   ASPIRIN 81 MG CHEWABLE TABLET    Chew 81 mg by mouth daily.   ATORVASTATIN (LIPITOR) 10 MG TABLET    Take 10 mg by mouth daily.   FINASTERIDE (PROSCAR) 5 MG TABLET    TAKE 1 TABLET BY MOUTH DAILY FOR PROSTATE, WILL NEED APPOINTMENT AFTER REFILL   HYDRALAZINE (APRESOLINE) 25 MG TABLET    Take 1 tablet (25 mg total) by mouth every 8 (eight) hours.   LATANOPROST (XALATAN) 0.005 % OPHTHALMIC SOLUTION    Place 1 drop into both eyes at bedtime.    LOSARTAN (COZAAR) 50 MG TABLET    Take 50 mg by mouth daily.   NIFEDIPINE (PROCARDIA XL/ADALAT-CC) 60 MG 24 HR TABLET    Take 60 mg by mouth daily.   NITROGLYCERIN (NITROSTAT) 0.4 MG SL TABLET    Place 1 tablet (0.4 mg total) under the tongue every 5 (five) minutes as needed for chest pain.   PRASUGREL (EFFIENT) 10 MG TABS TABLET    Take 10 mg by mouth daily.   TIMOLOL (TIMOPTIC) 0.5 % OPHTHALMIC  SOLUTION    Place 1 drop into both eyes daily.   Modified Medications   No medications on file  Discontinued Medications   DOXYCYCLINE (VIBRA-TABS) 100 MG TABLET    Take 1 tablet (100 mg total) by mouth 2 (two) times daily.   FINASTERIDE (PROSCAR) 5 MG TABLET    TAKE 1 TABLET BY MOUTH DAILY FOR PROSTATE    Review of Systems  Constitutional: Positive for chills. Negative for fever, activity change, appetite change and fatigue.  HENT: Positive for rhinorrhea. Negative for congestion, postnasal drip, sinus pressure, sneezing, sore throat and tinnitus.   Respiratory: Positive for cough. Negative for shortness of breath, wheezing and stridor.   Cardiovascular: Negative for chest pain and leg swelling.  Gastrointestinal: Negative for nausea, abdominal pain, diarrhea, constipation, blood in stool and anal bleeding.  Endocrine:       Hyperglycemia  Genitourinary:       Past history of intermittent urinary incontinence. He says this is currently resolved.  Musculoskeletal: Positive for back pain.  Skin:       Lesions on hands  Allergic/Immunologic: Negative.   Neurological: Positive for tremors. Negative for dizziness, seizures, facial asymmetry, weakness, numbness and headaches.  Hematological: Negative.   Psychiatric/Behavioral: Negative.  Filed Vitals:   12/17/15 1540  BP: 130/68  Pulse: 57  Temp: 97.4 F (36.3 C)  TempSrc: Oral  Resp: 20  Height: 5' 9"  (1.753 m)  Weight: 175 lb 9.6 oz (79.652 kg)  SpO2: 92%   Body mass index is 25.92 kg/(m^2). Filed Weights   12/17/15 1540  Weight: 175 lb 9.6 oz (79.652 kg)     Physical Exam  Constitutional: He is oriented to person, place, and time. He appears well-developed and well-nourished. No distress.  HENT:  Head: Normocephalic and atraumatic.  Right Ear: External ear normal.  Left Ear: External ear normal.  Nose: Nose normal.  Mouth/Throat: Oropharynx is clear and moist. No oropharyngeal exudate.  Eyes: Conjunctivae and  EOM are normal. Pupils are equal, round, and reactive to light.  Neck: Normal range of motion. Neck supple. No JVD present. No tracheal deviation present. No thyromegaly present.  Cardiovascular: Normal rate, regular rhythm, normal heart sounds and intact distal pulses.  Exam reveals no gallop and no friction rub.   No murmur heard. Pulmonary/Chest: Effort normal and breath sounds normal. No respiratory distress. He has no wheezes. He has no rales. He exhibits no tenderness.  Abdominal: He exhibits no distension and no mass. There is no tenderness.  Musculoskeletal: Normal range of motion. He exhibits no edema or tenderness.  Lymphadenopathy:    He has no cervical adenopathy.  Neurological: He is alert and oriented to person, place, and time. He has normal reflexes. No cranial nerve deficit. Coordination normal.  Skin: Skin is warm and dry. No rash noted. He is not diaphoretic. No erythema. No pallor.  Multiple seborrheic keratoses  Psychiatric: He has a normal mood and affect. His behavior is normal. Judgment and thought content normal.    Labs reviewed: Lab Summary Latest Ref Rng 05/23/2014 05/22/2014 05/08/2014 05/07/2014 05/07/2014 05/07/2014 05/07/2014  Hemoglobin 13.0 - 17.0 g/dL 11.0(L) 10.5(L) 12.2(L) (None) (None) (None) 13.6  Hematocrit 39.0 - 52.0 % 33.4(L) 32.0(L) 37.0(L) (None) (None) (None) 41.4  White count 4.0 - 10.5 K/uL 6.5 7.0 12.2(H) (None) (None) (None) 11.1(H)  Platelet count 150 - 400 K/uL 138(L) 134(L) 118(L) (None) (None) (None) 125(L)  Sodium 137 - 147 mEq/L 133(L) 138 137 141 (None) (None) 143  Potassium 3.7 - 5.3 mEq/L 4.4 4.9 4.0 4.9 (None) (None) 4.1  Calcium 8.4 - 10.5 mg/dL 9.0 8.9 8.9 9.5 (None) (None) 9.7  Phosphorus - (None) (None) (None) (None) (None) (None) (None)  Creatinine 0.50 - 1.35 mg/dL 1.34 1.45(H) 0.98 1.07 (None) (None) 1.30  AST - (None) (None) (None) (None) (None) (None) (None)  Alk Phos - (None) (None) (None) (None) (None) (None) (None)  Bilirubin  - (None) (None) (None) (None) (None) (None) (None)  Glucose 70 - 99 mg/dL 95 136(H) 128(H) 127(H) (None) (None) 214(H)  Cholesterol 0 - 200 mg/dL (None) (None) (None) (None) 134 116 (None)  HDL cholesterol >39 mg/dL (None) (None) (None) (None) 53 40 (None)  Triglycerides <150 mg/dL (None) (None) (None) (None) 88 50 (None)  LDL Direct - (None) (None) (None) (None) (None) (None) (None)  LDL Calc 0 - 99 mg/dL (None) (None) (None) (None) 63 66 (None)  Total protein - (None) (None) (None) (None) (None) (None) (None)  Albumin - (None) (None) (None) (None) (None) (None) (None)   Lab Results  Component Value Date   TSH 0.930 05/22/2014   TSH 1.670 05/07/2014   Lab Results  Component Value Date   BUN 24* 05/23/2014   BUN 23 05/22/2014   BUN 22 05/08/2014  Lab Results  Component Value Date   HGBA1C 6.3* 05/04/2014   HGBA1C 6.2* 10/02/2013   HGBA1C 6.1* 03/24/2013    Assessment/Plan  1. Hyperglycemia Follow-up lab - Hemoglobin A1c; Future - Comprehensive metabolic panel; Future - Microalbumin, urine; Future  2. Essential hypertension - Comprehensive metabolic panel; Future  3. Actinic keratosis - triamcinolone cream (KENALOG) 0.1 %; Apply sparingly up to twice daily to scaley areas on hands  Dispense: 30 g; Refill: 4  4. Benign essential tremor No treatment needed at this time  5. Coronary artery disease involving native coronary artery of native heart without angina pectoris - Lipid panel; Future

## 2015-12-20 ENCOUNTER — Other Ambulatory Visit: Payer: Medicare HMO

## 2015-12-20 DIAGNOSIS — I2510 Atherosclerotic heart disease of native coronary artery without angina pectoris: Secondary | ICD-10-CM

## 2015-12-20 DIAGNOSIS — I10 Essential (primary) hypertension: Secondary | ICD-10-CM | POA: Diagnosis not present

## 2015-12-20 DIAGNOSIS — R739 Hyperglycemia, unspecified: Secondary | ICD-10-CM | POA: Diagnosis not present

## 2015-12-21 LAB — HEMOGLOBIN A1C
Est. average glucose Bld gHb Est-mCnc: 128 mg/dL
Hgb A1c MFr Bld: 6.1 % — ABNORMAL HIGH (ref 4.8–5.6)

## 2015-12-21 LAB — LIPID PANEL
Chol/HDL Ratio: 2.3 ratio units (ref 0.0–5.0)
Cholesterol, Total: 108 mg/dL (ref 100–199)
HDL: 47 mg/dL (ref >39)
LDL Calculated: 45 mg/dL (ref 0–99)
TRIGLYCERIDES: 78 mg/dL (ref 0–149)
VLDL Cholesterol Cal: 16 mg/dL (ref 5–40)

## 2015-12-21 LAB — COMPREHENSIVE METABOLIC PANEL
A/G RATIO: 2 (ref 1.1–2.5)
ALK PHOS: 74 IU/L (ref 39–117)
ALT: 8 IU/L (ref 0–44)
AST: 10 IU/L (ref 0–40)
Albumin: 4.1 g/dL (ref 3.5–4.7)
BUN/Creatinine Ratio: 17 (ref 10–22)
BUN: 17 mg/dL (ref 8–27)
Bilirubin Total: 0.5 mg/dL (ref 0.0–1.2)
CHLORIDE: 102 mmol/L (ref 96–106)
CO2: 23 mmol/L (ref 18–29)
CREATININE: 1 mg/dL (ref 0.76–1.27)
Calcium: 9.2 mg/dL (ref 8.6–10.2)
GFR calc Af Amer: 80 mL/min/{1.73_m2} (ref >59)
GFR calc non Af Amer: 69 mL/min/{1.73_m2} (ref >59)
GLOBULIN, TOTAL: 2.1 g/dL (ref 1.5–4.5)
Glucose: 108 mg/dL — ABNORMAL HIGH (ref 65–99)
POTASSIUM: 4.3 mmol/L (ref 3.5–5.2)
SODIUM: 139 mmol/L (ref 134–144)
Total Protein: 6.2 g/dL (ref 6.0–8.5)

## 2015-12-21 LAB — MICROALBUMIN, URINE: Microalbumin, Urine: 663.3 ug/mL

## 2016-01-10 DIAGNOSIS — I5020 Unspecified systolic (congestive) heart failure: Secondary | ICD-10-CM | POA: Diagnosis not present

## 2016-02-07 DIAGNOSIS — I5020 Unspecified systolic (congestive) heart failure: Secondary | ICD-10-CM | POA: Diagnosis not present

## 2016-02-17 ENCOUNTER — Other Ambulatory Visit: Payer: Self-pay | Admitting: Internal Medicine

## 2016-03-09 DIAGNOSIS — I5020 Unspecified systolic (congestive) heart failure: Secondary | ICD-10-CM | POA: Diagnosis not present

## 2016-04-08 DIAGNOSIS — I5020 Unspecified systolic (congestive) heart failure: Secondary | ICD-10-CM | POA: Diagnosis not present

## 2016-04-15 ENCOUNTER — Encounter: Payer: Self-pay | Admitting: Internal Medicine

## 2016-04-15 ENCOUNTER — Other Ambulatory Visit: Payer: Medicare HMO

## 2016-04-15 ENCOUNTER — Ambulatory Visit (INDEPENDENT_AMBULATORY_CARE_PROVIDER_SITE_OTHER): Payer: Medicare HMO | Admitting: Internal Medicine

## 2016-04-15 VITALS — BP 130/56 | HR 56 | Temp 97.6°F | Ht 69.0 in | Wt 174.0 lb

## 2016-04-15 DIAGNOSIS — E785 Hyperlipidemia, unspecified: Secondary | ICD-10-CM

## 2016-04-15 DIAGNOSIS — H409 Unspecified glaucoma: Secondary | ICD-10-CM | POA: Diagnosis not present

## 2016-04-15 DIAGNOSIS — N3941 Urge incontinence: Secondary | ICD-10-CM

## 2016-04-15 DIAGNOSIS — D649 Anemia, unspecified: Secondary | ICD-10-CM | POA: Diagnosis not present

## 2016-04-15 DIAGNOSIS — R739 Hyperglycemia, unspecified: Secondary | ICD-10-CM | POA: Diagnosis not present

## 2016-04-15 DIAGNOSIS — I10 Essential (primary) hypertension: Secondary | ICD-10-CM

## 2016-04-15 DIAGNOSIS — I2510 Atherosclerotic heart disease of native coronary artery without angina pectoris: Secondary | ICD-10-CM | POA: Diagnosis not present

## 2016-04-15 DIAGNOSIS — R413 Other amnesia: Secondary | ICD-10-CM

## 2016-04-15 DIAGNOSIS — E119 Type 2 diabetes mellitus without complications: Secondary | ICD-10-CM | POA: Diagnosis not present

## 2016-04-15 NOTE — Progress Notes (Signed)
Patient ID: John Mason, male   DOB: 1929-11-05, 82 y.o.   MRN: [id number]    Facility  [facility]    Place of Service:   OFFICE    No Known Allergies  Chief Complaint  Patient presents with  . Medical Management of Chronic Issues    4 month medication management blood pressure, hyperglycemia, CAD,   . 6CIT Dementia test    total score 12    HPI:  Essential hypertension - controlled  Hyperglycemia - follow up lab needed  Coronary artery disease involving native coronary artery of native heart without angina pectoris - one ain last month. Did not use NTG. Did not tell anyone.   Patient believes memory is OK, but he scores CIT-6 as memory impaired.  Medications: Patient's Medications  New Prescriptions   No medications on file  Previous Medications   ASPIRIN 81 MG CHEWABLE TABLET    Chew 81 mg by mouth daily.   ATORVASTATIN (LIPITOR) 10 MG TABLET    Take 10 mg by mouth daily.   FINASTERIDE (PROSCAR) 5 MG TABLET    1 by mouth daily for prostate   HYDRALAZINE (APRESOLINE) 25 MG TABLET    Take 1 tablet (25 mg total) by mouth every 8 (eight) hours.   LATANOPROST (XALATAN) 0.005 % OPHTHALMIC SOLUTION    Place 1 drop into both eyes at bedtime.    LOSARTAN (COZAAR) 50 MG TABLET    Take 50 mg by mouth daily.   NIFEDIPINE (PROCARDIA XL/ADALAT-CC) 60 MG 24 HR TABLET    Take 60 mg by mouth daily.   NITROGLYCERIN (NITROSTAT) 0.4 MG SL TABLET    Place 1 tablet (0.4 mg total) under the tongue every 5 (five) minutes as needed for chest pain.   PRASUGREL (EFFIENT) 10 MG TABS TABLET    Take 10 mg by mouth daily.   TIMOLOL (TIMOPTIC) 0.5 % OPHTHALMIC SOLUTION    Place 1 drop into both eyes daily.    TRIAMCINOLONE CREAM (KENALOG) 0.1 %    Apply sparingly up to twice daily to scaley areas on hands  Modified Medications   No medications on file  Discontinued Medications   No medications on file    Review of Systems  Constitutional: Positive for chills. Negative for fever, activity change,  appetite change and fatigue.  HENT: Positive for rhinorrhea. Negative for congestion, postnasal drip, sinus pressure, sneezing, sore throat and tinnitus.   Respiratory: Positive for cough. Negative for shortness of breath, wheezing and stridor.   Cardiovascular: Negative for chest pain and leg swelling.  Gastrointestinal: Negative for nausea, abdominal pain, diarrhea, constipation, blood in stool and anal bleeding.  Endocrine:       Hyperglycemia  Genitourinary:       Past history of intermittent urinary incontinence. He says this is currently resolved.  Musculoskeletal: Positive for back pain.  Skin:       Lesions on hands  Allergic/Immunologic: Negative.   Neurological: Positive for tremors and weakness. Negative for dizziness, seizures, facial asymmetry, numbness and headaches.       04/15/16 CIT -6  12/28.   Hematological: Negative.   Psychiatric/Behavioral: Negative.     Filed Vitals:   04/15/16 1209  BP: 130/56  Pulse: 56  Temp: 97.6 F (36.4 C)  TempSrc: Oral  Height: 5' 9" (1.753 m)  Weight: 174 lb (78.926 kg)  SpO2: 96%   Body mass index is 25.68 kg/(m^2). Filed Weights   04/15/16 1209  Weight: 174 lb (78.926 kg)  Physical Exam  Constitutional: He is oriented to person, place, and time. He appears well-developed and well-nourished. No distress.  HENT:  Head: Normocephalic and atraumatic.  Right Ear: External ear normal.  Left Ear: External ear normal.  Nose: Nose normal.  Mouth/Throat: Oropharynx is clear and moist. No oropharyngeal exudate.  Eyes: Conjunctivae and EOM are normal. Pupils are equal, round, and reactive to light.  Neck: Normal range of motion. Neck supple. No JVD present. No tracheal deviation present. No thyromegaly present.  Cardiovascular: Normal rate, regular rhythm, normal heart sounds and intact distal pulses.  Exam reveals no gallop and no friction rub.   No murmur heard. Pulmonary/Chest: Effort normal and breath sounds normal. No  respiratory distress. He has no wheezes. He has no rales. He exhibits no tenderness.  Abdominal: He exhibits no distension and no mass. There is no tenderness.  Musculoskeletal: Normal range of motion. He exhibits no edema or tenderness.  Lymphadenopathy:    He has no cervical adenopathy.  Neurological: He is alert and oriented to person, place, and time. He has normal reflexes. No cranial nerve deficit. Coordination normal.  Skin: Skin is warm and dry. No rash noted. He is not diaphoretic. No erythema. No pallor.  Multiple seborrheic keratoses  Psychiatric: He has a normal mood and affect. His behavior is normal. Judgment and thought content normal.    Labs reviewed: Lab Summary Latest Ref Rng 12/20/2015 05/23/2014 05/22/2014  Hemoglobin 13.0 - 17.0 g/dL (None) 11.0(L) 10.5(L)  Hematocrit 39.0 - 52.0 % (None) 33.4(L) 32.0(L)  White count 4.0 - 10.5 K/uL (None) 6.5 7.0  Platelet count 150 - 400 K/uL (None) 138(L) 134(L)  Sodium 134 - 144 mmol/L 139 133(L) 138  Potassium 3.5 - 5.2 mmol/L 4.3 4.4 4.9  Calcium 8.6 - 10.2 mg/dL 9.2 9.0 8.9  Phosphorus - (None) (None) (None)  Creatinine 0.76 - 1.27 mg/dL 1.00 1.34 1.45(H)  AST 0 - 40 IU/L 10 (None) (None)  Alk Phos 39 - 117 IU/L 74 (None) (None)  Bilirubin 0.0 - 1.2 mg/dL 0.5 (None) (None)  Glucose 65 - 99 mg/dL 108(H) 95 136(H)  Cholesterol - (None) (None) (None)  HDL cholesterol >39 mg/dL 47 (None) (None)  Triglycerides 0 - 149 mg/dL 78 (None) (None)  LDL Direct - (None) (None) (None)  LDL Calc 0 - 99 mg/dL 45 (None) (None)  Total protein - (None) (None) (None)  Albumin 3.5 - 4.7 g/dL 4.1 (None) (None)   Lab Results  Component Value Date   TSH 0.930 05/22/2014   TSH 1.670 05/07/2014   Lab Results  Component Value Date   BUN 17 12/20/2015   BUN 24* 05/23/2014   BUN 23 05/22/2014   Lab Results  Component Value Date   HGBA1C 6.1* 12/20/2015   HGBA1C 6.3* 05/04/2014   HGBA1C 6.2* 10/02/2013    Assessment/Plan  1. Essential  hypertension - CMP  2. Hyperglycemia - CMP - Hemoglobin A1c  3. Coronary artery disease involving native coronary artery of native heart without angina pectoris stable  4. Glaucoma stable  5. Anemia, unspecified anemia type - CBC With Differential  6. Memory loss Follow up with [facility] next visit  7. Urgency incontinence improved  8. HLD (hyperlipidemia) - Lipid panel  9. Type 2 diabetes mellitus without complication, without long-term current use of insulin (HCC) -CMP, A1c

## 2016-04-16 LAB — CBC WITH DIFFERENTIAL
BASOS ABS: 0 10*3/uL (ref 0.0–0.2)
Basos: 1 %
EOS (ABSOLUTE): 0.3 10*3/uL (ref 0.0–0.4)
Eos: 6 %
Hematocrit: 38.8 % (ref 37.5–51.0)
Hemoglobin: 13.1 g/dL (ref 12.6–17.7)
IMMATURE GRANS (ABS): 0 10*3/uL (ref 0.0–0.1)
Immature Granulocytes: 0 %
LYMPHS: 22 %
Lymphocytes Absolute: 1.4 10*3/uL (ref 0.7–3.1)
MCH: 29 pg (ref 26.6–33.0)
MCHC: 33.8 g/dL (ref 31.5–35.7)
MCV: 86 fL (ref 79–97)
MONOCYTES: 7 %
Monocytes Absolute: 0.4 10*3/uL (ref 0.1–0.9)
NEUTROS PCT: 64 %
Neutrophils Absolute: 3.9 10*3/uL (ref 1.4–7.0)
RBC: 4.51 x10E6/uL (ref 4.14–5.80)
RDW: 14.6 % (ref 12.3–15.4)
WBC: 6 10*3/uL (ref 3.4–10.8)

## 2016-04-16 LAB — LIPID PANEL
Chol/HDL Ratio: 2.4 ratio units (ref 0.0–5.0)
Cholesterol, Total: 118 mg/dL (ref 100–199)
HDL: 49 mg/dL (ref >39)
LDL CALC: 23 mg/dL (ref 0–99)
Triglycerides: 228 mg/dL — ABNORMAL HIGH (ref 0–149)
VLDL CHOLESTEROL CAL: 46 mg/dL — AB (ref 5–40)

## 2016-04-16 LAB — HEMOGLOBIN A1C
Est. average glucose Bld gHb Est-mCnc: 137 mg/dL
HEMOGLOBIN A1C: 6.4 % — AB (ref 4.8–5.6)

## 2016-04-16 LAB — COMPREHENSIVE METABOLIC PANEL
ALT: 11 IU/L (ref 0–44)
AST: 15 IU/L (ref 0–40)
Albumin/Globulin Ratio: 2 (ref 1.2–2.2)
Albumin: 4.2 g/dL (ref 3.5–4.7)
Alkaline Phosphatase: 70 IU/L (ref 39–117)
BUN/Creatinine Ratio: 15 (ref 10–24)
BUN: 17 mg/dL (ref 8–27)
Bilirubin Total: 0.4 mg/dL (ref 0.0–1.2)
CO2: 22 mmol/L (ref 18–29)
CREATININE: 1.13 mg/dL (ref 0.76–1.27)
Calcium: 8.9 mg/dL (ref 8.6–10.2)
Chloride: 102 mmol/L (ref 96–106)
GFR calc Af Amer: 68 mL/min/{1.73_m2} (ref >59)
GFR calc non Af Amer: 59 mL/min/{1.73_m2} — ABNORMAL LOW (ref >59)
GLUCOSE: 124 mg/dL — AB (ref 65–99)
Globulin, Total: 2.1 g/dL (ref 1.5–4.5)
Potassium: 4.4 mmol/L (ref 3.5–5.2)
Sodium: 141 mmol/L (ref 134–144)
Total Protein: 6.3 g/dL (ref 6.0–8.5)

## 2016-05-09 DIAGNOSIS — I5020 Unspecified systolic (congestive) heart failure: Secondary | ICD-10-CM | POA: Diagnosis not present

## 2016-05-20 DIAGNOSIS — I351 Nonrheumatic aortic (valve) insufficiency: Secondary | ICD-10-CM | POA: Diagnosis not present

## 2016-05-20 DIAGNOSIS — E7800 Pure hypercholesterolemia, unspecified: Secondary | ICD-10-CM | POA: Diagnosis not present

## 2016-05-20 DIAGNOSIS — I129 Hypertensive chronic kidney disease with stage 1 through stage 4 chronic kidney disease, or unspecified chronic kidney disease: Secondary | ICD-10-CM | POA: Diagnosis not present

## 2016-05-20 DIAGNOSIS — I2510 Atherosclerotic heart disease of native coronary artery without angina pectoris: Secondary | ICD-10-CM | POA: Diagnosis not present

## 2016-06-08 DIAGNOSIS — I5020 Unspecified systolic (congestive) heart failure: Secondary | ICD-10-CM | POA: Diagnosis not present

## 2016-06-11 ENCOUNTER — Encounter: Payer: Self-pay | Admitting: Internal Medicine

## 2016-07-09 DIAGNOSIS — I5020 Unspecified systolic (congestive) heart failure: Secondary | ICD-10-CM | POA: Diagnosis not present

## 2016-08-06 ENCOUNTER — Other Ambulatory Visit: Payer: Self-pay | Admitting: Internal Medicine

## 2016-08-09 DIAGNOSIS — I5020 Unspecified systolic (congestive) heart failure: Secondary | ICD-10-CM | POA: Diagnosis not present

## 2016-08-26 ENCOUNTER — Encounter: Payer: Self-pay | Admitting: Internal Medicine

## 2016-08-26 ENCOUNTER — Ambulatory Visit (INDEPENDENT_AMBULATORY_CARE_PROVIDER_SITE_OTHER): Payer: Medicare HMO | Admitting: Internal Medicine

## 2016-08-26 VITALS — BP 136/60 | HR 56 | Temp 97.8°F | Ht 69.0 in | Wt 178.8 lb

## 2016-08-26 DIAGNOSIS — R05 Cough: Secondary | ICD-10-CM | POA: Diagnosis not present

## 2016-08-26 DIAGNOSIS — D649 Anemia, unspecified: Secondary | ICD-10-CM | POA: Diagnosis not present

## 2016-08-26 DIAGNOSIS — E785 Hyperlipidemia, unspecified: Secondary | ICD-10-CM | POA: Diagnosis not present

## 2016-08-26 DIAGNOSIS — E119 Type 2 diabetes mellitus without complications: Secondary | ICD-10-CM

## 2016-08-26 DIAGNOSIS — R059 Cough, unspecified: Secondary | ICD-10-CM

## 2016-08-26 DIAGNOSIS — I2510 Atherosclerotic heart disease of native coronary artery without angina pectoris: Secondary | ICD-10-CM

## 2016-08-26 DIAGNOSIS — R609 Edema, unspecified: Secondary | ICD-10-CM

## 2016-08-26 LAB — LIPID PANEL
CHOLESTEROL: 103 mg/dL — AB (ref 125–200)
HDL: 50 mg/dL (ref >=40)
LDL CALC: 32 mg/dL (ref <130)
TRIGLYCERIDES: 103 mg/dL (ref <150)
Total CHOL/HDL Ratio: 2.1 Ratio (ref <=5.0)
VLDL: 21 mg/dL (ref <30)

## 2016-08-26 LAB — CBC WITH DIFFERENTIAL/PLATELET
BASOS PCT: 1 %
Basophils Absolute: 71 cells/uL (ref 0–200)
EOS PCT: 5 %
Eosinophils Absolute: 355 cells/uL (ref 15–500)
HEMATOCRIT: 38 % — AB (ref 38.5–50.0)
HEMOGLOBIN: 12.4 g/dL — AB (ref 13.2–17.1)
LYMPHS ABS: 1207 {cells}/uL (ref 850–3900)
Lymphocytes Relative: 17 %
MCH: 28.7 pg (ref 27.0–33.0)
MCHC: 32.6 g/dL (ref 32.0–36.0)
MCV: 88 fL (ref 80.0–100.0)
MONO ABS: 639 {cells}/uL (ref 200–950)
MPV: 9.1 fL (ref 7.5–12.5)
Monocytes Relative: 9 %
NEUTROS ABS: 4828 {cells}/uL (ref 1500–7800)
Neutrophils Relative %: 68 %
Platelets: 141 10*3/uL (ref 140–400)
RBC: 4.32 MIL/uL (ref 4.20–5.80)
RDW: 14 % (ref 11.0–15.0)
WBC: 7.1 10*3/uL (ref 3.8–10.8)

## 2016-08-26 LAB — BASIC METABOLIC PANEL
BUN: 17 mg/dL (ref 7–25)
CALCIUM: 9.1 mg/dL (ref 8.6–10.3)
CO2: 25 mmol/L (ref 20–31)
Chloride: 107 mmol/L (ref 98–110)
Creat: 1.07 mg/dL (ref 0.70–1.11)
Glucose, Bld: 109 mg/dL — ABNORMAL HIGH (ref 65–99)
POTASSIUM: 4.5 mmol/L (ref 3.5–5.3)
SODIUM: 141 mmol/L (ref 135–146)

## 2016-08-26 NOTE — Progress Notes (Signed)
Facility  [facility]    Place of Service:   OFFICE    No Known Allergies  Chief Complaint  Patient presents with  . Medical Management of Chronic Issues    4 month follow-up, DM foot exam due   . Cough    Patient c/o cough at night that delays sleep  . Immunizations    Refused vaccine updates     HPI:  Type 2 diabetes mellitus without complication, without long-term current use of insulin (HCC) - needs lab  Anemia, unspecified type - needs lab  Hyperlipidemia, unspecified hyperlipidemia type - needs lab  Coronary artery disease involving native coronary artery of native heart without angina pectoris - no angina or palpitations  Cough - nocturnal and dry  Edema, unspecified type - unchanged ad abour 1-2 + of both feet    Medications: Patient's Medications  New Prescriptions   No medications on file  Previous Medications   ASPIRIN 81 MG CHEWABLE TABLET    Chew 81 mg by mouth daily.   ATORVASTATIN (LIPITOR) 10 MG TABLET    Take 10 mg by mouth daily.   FINASTERIDE (PROSCAR) 5 MG TABLET    TAKE 1 TABLET BY MOUTH DAILY FOR PROSTATE   HYDRALAZINE (APRESOLINE) 25 MG TABLET    Take 1 tablet (25 mg total) by mouth every 8 (eight) hours.   LATANOPROST (XALATAN) 0.005 % OPHTHALMIC SOLUTION    Place 1 drop into both eyes at bedtime.    LOSARTAN (COZAAR) 50 MG TABLET    Take 50 mg by mouth daily.   NIFEDIPINE (PROCARDIA XL/ADALAT-CC) 60 MG 24 HR TABLET    Take 60 mg by mouth daily.   NITROGLYCERIN (NITROSTAT) 0.4 MG SL TABLET    Place 1 tablet (0.4 mg total) under the tongue every 5 (five) minutes as needed for chest pain.   PRASUGREL (EFFIENT) 10 MG TABS TABLET    Take 10 mg by mouth daily.   TIMOLOL (TIMOPTIC) 0.5 % OPHTHALMIC SOLUTION    Place 1 drop into both eyes daily.    TRIAMCINOLONE CREAM (KENALOG) 0.1 %    Apply sparingly up to twice daily to scaley areas on hands  Modified Medications   No medications on file  Discontinued Medications   No medications on file     Review of Systems  Constitutional: Positive for chills. Negative for activity change, appetite change, fatigue and fever.  HENT: Positive for rhinorrhea. Negative for congestion, postnasal drip, sinus pressure, sneezing, sore throat and tinnitus.   Respiratory: Positive for cough (dry nocturnal). Negative for shortness of breath, wheezing and stridor.   Cardiovascular: Positive for leg swelling. Negative for chest pain.  Gastrointestinal: Negative for abdominal pain, anal bleeding, blood in stool, constipation, diarrhea and nausea.  Endocrine:       Hyperglycemia  Genitourinary:       Past history of intermittent urinary incontinence. He says this is currently resolved.  Musculoskeletal: Positive for back pain.  Skin:       Lesions on hands  Allergic/Immunologic: Negative.   Neurological: Positive for tremors and weakness. Negative for dizziness, seizures, facial asymmetry, numbness and headaches.       04/15/16 CIT -6  12/28.   Hematological: Negative.   Psychiatric/Behavioral: Negative.     Vitals:   08/26/16 1247  BP: 136/60  Pulse: (!) 56  Temp: 97.8 F (36.6 C)  TempSrc: Oral  SpO2: 94%  Weight: 178 lb 12.8 oz (81.1 kg)  Height: _0  (1.753 m)  Body mass index is 26.4 kg/m. Wt Readings from Last 3 Encounters:  08/26/16 178 lb 12.8 oz (81.1 kg)  04/15/16 174 lb (78.9 kg)  12/17/15 175 lb 9.6 oz (79.7 kg)      Physical Exam  Constitutional: He is oriented to person, place, and time. He appears well-developed and well-nourished. No distress.  HENT:  Head: Normocephalic and atraumatic.  Right Ear: External ear normal.  Left Ear: External ear normal.  Nose: Nose normal.  Mouth/Throat: Oropharynx is clear and moist. No oropharyngeal exudate.  Eyes: Conjunctivae and EOM are normal. Pupils are equal, round, and reactive to light.  Neck: Normal range of motion. Neck supple. No JVD present. No tracheal deviation present. No thyromegaly present.  Cardiovascular:  Normal rate, regular rhythm, normal heart sounds and intact distal pulses.  Exam reveals no gallop and no friction rub.   No murmur heard. Pulmonary/Chest: Effort normal and breath sounds normal. No respiratory distress. He has no wheezes. He has no rales. He exhibits no tenderness.  Abdominal: He exhibits no distension and no mass. There is no tenderness.  Musculoskeletal: Normal range of motion. He exhibits edema. He exhibits no tenderness.  Lymphadenopathy:    He has no cervical adenopathy.  Neurological: He is alert and oriented to person, place, and time. He has normal reflexes. No cranial nerve deficit. Coordination normal.  Diminished vibratory and monofilament sensation bilaterally.  Skin: Skin is warm and dry. No rash noted. He is not diaphoretic. No erythema. No pallor.  Multiple seborrheic keratoses  Psychiatric: He has a normal mood and affect. His behavior is normal. Judgment and thought content normal.    Labs reviewed: Lab Summary Latest Ref Rng & Units 04/15/2016 12/20/2015 05/23/2014 05/22/2014  Hemoglobin 12.6 - 17.7 g/dL 13.1 (None) 11.0(L) 10.5(L)  Hematocrit 37.5 - 51.0 % 38.8 (None) 33.4(L) 32.0(L)  White count 3.4 - 10.8 x10E3/uL 6.0 (None) 6.5 7.0  Platelet count 150 - 400 K/uL (None) (None) 138(L) 134(L)  Sodium 134 - 144 mmol/L 141 139 133(L) 138  Potassium 3.5 - 5.2 mmol/L 4.4 4.3 4.4 4.9  Calcium 8.6 - 10.2 mg/dL 8.9 9.2 9.0 8.9  Phosphorus - (None) (None) (None) (None)  Creatinine 0.76 - 1.27 mg/dL 1.13 1.00 1.34 1.45(H)  AST 0 - 40 IU/L 15 10 (None) (None)  Alk Phos 39 - 117 IU/L 70 74 (None) (None)  Bilirubin 0.0 - 1.2 mg/dL 0.4 0.5 (None) (None)  Glucose 65 - 99 mg/dL 124(H) 108(H) 95 136(H)  Cholesterol - (None) (None) (None) (None)  HDL cholesterol >39 mg/dL 49 47 (None) (None)  Triglycerides 0 - 149 mg/dL 228(H) 78 (None) (None)  LDL Direct - (None) (None) (None) (None)  LDL Calc 0 - 99 mg/dL 23 45 (None) (None)  Total protein - (None) (None) (None)  (None)  Albumin 3.5 - 4.7 g/dL 4.2 4.1 (None) (None)  Some recent data might be hidden   Lab Results  Component Value Date   TSH 0.930 05/22/2014   TSH 1.670 05/07/2014   Lab Results  Component Value Date   BUN 17 04/15/2016   BUN 17 12/20/2015   BUN 24 (H) 05/23/2014   Lab Results  Component Value Date   HGBA1C 6.4 (H) 04/15/2016   HGBA1C 6.1 (H) 12/20/2015   HGBA1C 6.3 (H) 05/04/2014    Assessment/Plan  1. Type 2 diabetes mellitus without complication, without long-term current use of insulin (HCC) - Hemoglobin J0K - Basic metabolic panel  2. Anemia, unspecified type - CBC with Differential/Platelet  3. Hyperlipidemia, unspecified hyperlipidemia type - Lipid panel  4. Coronary artery disease involving native coronary artery of native heart without angina pectoris stable  5. Cough observe  6. Edema, unspecified type observe

## 2016-09-08 DIAGNOSIS — I5020 Unspecified systolic (congestive) heart failure: Secondary | ICD-10-CM | POA: Diagnosis not present

## 2016-10-09 DIAGNOSIS — I5020 Unspecified systolic (congestive) heart failure: Secondary | ICD-10-CM | POA: Diagnosis not present

## 2016-10-28 ENCOUNTER — Encounter: Payer: Self-pay | Admitting: *Deleted

## 2016-10-28 DIAGNOSIS — H35033 Hypertensive retinopathy, bilateral: Secondary | ICD-10-CM | POA: Diagnosis not present

## 2016-10-28 DIAGNOSIS — H401112 Primary open-angle glaucoma, right eye, moderate stage: Secondary | ICD-10-CM | POA: Diagnosis not present

## 2016-10-28 DIAGNOSIS — H401123 Primary open-angle glaucoma, left eye, severe stage: Secondary | ICD-10-CM | POA: Diagnosis not present

## 2016-10-28 DIAGNOSIS — Z961 Presence of intraocular lens: Secondary | ICD-10-CM | POA: Diagnosis not present

## 2016-10-28 DIAGNOSIS — H353130 Nonexudative age-related macular degeneration, bilateral, stage unspecified: Secondary | ICD-10-CM | POA: Diagnosis not present

## 2016-10-28 LAB — HM DIABETES EYE EXAM

## 2016-11-08 DIAGNOSIS — I5020 Unspecified systolic (congestive) heart failure: Secondary | ICD-10-CM | POA: Diagnosis not present

## 2016-11-11 ENCOUNTER — Encounter: Payer: Self-pay | Admitting: Internal Medicine

## 2016-11-11 DIAGNOSIS — I351 Nonrheumatic aortic (valve) insufficiency: Secondary | ICD-10-CM | POA: Diagnosis not present

## 2016-11-17 DIAGNOSIS — I351 Nonrheumatic aortic (valve) insufficiency: Secondary | ICD-10-CM | POA: Diagnosis not present

## 2016-11-17 DIAGNOSIS — I129 Hypertensive chronic kidney disease with stage 1 through stage 4 chronic kidney disease, or unspecified chronic kidney disease: Secondary | ICD-10-CM | POA: Diagnosis not present

## 2016-11-17 DIAGNOSIS — E7800 Pure hypercholesterolemia, unspecified: Secondary | ICD-10-CM | POA: Diagnosis not present

## 2016-11-17 DIAGNOSIS — I2510 Atherosclerotic heart disease of native coronary artery without angina pectoris: Secondary | ICD-10-CM | POA: Diagnosis not present

## 2016-12-01 ENCOUNTER — Ambulatory Visit (INDEPENDENT_AMBULATORY_CARE_PROVIDER_SITE_OTHER): Payer: Medicare HMO

## 2016-12-01 VITALS — BP 150/60 | HR 67 | Temp 98.4°F | Ht 69.0 in | Wt 180.0 lb

## 2016-12-01 DIAGNOSIS — Z0000 Encounter for general adult medical examination without abnormal findings: Secondary | ICD-10-CM | POA: Diagnosis not present

## 2016-12-01 DIAGNOSIS — I10 Essential (primary) hypertension: Secondary | ICD-10-CM | POA: Diagnosis not present

## 2016-12-01 NOTE — Progress Notes (Signed)
Subjective:   John Mason is a 83 y.o. male who presents for an Initial Medicare Annual Wellness Visit.  Review of Systems   Cardiac Risk Factors include: advanced age (>28men, >74 women);family history of premature cardiovascular disease;hypertension;male gender;smoking/ tobacco exposure    Objective:    Today's Vitals   12/01/16 0916  BP: (!) 150/60  Pulse: 67  Temp: 98.4 F (36.9 C)  TempSrc: Oral  SpO2: 96%  Weight: 180 lb (81.6 kg)  Height: 5\' 9"  (1.753 m)  PainSc: 0-No pain   Body mass index is 26.58 kg/m.  Current Medications (verified) Outpatient Encounter Prescriptions as of 12/01/2016  Medication Sig  . aspirin 81 MG chewable tablet Chew 81 mg by mouth daily.  Marland Kitchen atorvastatin (LIPITOR) 10 MG tablet Take 10 mg by mouth daily.  . clopidogrel (PLAVIX) 75 MG tablet Take 75 mg by mouth daily.  . finasteride (PROSCAR) 5 MG tablet TAKE 1 TABLET BY MOUTH DAILY FOR PROSTATE  . hydrALAZINE (APRESOLINE) 25 MG tablet Take 1 tablet (25 mg total) by mouth every 8 (eight) hours.  Marland Kitchen latanoprost (XALATAN) 0.005 % ophthalmic solution Place 1 drop into both eyes at bedtime.   Marland Kitchen losartan (COZAAR) 50 MG tablet Take 50 mg by mouth daily.  Marland Kitchen NIFEdipine (PROCARDIA XL/ADALAT-CC) 60 MG 24 hr tablet Take 60 mg by mouth daily.  . nitroGLYCERIN (NITROSTAT) 0.4 MG SL tablet Place 1 tablet (0.4 mg total) under the tongue every 5 (five) minutes as needed for chest pain.  Marland Kitchen timolol (TIMOPTIC) 0.5 % ophthalmic solution Place 1 drop into both eyes daily.   Marland Kitchen triamcinolone cream (KENALOG) 0.1 % Apply sparingly up to twice daily to scaley areas on hands  . [DISCONTINUED] prasugrel (EFFIENT) 10 MG TABS tablet Take 10 mg by mouth daily.   No facility-administered encounter medications on file as of 12/01/2016.     Allergies (verified) Patient has no known allergies.   History: Past Medical History:  Diagnosis Date  . Abnormality of gait   . Actinic keratosis   . Cognitive deficits, late  effect of cerebrovascular disease   . Contact dermatitis and other eczema due to other specified agent   . Dizziness and giddiness   . Elevated prostate specific antigen (PSA)   . Essential and other specified forms of tremor   . First degree atrioventricular block   . Gastric ulcer, unspecified as acute or chronic, without mention of hemorrhage, perforation, or obstruction   . Hypertrophy of prostate without urinary obstruction and other lower urinary tract symptoms (LUTS)   . Impotence of organic origin   . Insomnia, unspecified   . Obesity, unspecified   . Other abnormal blood chemistry   . Pain in joint, lower leg   . STEMI (ST elevation myocardial infarction) (HCC) 05/07/2014  . Unspecified essential hypertension   . Unspecified glaucoma(365.9)   . Unspecified vitamin D deficiency   . Urge incontinence   . Urinary frequency    Past Surgical History:  Procedure Laterality Date  . APPENDECTOMY  1930's?  Marland Kitchen CATARACT EXTRACTION W/ INTRAOCULAR LENS  IMPLANT, BILATERAL Bilateral   . CORONARY ANGIOPLASTY WITH STENT PLACEMENT  05/07/2014   "1"  . CYST EXCISION Left 1977   "knee"  . LEFT HEART CATHETERIZATION WITH CORONARY ANGIOGRAM N/A 05/07/2014   Procedure: LEFT HEART CATHETERIZATION WITH CORONARY ANGIOGRAM;  Surgeon: Pamella Pert, MD;  Location[facility] C[facility] LAB;  Service: Cardiovascular;  Laterality: N/A;  . MULTIPLE TOOTH EXTRACTIONS    . TRANSURETHRAL RESECTION  OF PROSTATE  1987   Family History  Problem Relation Age of Onset  . Stroke Mother   . Stroke Sister    Social History   Occupational History  . Not on file.   Social History Main Topics  . Smoking status: Former Smoker    Types: Pipe  . Smokeless tobacco: Current User    Types: Chew  . Alcohol use No  . Drug use: No  . Sexual activity: No   Tobacco Counseling Ready to quit: No Counseling given: No   Activities of Daily Living In your present state of health, do you have any difficulty performing the  following activities: 12/01/2016  Hearing? Y  Vision? Y  Difficulty concentrating or making decisions? Y  Walking or climbing stairs? N  Dressing or bathing? N  Doing errands, shopping? N  Preparing Food and eating ? N  Using the Toilet? N  In the past six months, have you accidently leaked urine? N  Do you have problems with loss of bowel control? N  Managing your Medications? Y  Managing your Finances? N  Housekeeping or managing your Housekeeping? Y  Some recent data might be hidden    Immunizations and Health Maintenance Immunization History  Administered Date(s) Administered  . Influenza,inj,Quad PF,36+ Mos 12/07/2013  . Influenza-Unspecified 07/31/2009, 10/08/2010, 09/06/2012  . Pneumococcal Polysaccharide-23 05/08/2014  . Tdap 02/03/2012   Health Maintenance Due  Topic Date Due  . FOOT EXAM  03/07/1939  . HEMOGLOBIN A1C  10/16/2016    Patient Care Team: Kimber RelicArthur G Green, MD as PCP - General (Internal Medicine) Mateo FlowKathryn Hecker, MD as Consulting Physician (Ophthalmology) Yates DecampJay Ganji, MD as Consulting Physician (Cardiology)  Indicate any recent Medical Services you may have received from other than [facility] providers in the past year (date may be approximate).    Assessment:   [facility].  Hearing/Vision screen  Hearing Screening   125Hz  250Hz  500Hz  1000Hz  2000Hz  3000Hz  4000Hz  6000Hz  8000Hz   Right ear:   100 100 100  0    Left ear:   0 100 0  0    Comments: Pt unsure of last hearing screen. Pt does not want to pursue hearing aids. Failed office hearing screen.   Vision Screening Comments: Pt has an eye exam scheduled with Dr. Thereasa ParkinKatherine Hector on 12/27/2016. Hx of glaucoma.   Dietary issues and exercise activities discussed: Current Exercise Habits: The patient does not participate in regular exercise at present, Exercise limited by: cardiac condition(s)  Goals    . <enter goal here>          Starting 12/01/2016, I will maintain my  current lifestyle.       Depression Screen PHQ 2/9 Scores 12/01/2016 08/26/2016 03/29/2013  PHQ - 2 Score 0 0 0    Fall Risk Fall Risk  12/01/2016 08/26/2016 04/15/2016 12/17/2015 11/14/2015  Falls in the past year? No No No No No    Cognitive Function: MMSE - Mini Mental State Exam 12/01/2016  Orientation to time 5  Orientation to Place 4  Registration 3  Attention/ Calculation 0  Attention/Calculation-comments Pt refused to spell the word WORLD backwards.  Recall 2  Language- name 2 objects 2  Language- repeat 1  Language- follow 3 step command 3  Language- read & follow direction 1  Write a sentence 0  Copy design 1  Total score 22        Screening Tests Health Maintenance  Topic Date Due  .  FOOT EXAM  03/07/1939  . HEMOGLOBIN A1C  10/16/2016  . INFLUENZA VACCINE  08/23/2017 (Originally 06/23/2016)  . ZOSTAVAX  12/16/2017 (Originally 03/08/1991)  . PNA vac Low Risk Adult (2 of 2 - PCV13) 12/16/2017 (Originally 05/09/2015)  . OPHTHALMOLOGY EXAM  10/28/2017  . TETANUS/TDAP  02/02/2022        Plan:    I have personally reviewed and addressed the Medicare Annual Wellness questionnaire and have noted the following in the patient's chart:  A. Medical and social history B. Use of alcohol, tobacco or illicit drugs  C. Current medications and supplements D. Functional ability and status E.  Nutritional status F.  Physical activity G. Advance directives H. List of other physicians I.  Hospitalizations, surgeries, and ER visits in previous 12 months J.  Vitals K. Screenings to include hearing, vision, cognitive, depression L. Referrals and appointments - none  In addition, I have reviewed and discussed with patient certain preventive protocols, quality metrics, and best practice recommendations. A written personalized care plan for preventive services as well as general preventive health recommendations were provided to patient.  See attached scanned questionnaire for  additional information.   Signed,   Nilda Calamity, LPN Health Advisor   I reviewed health advisors note, was available for consultation and agree with documentation and plan.  Janene Harvey. Biagio Borg  [facility] Adult [facility] [phone] 8 am - 5 pm) [phone] (after hours)

## 2016-12-01 NOTE — Progress Notes (Signed)
Quick Notes   Health Maintenance:  Pt due for foot exam and HBA1C.    Abnormal Screen: None; MMSE-22/30 Passed clock test; Pt refused to spell the word world backwards.     Patient Concerns:  None    Nurse Concerns:  None; pt shows some cognitive deficits. When asked a question, he would start talking about a something not pertaining to question.

## 2016-12-01 NOTE — Patient Instructions (Addendum)
Mr. John Mason , Thank you for taking time to come for your Medicare Wellness Visit. I appreciate your ongoing commitment to your health goals. Please review the following plan we discussed and let me know if I can assist you in the future.   These are the goals we discussed: Goals    . <enter goal here>          Starting 12/01/2016, I will maintain my current lifestyle.        This is a list of the screening recommended for you and due dates:  Health Maintenance  Topic Date Due  . Complete foot exam   03/07/1939  . Hemoglobin A1C  10/16/2016  . Flu Shot  08/23/2017*  . Shingles Vaccine  12/16/2017*  . Pneumonia vaccines (2 of 2 - PCV13) 12/16/2017*  . Eye exam for diabetics  10/28/2017  . Tetanus Vaccine  02/02/2022  *Topic was postponed. The date shown is not the original due date.  Preventive Care for Adults  A healthy lifestyle and preventive care can promote health and wellness. Preventive health guidelines for adults include the following key practices.  . A routine yearly physical is a good way to check with your health care provider about your health and preventive screening. It is a chance to share any concerns and updates on your health and to receive a thorough exam.  . Visit your dentist for a routine exam and preventive care every 6 months. Brush your teeth twice a day and floss once a day. Good oral hygiene prevents tooth decay and gum disease.  . The frequency of eye exams is based on your age, health, family medical history, use  of contact lenses, and other factors. Follow your health care provider's ecommendations for frequency of eye exams.  . Eat a healthy diet. Foods like vegetables, fruits, whole grains, low-fat dairy products, and lean protein foods contain the nutrients you need without too many calories. Decrease your intake of foods high in solid fats, added sugars, and salt. Eat the right amount of calories for you. Get information about a proper diet from your  health care provider, if necessary.  . Regular physical exercise is one of the most important things you can do for your health. Most adults should get at least 150 minutes of moderate-intensity exercise (any activity that increases your heart rate and causes you to sweat) each week. In addition, most adults need muscle-strengthening exercises on 2 or more days a week.  Silver Sneakers may be a benefit available to you. To determine eligibility, you may visit the website: www.silversneakers.com or contact program at [phone] Mon-Fri between 8AM-8PM.   . Maintain a healthy weight. The body mass index (BMI) is a screening tool to identify possible weight problems. It provides an estimate of body fat based on height and weight. Your health care provider can find your BMI and can help you achieve or maintain a healthy weight.   For adults 20 years and older: ? A BMI below 18.5 is considered underweight. ? A BMI of 18.5 to 24.9 is normal. ? A BMI of 25 to 29.9 is considered overweight. ? A BMI of 30 and above is considered obese.   . Maintain normal blood lipids and cholesterol levels by exercising and minimizing your intake of saturated fat. Eat a balanced diet with plenty of fruit and vegetables. Blood tests for lipids and cholesterol should begin at age 81 and be repeated every 5 years. If your lipid or cholesterol levels  are high, you are over 50, or you are at high risk for heart disease, you may need your cholesterol levels checked more frequently. Ongoing high lipid and cholesterol levels should be treated with medicines if diet and exercise are not working.  . If you smoke, find out from your health care provider how to quit. If you do not use tobacco, please do not start.  . If you choose to drink alcohol, please do not consume more than 2 drinks per day. One drink is considered to be 12 ounces (355 mL) of beer, 5 ounces (148 mL) of wine, or 1.5 ounces (44 mL) of liquor.  . If you are  75-4 years old, ask your health care provider if you should take aspirin to prevent strokes.  . Use sunscreen. Apply sunscreen liberally and repeatedly throughout the day. You should seek shade when your shadow is shorter than you. Protect yourself by wearing long sleeves, pants, a wide-brimmed hat, and sunglasses year round, whenever you are outdoors.  . Once a month, do a whole body skin exam, using a mirror to look at the skin on your back. Tell your health care provider of new moles, moles that have irregular borders, moles that are larger than a pencil eraser, or moles that have changed in shape or color.

## 2016-12-09 DIAGNOSIS — I5020 Unspecified systolic (congestive) heart failure: Secondary | ICD-10-CM | POA: Diagnosis not present

## 2016-12-11 DIAGNOSIS — I10 Essential (primary) hypertension: Secondary | ICD-10-CM | POA: Diagnosis not present

## 2016-12-29 ENCOUNTER — Ambulatory Visit: Payer: Medicare HMO | Admitting: Internal Medicine

## 2016-12-30 DIAGNOSIS — H409 Unspecified glaucoma: Secondary | ICD-10-CM | POA: Diagnosis not present

## 2016-12-30 DIAGNOSIS — E7800 Pure hypercholesterolemia, unspecified: Secondary | ICD-10-CM | POA: Diagnosis not present

## 2016-12-30 DIAGNOSIS — H47292 Other optic atrophy, left eye: Secondary | ICD-10-CM | POA: Diagnosis not present

## 2016-12-30 DIAGNOSIS — I110 Hypertensive heart disease with heart failure: Secondary | ICD-10-CM | POA: Diagnosis not present

## 2016-12-30 DIAGNOSIS — Z79899 Other long term (current) drug therapy: Secondary | ICD-10-CM | POA: Diagnosis not present

## 2016-12-30 DIAGNOSIS — I5030 Unspecified diastolic (congestive) heart failure: Secondary | ICD-10-CM | POA: Diagnosis not present

## 2016-12-30 DIAGNOSIS — R69 Illness, unspecified: Secondary | ICD-10-CM | POA: Diagnosis not present

## 2016-12-30 DIAGNOSIS — Z7902 Long term (current) use of antithrombotics/antiplatelets: Secondary | ICD-10-CM | POA: Diagnosis not present

## 2016-12-30 DIAGNOSIS — R32 Unspecified urinary incontinence: Secondary | ICD-10-CM | POA: Diagnosis not present

## 2016-12-30 DIAGNOSIS — H547 Unspecified visual loss: Secondary | ICD-10-CM | POA: Diagnosis not present

## 2016-12-30 DIAGNOSIS — H9110 Presbycusis, unspecified ear: Secondary | ICD-10-CM | POA: Diagnosis not present

## 2016-12-30 DIAGNOSIS — H401123 Primary open-angle glaucoma, left eye, severe stage: Secondary | ICD-10-CM | POA: Diagnosis not present

## 2016-12-30 DIAGNOSIS — H01003 Unspecified blepharitis right eye, unspecified eyelid: Secondary | ICD-10-CM | POA: Diagnosis not present

## 2016-12-30 DIAGNOSIS — Z7982 Long term (current) use of aspirin: Secondary | ICD-10-CM | POA: Diagnosis not present

## 2016-12-30 DIAGNOSIS — R42 Dizziness and giddiness: Secondary | ICD-10-CM | POA: Diagnosis not present

## 2016-12-30 DIAGNOSIS — L853 Xerosis cutis: Secondary | ICD-10-CM | POA: Diagnosis not present

## 2016-12-30 DIAGNOSIS — Z9181 History of falling: Secondary | ICD-10-CM | POA: Diagnosis not present

## 2016-12-30 DIAGNOSIS — H401112 Primary open-angle glaucoma, right eye, moderate stage: Secondary | ICD-10-CM | POA: Diagnosis not present

## 2016-12-30 DIAGNOSIS — N400 Enlarged prostate without lower urinary tract symptoms: Secondary | ICD-10-CM | POA: Diagnosis not present

## 2016-12-30 DIAGNOSIS — R1912 Hyperactive bowel sounds: Secondary | ICD-10-CM | POA: Diagnosis not present

## 2016-12-30 DIAGNOSIS — Z0000 Encounter for general adult medical examination without abnormal findings: Secondary | ICD-10-CM | POA: Diagnosis not present

## 2017-01-09 DIAGNOSIS — I5020 Unspecified systolic (congestive) heart failure: Secondary | ICD-10-CM | POA: Diagnosis not present

## 2017-02-05 ENCOUNTER — Other Ambulatory Visit: Payer: Self-pay | Admitting: Internal Medicine

## 2017-02-06 DIAGNOSIS — I5020 Unspecified systolic (congestive) heart failure: Secondary | ICD-10-CM | POA: Diagnosis not present

## 2017-03-09 DIAGNOSIS — I5020 Unspecified systolic (congestive) heart failure: Secondary | ICD-10-CM | POA: Diagnosis not present

## 2017-03-10 DIAGNOSIS — H47292 Other optic atrophy, left eye: Secondary | ICD-10-CM | POA: Diagnosis not present

## 2017-03-10 DIAGNOSIS — H01003 Unspecified blepharitis right eye, unspecified eyelid: Secondary | ICD-10-CM | POA: Diagnosis not present

## 2017-03-10 DIAGNOSIS — H401112 Primary open-angle glaucoma, right eye, moderate stage: Secondary | ICD-10-CM | POA: Diagnosis not present

## 2017-03-10 DIAGNOSIS — H401123 Primary open-angle glaucoma, left eye, severe stage: Secondary | ICD-10-CM | POA: Diagnosis not present

## 2017-03-16 DIAGNOSIS — I2510 Atherosclerotic heart disease of native coronary artery without angina pectoris: Secondary | ICD-10-CM | POA: Diagnosis not present

## 2017-03-16 DIAGNOSIS — I129 Hypertensive chronic kidney disease with stage 1 through stage 4 chronic kidney disease, or unspecified chronic kidney disease: Secondary | ICD-10-CM | POA: Diagnosis not present

## 2017-03-16 DIAGNOSIS — I351 Nonrheumatic aortic (valve) insufficiency: Secondary | ICD-10-CM | POA: Diagnosis not present

## 2017-03-16 DIAGNOSIS — E7800 Pure hypercholesterolemia, unspecified: Secondary | ICD-10-CM | POA: Diagnosis not present

## 2017-04-08 DIAGNOSIS — I5020 Unspecified systolic (congestive) heart failure: Secondary | ICD-10-CM | POA: Diagnosis not present

## 2017-05-09 DIAGNOSIS — I5020 Unspecified systolic (congestive) heart failure: Secondary | ICD-10-CM | POA: Diagnosis not present

## 2017-05-10 DIAGNOSIS — H40051 Ocular hypertension, right eye: Secondary | ICD-10-CM | POA: Diagnosis not present

## 2017-05-10 DIAGNOSIS — H401112 Primary open-angle glaucoma, right eye, moderate stage: Secondary | ICD-10-CM | POA: Diagnosis not present

## 2017-05-10 DIAGNOSIS — H01003 Unspecified blepharitis right eye, unspecified eyelid: Secondary | ICD-10-CM | POA: Diagnosis not present

## 2017-05-10 DIAGNOSIS — H401123 Primary open-angle glaucoma, left eye, severe stage: Secondary | ICD-10-CM | POA: Diagnosis not present

## 2017-05-11 ENCOUNTER — Encounter: Payer: Self-pay | Admitting: Nurse Practitioner

## 2017-05-11 ENCOUNTER — Ambulatory Visit (INDEPENDENT_AMBULATORY_CARE_PROVIDER_SITE_OTHER): Payer: Medicare HMO | Admitting: Nurse Practitioner

## 2017-05-11 VITALS — BP 160/62 | HR 60 | Temp 97.5°F | Resp 17 | Ht 69.0 in | Wt 177.2 lb

## 2017-05-11 DIAGNOSIS — R112 Nausea with vomiting, unspecified: Secondary | ICD-10-CM | POA: Diagnosis not present

## 2017-05-11 LAB — CBC WITH DIFFERENTIAL/PLATELET
BASOS ABS: 64 {cells}/uL (ref 0–200)
BASOS PCT: 1 %
EOS PCT: 4 %
Eosinophils Absolute: 256 cells/uL (ref 15–500)
HCT: 39.4 % (ref 38.5–50.0)
Hemoglobin: 13.1 g/dL — ABNORMAL LOW (ref 13.2–17.1)
Lymphocytes Relative: 18 %
Lymphs Abs: 1152 cells/uL (ref 850–3900)
MCH: 28.9 pg (ref 27.0–33.0)
MCHC: 33.2 g/dL (ref 32.0–36.0)
MCV: 87 fL (ref 80.0–100.0)
MONOS PCT: 11 %
MPV: 9.2 fL (ref 7.5–12.5)
Monocytes Absolute: 704 cells/uL (ref 200–950)
NEUTROS ABS: 4224 {cells}/uL (ref 1500–7800)
Neutrophils Relative %: 66 %
PLATELETS: 146 10*3/uL (ref 140–400)
RBC: 4.53 MIL/uL (ref 4.20–5.80)
RDW: 14.7 % (ref 11.0–15.0)
WBC: 6.4 10*3/uL (ref 3.8–10.8)

## 2017-05-11 MED ORDER — ONDANSETRON HCL 4 MG PO TABS: 4.0000 mg | ORAL_TABLET | Freq: Three times a day (TID) | ORAL | 0 refills | Status: DC | PRN

## 2017-05-11 NOTE — Progress Notes (Signed)
Careteam: Patient Care Team: Estill Dooms, MD as PCP - General (Internal Medicine) Monna Fam, MD as Consulting Physician (Ophthalmology) Adrian Prows, MD as Consulting Physician (Cardiology)  Advanced Directive information Does Patient Have a Medical Advance Directive?: Yes, Type of Advance Directive: Healthcare Power of Attorney  No Known Allergies  Chief Complaint  Patient presents with  . Acute Visit    Pt is being seen due to N&V all night with dizziness when he gets up.      HPI: Patient is a 83 y.o. male seen in the office today due to Nausea and vomiting.  Had a burger from [employer] then when he got home he vomited a lot. Had loose stool yesterday morning.  Took his medications this morning and did not vomit that up. Feeling fatigued today. No further vomiting.  Ate jello, broth, banana Drinking fluids and been able to keep those down.  No abdominal pain.  No further diarrhea.  No fevers.  No new medications. Has been taking hydralazine 50 mg 2 months ago.   Review of Systems:  Review of Systems  Constitutional: Positive for malaise/fatigue. Negative for chills, fever and weight loss.  HENT: Negative for tinnitus.   Respiratory: Negative for cough, sputum production and shortness of breath.   Cardiovascular: Negative for chest pain, palpitations and leg swelling.  Gastrointestinal: Positive for diarrhea, nausea and vomiting. Negative for abdominal pain, constipation and heartburn.  Genitourinary: Negative for dysuria, frequency and urgency.  Musculoskeletal: Negative for back pain, falls, joint pain and myalgias.  Skin: Negative.   Neurological: Positive for dizziness. Negative for headaches.  Psychiatric/Behavioral: Negative for depression and memory loss. The patient does not have insomnia.      Past Medical History:  Diagnosis Date  . Abnormality of gait   . Actinic keratosis   . Cognitive deficits, late effect of cerebrovascular disease   .  Contact dermatitis and other eczema due to other specified agent   . Dizziness and giddiness   . Elevated prostate specific antigen (PSA)   . Essential and other specified forms of tremor   . First degree atrioventricular block   . Gastric ulcer, unspecified as acute or chronic, without mention of hemorrhage, perforation, or obstruction   . Hypertrophy of prostate without urinary obstruction and other lower urinary tract symptoms (LUTS)   . Impotence of organic origin   . Insomnia, unspecified   . Obesity, unspecified   . Other abnormal blood chemistry   . Pain in joint, lower leg   . STEMI (ST elevation myocardial infarction) ([place]) 05/07/2014  . Unspecified essential hypertension   . Unspecified glaucoma(365.9)   . Unspecified vitamin D deficiency   . Urge incontinence   . Urinary frequency    Past Surgical History:  Procedure Laterality Date  . APPENDECTOMY  1930's?  Marland Kitchen CATARACT EXTRACTION W/ INTRAOCULAR LENS  IMPLANT, BILATERAL Bilateral   . CORONARY ANGIOPLASTY WITH STENT PLACEMENT  05/07/2014   "1"  . CYST EXCISION Left 1977   "knee"  . LEFT HEART CATHETERIZATION WITH CORONARY ANGIOGRAM N/A 05/07/2014   Procedure: LEFT HEART CATHETERIZATION WITH CORONARY ANGIOGRAM;  Surgeon: Laverda Page, MD;  Location: [facility] CATH LAB;  Service: Cardiovascular;  Laterality: N/A;  . MULTIPLE TOOTH EXTRACTIONS    . TRANSURETHRAL RESECTION OF PROSTATE  1987   Social History:   reports that he has quit smoking. His smoking use included Pipe. His smokeless tobacco use includes Chew. He reports that he does not drink alcohol or  use drugs.  Family History  Problem Relation Age of Onset  . Stroke Mother   . Stroke Sister     Medications: Patient's Medications  New Prescriptions   No medications on file  Previous Medications   ASPIRIN 81 MG CHEWABLE TABLET    Chew 81 mg by mouth daily.   ATORVASTATIN (LIPITOR) 10 MG TABLET    Take 10 mg by mouth daily.   CLOPIDOGREL (PLAVIX) 75 MG TABLET     Take 75 mg by mouth daily.   FINASTERIDE (PROSCAR) 5 MG TABLET    TAKE 1 TABLET BY MOUTH DAILY FOR PROSTATE   HYDRALAZINE (APRESOLINE) 50 MG TABLET    Take 50 mg by mouth every 8 (eight) hours.   LATANOPROST (XALATAN) 0.005 % OPHTHALMIC SOLUTION    Place 1 drop into both eyes at bedtime.    LOSARTAN (COZAAR) 50 MG TABLET    Take 50 mg by mouth daily.   NIFEDIPINE (PROCARDIA XL/ADALAT-CC) 60 MG 24 HR TABLET    Take 60 mg by mouth daily.   NITROGLYCERIN (NITROSTAT) 0.4 MG SL TABLET    Place 1 tablet (0.4 mg total) under the tongue every 5 (five) minutes as needed for chest pain.   TIMOLOL (TIMOPTIC) 0.5 % OPHTHALMIC SOLUTION    Place 1 drop into both eyes daily.    TRIAMCINOLONE CREAM (KENALOG) 0.1 %    Apply sparingly up to twice daily to scaley areas on hands  Modified Medications   No medications on file  Discontinued Medications   HYDRALAZINE (APRESOLINE) 25 MG TABLET    Take 1 tablet (25 mg total) by mouth every 8 (eight) hours.     Physical Exam:  Vitals:   05/11/17 1600  BP: (!) 160/62  Pulse: 60  Resp: 17  Temp: 97.5 F (36.4 C)  TempSrc: Oral  SpO2: 96%  Weight: 177 lb 3.2 oz (80.4 kg)  Height: _0  (1.753 m)   Body mass index is 26.17 kg/m.  Physical Exam  Constitutional: He is oriented to person, place, and time. He appears well-developed and well-nourished. No distress.  HENT:  Head: Normocephalic and atraumatic.  Right Ear: External ear normal.  Left Ear: External ear normal.  Nose: Nose normal.  Mouth/Throat: Oropharynx is clear and moist. No oropharyngeal exudate.  Eyes: Conjunctivae and EOM are normal. Pupils are equal, round, and reactive to light.  Neck: Normal range of motion. Neck supple. No JVD present. No tracheal deviation present. No thyromegaly present.  Cardiovascular: Normal rate, regular rhythm and normal heart sounds.  Exam reveals no gallop and no friction rub.   No murmur heard. Pulmonary/Chest: Effort normal and breath sounds normal. No  respiratory distress. He has no wheezes. He has no rales. He exhibits no tenderness.  Abdominal: Soft. Bowel sounds are normal. He exhibits no distension and no mass. There is no tenderness.  Musculoskeletal: Normal range of motion. He exhibits edema. He exhibits no tenderness.  Lymphadenopathy:    He has no cervical adenopathy.  Neurological: He is alert and oriented to person, place, and time. He has normal reflexes. No cranial nerve deficit. Coordination normal.  Skin: Skin is warm and dry. No rash noted. He is not diaphoretic. No erythema. No pallor.  Multiple seborrheic keratoses  Psychiatric: He has a normal mood and affect. His behavior is normal. Judgment and thought content normal.    Labs reviewed: Basic Metabolic Panel:  Recent Labs  08/26/16 1340  NA 141  K 4.5  CL 107  CO2  25  GLUCOSE 109*  BUN 17  CREATININE 1.07  CALCIUM 9.1   Liver Function Tests: No results for input(s): AST, ALT, ALKPHOS, BILITOT, PROT, ALBUMIN in the last 8760 hours. No results for input(s): LIPASE, AMYLASE in the last 8760 hours. No results for input(s): AMMONIA in the last 8760 hours. CBC:  Recent Labs  08/26/16 1340  WBC 7.1  NEUTROABS 4,828  HGB 12.4*  HCT 38.0*  MCV 88.0  PLT 141   Lipid Panel:  Recent Labs  08/26/16 1340  CHOL 103*  HDL 50  LDLCALC 32  TRIG 103  CHOLHDL 2.1   TSH: No results for input(s): TSH in the last 8760 hours. A1C: Lab Results  Component Value Date   HGBA1C 6.4 (H) 04/15/2016     Assessment/Plan 1. Nausea and vomiting, intractability of vomiting not specified, unspecified vomiting type Most likely due to gastroenteritis. Supportive care at this time.  - ondansetron (ZOFRAN) 4 MG tablet; Take 1 tablet (4 mg total) by mouth every 8 (eight) hours as needed for nausea or vomiting.  Dispense: 15 tablet; Refill: 0 - CBC with Differential/Platelets - CMP with eGFR - Amylase - Lipase -bland diet advance as tolerates.  -notify/seek medical  attention if symptoms worsen or fail to improve.    Carlos American. Harle Battiest  [facility] [phone] 8 am - 5 pm) [phone] (after hours)

## 2017-05-11 NOTE — Patient Instructions (Addendum)
Bland diet, advance as tolerates Drink more water and electrolyte drinks To use zofran as needed for nausea     Norovirus Infection A norovirus infection is caused by exposure to a virus in a group of similar viruses (noroviruses). This type of infection causes inflammation in your stomach and intestines (gastroenteritis). Norovirus is the most common cause of gastroenteritis. It also causes food poisoning. Anyone can get a norovirus infection. It spreads very easily (contagious). You can get it from contaminated food, water, surfaces, or other people. Norovirus is found in the stool or vomit of infected people. You can spread the infection as soon as you feel sick until 2 weeks after you recover. Symptoms usually begin within 2 days after you become infected. Most norovirus symptoms affect the digestive system. What are the causes? Norovirus infection is caused by contact with norovirus. You can catch norovirus if you:  Eat or drink something contaminated with norovirus.  Touch surfaces or objects contaminated with norovirus and then put your hand in your mouth.  Have direct contact with an infected person who has symptoms.  Share food, drink, or utensils with someone with who is sick with norovirus.  What are the signs or symptoms? Symptoms of norovirus may include:  Nausea.  Vomiting.  Diarrhea.  Stomach cramps.  Fever.  Chills.  Headache.  Muscle aches.  Tiredness.  How is this diagnosed? Your health care provider may suspect norovirus based on your symptoms and physical exam. Your health care provider may also test a sample of your stool or vomit for the virus. How is this treated? There is no specific treatment for norovirus. Most people get better without treatment in about 2 days. Follow these instructions at home:  Replace lost fluids by drinking plenty of water or rehydration fluids containing important minerals called electrolytes. This prevents  dehydration. Drink enough fluid to keep your urine clear or pale yellow.  Do not prepare food for others while you are infected. Wait at least 3 days after recovering from the illness to do that. How is this prevented?  Wash your hands often, especially after using the toilet or changing a diaper.  Wash fruits and vegetables thoroughly before preparing or serving them.  Throw out any food that a sick person may have touched.  Disinfect contaminated surfaces immediately after someone in the household has been sick. Use a bleach-based household cleaner.  Immediately remove and wash soiled clothes or sheets. Contact a health care provider if:  Your vomiting, diarrhea, and stomach pain is getting worse.  Your symptoms of norovirus do not go away after 2-3 days. Get help right away if: You develop symptoms of dehydration that do not improve with fluid replacement. This may include:  Excessive sleepiness.  Lack of tears.  Dry mouth.  Dizziness when standing.  Weak pulse.  This information is not intended to replace advice given to you by your health care provider. Make sure you discuss any questions you have with your health care provider. Document Released: 01/30/2003 Document Revised: 04/16/2016 Document Reviewed: 04/19/2014 Elsevier Interactive Patient Education  2017 [employer]

## 2017-05-12 LAB — COMPLETE METABOLIC PANEL WITH GFR
ALT: 9 U/L (ref 9–46)
AST: 14 U/L (ref 10–35)
Albumin: 4.3 g/dL (ref 3.6–5.1)
Alkaline Phosphatase: 75 U/L (ref 40–115)
BILIRUBIN TOTAL: 0.6 mg/dL (ref 0.2–1.2)
BUN: 19 mg/dL (ref 7–25)
CO2: 22 mmol/L (ref 20–31)
Calcium: 9.3 mg/dL (ref 8.6–10.3)
Chloride: 104 mmol/L (ref 98–110)
Creat: 1.05 mg/dL (ref 0.70–1.11)
GFR, EST NON AFRICAN AMERICAN: 64 mL/min (ref >=60)
GFR, Est African American: 74 mL/min (ref >=60)
GLUCOSE: 125 mg/dL — AB (ref 65–99)
Potassium: 4.3 mmol/L (ref 3.5–5.3)
SODIUM: 137 mmol/L (ref 135–146)
TOTAL PROTEIN: 6.6 g/dL (ref 6.1–8.1)

## 2017-05-12 LAB — LIPASE: Lipase: 27 U/L (ref 7–60)

## 2017-05-12 LAB — AMYLASE: Amylase: 32 U/L (ref 21–101)

## 2017-05-13 ENCOUNTER — Telehealth: Payer: Self-pay | Admitting: *Deleted

## 2017-05-13 ENCOUNTER — Ambulatory Visit (INDEPENDENT_AMBULATORY_CARE_PROVIDER_SITE_OTHER): Payer: Medicare HMO | Admitting: Nurse Practitioner

## 2017-05-13 ENCOUNTER — Encounter: Payer: Self-pay | Admitting: Nurse Practitioner

## 2017-05-13 VITALS — BP 166/60 | HR 74 | Temp 97.8°F | Resp 17

## 2017-05-13 DIAGNOSIS — H6122 Impacted cerumen, left ear: Secondary | ICD-10-CM | POA: Diagnosis not present

## 2017-05-13 DIAGNOSIS — R42 Dizziness and giddiness: Secondary | ICD-10-CM

## 2017-05-13 LAB — POCT URINALYSIS DIPSTICK
Bilirubin, UA: NEGATIVE
Blood, UA: NEGATIVE
GLUCOSE UA: NEGATIVE
KETONES UA: NEGATIVE
LEUKOCYTES UA: NEGATIVE
Nitrite, UA: NEGATIVE
PROTEIN UA: NEGATIVE
Spec Grav, UA: 1.02 (ref 1.010–1.025)
Urobilinogen, UA: 0.2 E.U./dL
pH, UA: 5 (ref 5.0–8.0)

## 2017-05-13 LAB — CBC WITH DIFFERENTIAL/PLATELET
BASOS ABS: 75 {cells}/uL (ref 0–200)
Basophils Relative: 1 %
EOS ABS: 150 {cells}/uL (ref 15–500)
Eosinophils Relative: 2 %
HCT: 39.8 % (ref 38.5–50.0)
HEMOGLOBIN: 13.1 g/dL — AB (ref 13.2–17.1)
LYMPHS ABS: 1425 {cells}/uL (ref 850–3900)
Lymphocytes Relative: 19 %
MCH: 28.9 pg (ref 27.0–33.0)
MCHC: 32.9 g/dL (ref 32.0–36.0)
MCV: 87.7 fL (ref 80.0–100.0)
MONO ABS: 825 {cells}/uL (ref 200–950)
MPV: 9.1 fL (ref 7.5–12.5)
Monocytes Relative: 11 %
NEUTROS PCT: 67 %
Neutro Abs: 5025 cells/uL (ref 1500–7800)
Platelets: 139 10*3/uL — ABNORMAL LOW (ref 140–400)
RBC: 4.54 MIL/uL (ref 4.20–5.80)
RDW: 14.6 % (ref 11.0–15.0)
WBC: 7.5 10*3/uL (ref 3.8–10.8)

## 2017-05-13 LAB — TSH: TSH: 1.36 mIU/L (ref 0.40–4.50)

## 2017-05-13 MED ORDER — MECLIZINE HCL 25 MG PO TABS: 12.5000 mg | ORAL_TABLET | Freq: Three times a day (TID) | ORAL | 1 refills | Status: DC | PRN

## 2017-05-13 NOTE — Telephone Encounter (Signed)
Spoke with Daughter, scheduled an appointment for today.

## 2017-05-13 NOTE — Progress Notes (Signed)
Careteam: Patient Care Team: Estill Dooms, MD as PCP - General (Internal Medicine) Monna Fam, MD as Consulting Physician (Ophthalmology) Adrian Prows, MD as Consulting Physician (Cardiology)  Advanced Directive information Does Patient Have a Medical Advance Directive?: Yes, Type of Advance Directive: Healthcare Power of Attorney  No Known Allergies  Chief Complaint  Patient presents with  . Acute Visit    Pt being seen due to continued nausea and dizziness; possible UTI     HPI: Patient is a 83 y.o. male seen in the office today due to dizziness. Pt was seen in office 2 days ago due to Nausea and vomiting. Since then he has gotten really dizzy. Hard to stand up because he gets very dizzy. Another episode of vomiting. Unsteady gait. Reports everything "takes off" when he gets up. Room is spinning when he moves his head Thinks he may be nauseous due to being dizzy. No ringing in his ears.  Having blurred vision which is worse than 2 days ago. Reports he can not read things on the door that he was able to read 2 days ago.  Not getting out of his chair at home.  No fevers. No confusion  Sleeping a lot at home which is not new No weakness just unstable on feet.   Review of Systems:  Review of Systems  Constitutional: Positive for malaise/fatigue. Negative for chills, fever and weight loss.  HENT: Negative for congestion, ear discharge, nosebleeds and tinnitus.   Respiratory: Negative for cough, sputum production and shortness of breath.   Cardiovascular: Negative for chest pain, palpitations and leg swelling.  Gastrointestinal: Positive for nausea. Negative for abdominal pain, constipation, diarrhea, heartburn and vomiting.  Genitourinary: Negative for dysuria, frequency and urgency.  Musculoskeletal: Negative for back pain, falls, joint pain and myalgias.  Skin: Negative.   Neurological: Positive for dizziness (much worse). Negative for tingling, tremors, sensory  change, speech change, focal weakness, seizures, loss of consciousness and headaches.  Psychiatric/Behavioral: Negative for depression and memory loss. The patient has insomnia (but sleeps during the day).     Past Medical History:  Diagnosis Date  . Abnormality of gait   . Actinic keratosis   . Cognitive deficits, late effect of cerebrovascular disease   . Contact dermatitis and other eczema due to other specified agent   . Dizziness and giddiness   . Elevated prostate specific antigen (PSA)   . Essential and other specified forms of tremor   . First degree atrioventricular block   . Gastric ulcer, unspecified as acute or chronic, without mention of hemorrhage, perforation, or obstruction   . Hypertrophy of prostate without urinary obstruction and other lower urinary tract symptoms (LUTS)   . Impotence of organic origin   . Insomnia, unspecified   . Obesity, unspecified   . Other abnormal blood chemistry   . Pain in joint, lower leg   . STEMI (ST elevation myocardial infarction) ([place]) 05/07/2014  . Unspecified essential hypertension   . Unspecified glaucoma(365.9)   . Unspecified vitamin D deficiency   . Urge incontinence   . Urinary frequency    Past Surgical History:  Procedure Laterality Date  . APPENDECTOMY  1930's?  Mar[facility] Kitchen CATARACT EXTRACTION W/ INTRAOCULAR LENS  IMPLANT, BILATERAL Bilateral   . CORONARY ANGIOPLASTY WITH STENT PLACEMENT  05/07/2014   "1"  . CYST EXCISION Left 1977   "knee"  . LEFT HEART CATHETERIZATION WITH CORONARY ANGIOGRAM N/A 05/07/2014   Procedure: LEFT HEART CATHETERIZATION WITH CORONARY ANGIOGRAM;  Surgeon: Cammy Brochure  Carlynn Herald, MD;  Location: [facility];  Service: Cardiovascular;  Laterality: N/A;  . MULTIPLE TOOTH EXTRACTIONS    . TRANSURETHRAL RESECTION OF PROSTATE  1987   Social History:   reports that he has quit smoking. His smoking use included Pipe. His smokeless tobacco use includes Chew. He reports that he does not drink alcohol or use  drugs.  Family History  Problem Relation Age of Onset  . Stroke Mother   . Stroke Sister     Medications: Patient's Medications  New Prescriptions   No medications on file  Previous Medications   ASPIRIN 81 MG CHEWABLE TABLET    Chew 81 mg by mouth daily.   ATORVASTATIN (LIPITOR) 10 MG TABLET    Take 10 mg by mouth daily.   CLOPIDOGREL (PLAVIX) 75 MG TABLET    Take 75 mg by mouth daily.   FINASTERIDE (PROSCAR) 5 MG TABLET    TAKE 1 TABLET BY MOUTH DAILY FOR PROSTATE   HYDRALAZINE (APRESOLINE) 50 MG TABLET    Take 50 mg by mouth every 8 (eight) hours.   LATANOPROST (XALATAN) 0.005 % OPHTHALMIC SOLUTION    Place 1 drop into both eyes at bedtime.    LOSARTAN (COZAAR) 50 MG TABLET    Take 50 mg by mouth daily.   NIFEDIPINE (PROCARDIA XL/ADALAT-CC) 60 MG 24 HR TABLET    Take 60 mg by mouth daily.   NITROGLYCERIN (NITROSTAT) 0.4 MG SL TABLET    Place 1 tablet (0.4 mg total) under the tongue every 5 (five) minutes as needed for chest pain.   ONDANSETRON (ZOFRAN) 4 MG TABLET    Take 1 tablet (4 mg total) by mouth every 8 (eight) hours as needed for nausea or vomiting.   TIMOLOL (TIMOPTIC) 0.5 % OPHTHALMIC SOLUTION    Place 1 drop into both eyes daily.    TRIAMCINOLONE CREAM (KENALOG) 0.1 %    Apply sparingly up to twice daily to scaley areas on hands  Modified Medications   No medications on file  Discontinued Medications   No medications on file     Physical Exam:  Vitals:   05/13/17 1542 05/13/17 1605  BP: (!) 188/98 (!) 166/60  Pulse: 74   Resp: 17   Temp: 97.8 F (36.6 C)   TempSrc: Oral   SpO2: 96%    There is no height or weight on file to calculate BMI.  Physical Exam  Constitutional: He is oriented to person, place, and time. He appears well-developed and well-nourished. No distress.  HENT:  Head: Normocephalic and atraumatic.  Right Ear: External ear normal.  Left Ear: External ear normal.  Nose: Nose normal.  Mouth/Throat: Oropharynx is clear and moist. No  oropharyngeal exudate.  Cerumen impaction to left  Eyes: Conjunctivae and EOM are normal. Pupils are equal, round, and reactive to light.  Neck: Normal range of motion. Neck supple. No JVD present. No tracheal deviation present. No thyromegaly present.  Cardiovascular: Normal rate, regular rhythm and normal heart sounds.  Exam reveals no gallop and no friction rub.   No murmur heard. Pulmonary/Chest: Effort normal and breath sounds normal. No respiratory distress. He has no wheezes. He has no rales. He exhibits no tenderness.  Abdominal: Soft. Bowel sounds are normal. He exhibits no distension and no mass. There is no tenderness.  Musculoskeletal: Normal range of motion. He exhibits no edema or tenderness.  Lymphadenopathy:    He has no cervical adenopathy.  Neurological: He is alert and oriented to person, place,  and time. He has normal reflexes. He displays normal reflexes. No cranial nerve deficit or sensory deficit. He exhibits normal muscle tone. Coordination normal.  Skin: Skin is warm and dry. No rash noted. He is not diaphoretic. No erythema. No pallor.  Multiple seborrheic keratoses  Psychiatric: He has a normal mood and affect. His behavior is normal. Judgment and thought content normal.    Labs reviewed: Basic Metabolic Panel:  Recent Labs  08/26/16 1340 05/11/17 1640  NA 141 137  K 4.5 4.3  CL 107 104  CO2 25 22  GLUCOSE 109* 125*  BUN 17 19  CREATININE 1.07 1.05  CALCIUM 9.1 9.3   Liver Function Tests:  Recent Labs  05/11/17 1640  AST 14  ALT 9  ALKPHOS 75  BILITOT 0.6  PROT 6.6  ALBUMIN 4.3    Recent Labs  05/11/17 1640  LIPASE 27  AMYLASE 32   No results for input(s): AMMONIA in the last 8760 hours. CBC:  Recent Labs  08/26/16 1340 05/11/17 1640  WBC 7.1 6.4  NEUTROABS 4,828 4,224  HGB 12.4* 13.1*  HCT 38.0* 39.4  MCV 88.0 87.0  PLT 141 146   Lipid Panel:  Recent Labs  08/26/16 1340  CHOL 103*  HDL 50  LDLCALC 32  TRIG 103   CHOLHDL 2.1   TSH: No results for input(s): TSH in the last 8760 hours. A1C: Lab Results  Component Value Date   HGBA1C 6.4 (H) 04/15/2016     Assessment/Plan 1. Dizziness -significantly worse in the last 2 days. Suspect nausea is coming from vertigo.  -increase hydration  - EKG 12-Lead- pt with hx of MI, EKG without acute changes, reviewed with Dr Mariea Clonts. EKG with SR and first degree block rate 71  - BMP with eGFR - CBC with Differential/Platelets - TSH - meclizine (ANTIVERT) 25 MG tablet; Take 0.5-1 tablets (12.5-25 mg total) by mouth 3 (three) times daily as needed for dizziness.  Dispense: 30 tablet; Refill: 1 - CT Head Wo Contrast; Future due to dizziness with blurred vision.  - POCT urinalysis dipstick- WNL  2. Impacted cerumen of left ear - Ear Lavage successful, large amount on cerumen evacuated.   To follow up in 1 month, sooner if needed Jessica K. Harle Battiest  [facility] [phone] 8 am - 5 pm) [phone] (after hours)

## 2017-05-13 NOTE — Telephone Encounter (Signed)
Question if he has a UTI, please have him come in for an appt this afternoon so we can check this. Also may need to redraw lab work to see if something else has come up

## 2017-05-13 NOTE — Telephone Encounter (Signed)
John SpryGail, daughter called and stated that patient is no better. Stated that he is not worse but no better either. Patient still has Dizziness and Nausea. Not eating much at all. Drank Gatorade and got sick from that. Stated that he is active and goes out but hasn't gone out of the house all week.  Daughter wants to speak with you concerning her dad. I offered appointment but she stated that he was just in and his bloodwork came back fine. Please Advise.   Daughter would like for you to call her at:[phone]

## 2017-05-13 NOTE — Patient Instructions (Signed)
To use meclizine 12.5-25 mg every 8 hours as needed for dizziness Start with 12.5 mg and increase to 25 if needed

## 2017-05-14 LAB — BASIC METABOLIC PANEL WITH GFR
BUN: 17 mg/dL (ref 7–25)
CHLORIDE: 103 mmol/L (ref 98–110)
CO2: 25 mmol/L (ref 20–31)
Calcium: 9.2 mg/dL (ref 8.6–10.3)
Creat: 1.26 mg/dL — ABNORMAL HIGH (ref 0.70–1.11)
GFR, Est African American: 59 mL/min — ABNORMAL LOW (ref >=60)
GFR, Est Non African American: 51 mL/min — ABNORMAL LOW (ref >=60)
GLUCOSE: 118 mg/dL — AB (ref 65–99)
POTASSIUM: 4.8 mmol/L (ref 3.5–5.3)
Sodium: 138 mmol/L (ref 135–146)

## 2017-05-20 ENCOUNTER — Telehealth: Payer: Self-pay

## 2017-05-20 NOTE — Telephone Encounter (Signed)
Claris GladdenGale called and stated that patient is not have any other allergy symptoms so she will monitor cough and call if it gets worse.   Claris GladdenGale also stated that patient is not have any visual changes.

## 2017-05-20 NOTE — Telephone Encounter (Signed)
CT has been cancelled.

## 2017-05-20 NOTE — Telephone Encounter (Signed)
I called patient's daughter, Claris GladdenGale, to see how patient was doing. Claris GladdenGale stated that pt is doing better, his dizzy spells have subsided and he is getting back to his old self.   She did state that patient has developed a dry cough in the morning and late evening and she wanted recommendations as to what could be done.   Left message for Claris GladdenGale to call the office to discuss following information:   Per [name], monitor cough and call if it worsens. If pt has allergy symptoms along with cough, it is ok to use OTC allergy tablets.   [name] also wanted to know if pt is still having visual changes. If he is not, then head CT can be cancelled.

## 2017-05-20 NOTE — Telephone Encounter (Signed)
Noted, since there are no visual changes and dizziness has improved we will cancel CT and reassess if reoccurs

## 2017-06-08 DIAGNOSIS — I5020 Unspecified systolic (congestive) heart failure: Secondary | ICD-10-CM | POA: Diagnosis not present

## 2017-06-22 ENCOUNTER — Encounter: Payer: Self-pay | Admitting: Nurse Practitioner

## 2017-06-22 ENCOUNTER — Ambulatory Visit (INDEPENDENT_AMBULATORY_CARE_PROVIDER_SITE_OTHER): Payer: Medicare HMO | Admitting: Nurse Practitioner

## 2017-06-22 VITALS — BP 158/64 | HR 63 | Temp 97.4°F | Resp 17 | Ht 69.0 in | Wt 174.4 lb

## 2017-06-22 DIAGNOSIS — I10 Essential (primary) hypertension: Secondary | ICD-10-CM | POA: Diagnosis not present

## 2017-06-22 DIAGNOSIS — R42 Dizziness and giddiness: Secondary | ICD-10-CM

## 2017-06-22 DIAGNOSIS — E119 Type 2 diabetes mellitus without complications: Secondary | ICD-10-CM | POA: Diagnosis not present

## 2017-06-22 DIAGNOSIS — I2510 Atherosclerotic heart disease of native coronary artery without angina pectoris: Secondary | ICD-10-CM

## 2017-06-22 DIAGNOSIS — E785 Hyperlipidemia, unspecified: Secondary | ICD-10-CM | POA: Diagnosis not present

## 2017-06-22 MED ORDER — LOSARTAN POTASSIUM 100 MG PO TABS: 100.0000 mg | ORAL_TABLET | Freq: Every day | ORAL | 3 refills | Status: DC

## 2017-06-22 NOTE — Patient Instructions (Signed)
Increase losartan to 100 mg daily (new Rx sent to pharmacy) can take 2 of the 50 mg tablets until you run out.   DASH Eating Plan DASH stands for "Dietary Approaches to Stop Hypertension." The DASH eating plan is a healthy eating plan that has been shown to reduce high blood pressure (hypertension). It may also reduce your risk for type 2 diabetes, heart disease, and stroke. The DASH eating plan may also help with weight loss. What are tips for following this plan? General guidelines  Avoid eating more than 2,300 mg (milligrams) of salt (sodium) a day. If you have hypertension, you may need to reduce your sodium intake to 1,500 mg a day.  Limit alcohol intake to no more than 1 drink a day for nonpregnant women and 2 drinks a day for men. One drink equals 12 oz of beer, 5 oz of wine, or 1 oz of hard liquor.  Work with your health care provider to maintain a healthy body weight or to lose weight. Ask what an ideal weight is for you.  Get at least 30 minutes of exercise that causes your heart to beat faster (aerobic exercise) most days of the week. Activities may include walking, swimming, or biking.  Work with your health care provider or diet and nutrition specialist (dietitian) to adjust your eating plan to your individual calorie needs. Reading food labels  Check food labels for the amount of sodium per serving. Choose foods with less than 5 percent of the Daily Value of sodium. Generally, foods with less than 300 mg of sodium per serving fit into this eating plan.  To find whole grains, look for the word "whole" as the first word in the ingredient list. Shopping  Buy products labeled as "low-sodium" or "no salt added."  Buy fresh foods. Avoid canned foods and premade or frozen meals. Cooking  Avoid adding salt when cooking. Use salt-free seasonings or herbs instead of table salt or sea salt. Check with your health care provider or pharmacist before using salt substitutes.  Do not fry  foods. Cook foods using healthy methods such as baking, boiling, grilling, and broiling instead.  Cook with heart-healthy oils, such as olive, canola, soybean, or sunflower oil. Meal planning   Eat a balanced diet that includes: ? 5 or more servings of fruits and vegetables each day. At each meal, try to fill half of your plate with fruits and vegetables. ? Up to 6-8 servings of whole grains each day. ? Less than 6 oz of lean meat, poultry, or fish each day. A 3-oz serving of meat is about the same size as a deck of cards. One egg equals 1 oz. ? 2 servings of low-fat dairy each day. ? A serving of nuts, seeds, or beans 5 times each week. ? Heart-healthy fats. Healthy fats called Omega-3 fatty acids are found in foods such as flaxseeds and coldwater fish, like sardines, salmon, and mackerel.  Limit how much you eat of the following: ? Canned or prepackaged foods. ? Food that is high in trans fat, such as fried foods. ? Food that is high in saturated fat, such as fatty meat. ? Sweets, desserts, sugary drinks, and other foods with added sugar. ? Full-fat dairy products.  Do not salt foods before eating.  Try to eat at least 2 vegetarian meals each week.  Eat more home-cooked food and less restaurant, buffet, and fast food.  When eating at a restaurant, ask that your food be prepared with less  salt or no salt, if possible. What foods are recommended? The items listed may not be a complete list. Talk with your dietitian about what dietary choices are best for you. Grains Whole-grain or whole-wheat bread. Whole-grain or whole-wheat pasta. Brown rice. [place]row. Bulgur. Whole-grain and low-sodium cereals. Pita bread. Low-fat, low-sodium crackers. Whole-wheat flour tortillas. Vegetables Fresh or frozen vegetables (raw, steamed, roasted, or grilled). Low-sodium or reduced-sodium tomato and vegetable juice. Low-sodium or reduced-sodium tomato sauce and tomato paste. Low-sodium or  reduced-sodium canned vegetables. Fruits All fresh, dried, or frozen fruit. Canned fruit in natural juice (without added sugar). Meat and other protein foods Skinless chicken or [place]. Ground chicken or [place]. Pork with fat trimmed off. Fish and seafood. Egg whites. Dried beans, peas, or lentils. Unsalted nuts, nut butters, and seeds. Unsalted canned beans. Lean cuts of beef with fat trimmed off. Low-sodium, lean deli meat. Dairy Low-fat (1%) or fat-free (skim) milk. Fat-free, low-fat, or reduced-fat cheeses. Nonfat, low-sodium ricotta or cottage cheese. Low-fat or nonfat yogurt. Low-fat, low-sodium cheese. Fats and oils Soft margarine without trans fats. Vegetable oil. Low-fat, reduced-fat, or light mayonnaise and salad dressings (reduced-sodium). Canola, safflower, olive, soybean, and sunflower oils. Avocado. Seasoning and other foods Herbs. Spices. Seasoning mixes without salt. Unsalted popcorn and pretzels. Fat-free sweets. What foods are not recommended? The items listed may not be a complete list. Talk with your dietitian about what dietary choices are best for you. Grains Baked goods made with fat, such as croissants, muffins, or some breads. Dry pasta or rice meal packs. Vegetables Creamed or fried vegetables. Vegetables in a cheese sauce. Regular canned vegetables (not low-sodium or reduced-sodium). Regular canned tomato sauce and paste (not low-sodium or reduced-sodium). Regular tomato and vegetable juice (not low-sodium or reduced-sodium). Angie Fava. Olives. Fruits Canned fruit in a light or heavy syrup. Fried fruit. Fruit in cream or butter sauce. Meat and other protein foods Fatty cuts of meat. Ribs. Fried meat. Berniece Salines. Sausage. Bologna and other processed lunch meats. Salami. Fatback. Hotdogs. Bratwurst. Salted nuts and seeds. Canned beans with added salt. Canned or smoked fish. Whole eggs or egg yolks. Chicken or [place] with skin. Dairy Whole or 2% milk, cream, and half-and-half.  Whole or full-fat cream cheese. Whole-fat or sweetened yogurt. Full-fat cheese. Nondairy creamers. Whipped toppings. Processed cheese and cheese spreads. Fats and oils Butter. Stick margarine. Lard. Shortening. Ghee. Bacon fat. Tropical oils, such as coconut, palm kernel, or palm oil. Seasoning and other foods Salted popcorn and pretzels. Onion salt, garlic salt, seasoned salt, table salt, and sea salt. Worcestershire sauce. Tartar sauce. Barbecue sauce. Teriyaki sauce. Soy sauce, including reduced-sodium. Steak sauce. Canned and packaged gravies. Fish sauce. Oyster sauce. Cocktail sauce. Horseradish that you find on the shelf. Ketchup. Mustard. Meat flavorings and tenderizers. Bouillon cubes. Hot sauce and Tabasco sauce. Premade or packaged marinades. Premade or packaged taco seasonings. Relishes. Regular salad dressings. Where to find more information:  National Heart, Lung, and [place]: https://wilson-eaton.com/  American Heart Association: www.heart.org Summary  The DASH eating plan is a healthy eating plan that has been shown to reduce high blood pressure (hypertension). It may also reduce your risk for type 2 diabetes, heart disease, and stroke.  With the DASH eating plan, you should limit salt (sodium) intake to 2,300 mg a day. If you have hypertension, you may need to reduce your sodium intake to 1,500 mg a day.  When on the DASH eating plan, aim to eat more fresh fruits and vegetables, whole grains, lean proteins, low-fat  dairy, and heart-healthy fats.  Work with your health care provider or diet and nutrition specialist (dietitian) to adjust your eating plan to your individual calorie needs. This information is not intended to replace advice given to you by your health care provider. Make sure you discuss any questions you have with your health care provider. Document Released: 10/29/2011 Document Revised: 11/02/2016 Document Reviewed: 11/02/2016 Elsevier Interactive Patient Education   2017 [employer].

## 2017-06-22 NOTE — Progress Notes (Signed)
Careteam: Patient Care Team: Estill Dooms, MD as PCP - General (Internal Medicine) Monna Fam, MD as Consulting Physician (Ophthalmology) Adrian Prows, MD as Consulting Physician (Cardiology)  Advanced Directive information Does Patient Have a Medical Advance Directive?: No  No Known Allergies  Chief Complaint  Patient presents with  . Medical Management of Chronic Issues    Pt is being seen for a 1 month routine follow up on chronic conditions. Pt reports that he is doing well.      HPI: Patient is a 83 y.o. male seen in the office today for 1 month follow up. Pt was seen last month due to nausea and dizziness.  Reports he is feeling great. No nausea or dizziness. Has not needed meclizine for dizziness   htn- does not take blood pressure at home. Does not add salt to food. Compliant with medications.   Uses a cane to avoid falling, no recent falls. Does not feel unsteady   Appetite has been good.   Review of Systems:  Review of Systems  Constitutional: Negative for chills, fever, malaise/fatigue and weight loss.  HENT: Negative for congestion, ear discharge, nosebleeds and tinnitus.   Respiratory: Negative for cough, sputum production and shortness of breath.   Cardiovascular: Negative for chest pain, palpitations and leg swelling.  Gastrointestinal: Negative for abdominal pain, constipation, diarrhea, heartburn, nausea and vomiting.  Genitourinary: Negative for dysuria, frequency and urgency.  Musculoskeletal: Negative for back pain, falls, joint pain and myalgias.  Skin: Negative.   Neurological: Negative for dizziness, tingling, tremors, sensory change, speech change, focal weakness, seizures, loss of consciousness and headaches.  Psychiatric/Behavioral: Negative for depression and memory loss. The patient does not have insomnia.     Past Medical History:  Diagnosis Date  . Abnormality of gait   . Actinic keratosis   . Cognitive deficits, late effect of  cerebrovascular disease   . Contact dermatitis and other eczema due to other specified agent   . Dizziness and giddiness   . Elevated prostate specific antigen (PSA)   . Essential and other specified forms of tremor   . First degree atrioventricular block   . Gastric ulcer, unspecified as acute or chronic, without mention of hemorrhage, perforation, or obstruction   . Hypertrophy of prostate without urinary obstruction and other lower urinary tract symptoms (LUTS)   . Impotence of organic origin   . Insomnia, unspecified   . Obesity, unspecified   . Other abnormal blood chemistry   . Pain in joint, lower leg   . STEMI (ST elevation myocardial infarction) ([place]) 05/07/2014  . Unspecified essential hypertension   . Unspecified glaucoma(365.9)   . Unspecified vitamin D deficiency   . Urge incontinence   . Urinary frequency    Past Surgical History:  Procedure Laterality Date  . APPENDECTOMY  1930's?  Marland Kitchen CATARACT EXTRACTION W/ INTRAOCULAR LENS  IMPLANT, BILATERAL Bilateral   . CORONARY ANGIOPLASTY WITH STENT PLACEMENT  05/07/2014   "1"  . CYST EXCISION Left 1977   "knee"  . LEFT HEART CATHETERIZATION WITH CORONARY ANGIOGRAM N/A 05/07/2014   Procedure: LEFT HEART CATHETERIZATION WITH CORONARY ANGIOGRAM;  Surgeon: Laverda Page, MD;  [facility] C[facility] LAB;  Service: Cardiovascular;  Laterality: N/A;  . MULTIPLE TOOTH EXTRACTIONS    . TRANSURETHRAL RESECTION OF PROSTATE  1987   Social History:   reports that he has quit smoking. His smoking use included Pipe. His smokeless tobacco use includes Chew. He reports that he does not drink alcohol or  use drugs.  Family History  Problem Relation Age of Onset  . Stroke Mother   . Stroke Sister     Medications: Patient's Medications  New Prescriptions   No medications on file  Previous Medications   ASPIRIN 81 MG CHEWABLE TABLET    Chew 81 mg by mouth daily.   ATORVASTATIN (LIPITOR) 10 MG TABLET    Take 10 mg by mouth daily.    CLOPIDOGREL (PLAVIX) 75 MG TABLET    Take 75 mg by mouth daily.   FINASTERIDE (PROSCAR) 5 MG TABLET    TAKE 1 TABLET BY MOUTH DAILY FOR PROSTATE   HYDRALAZINE (APRESOLINE) 50 MG TABLET    Take 50 mg by mouth every 8 (eight) hours.   LATANOPROST (XALATAN) 0.005 % OPHTHALMIC SOLUTION    Place 1 drop into both eyes at bedtime.    LOSARTAN (COZAAR) 50 MG TABLET    Take 50 mg by mouth daily.   MECLIZINE (ANTIVERT) 25 MG TABLET    Take 0.5-1 tablets (12.5-25 mg total) by mouth 3 (three) times daily as needed for dizziness.   NIFEDIPINE (PROCARDIA XL/ADALAT-CC) 60 MG 24 HR TABLET    Take 60 mg by mouth daily.   NITROGLYCERIN (NITROSTAT) 0.4 MG SL TABLET    Place 1 tablet (0.4 mg total) under the tongue every 5 (five) minutes as needed for chest pain.   TIMOLOL (TIMOPTIC) 0.5 % OPHTHALMIC SOLUTION    Place 1 drop into both eyes daily.    TRIAMCINOLONE CREAM (KENALOG) 0.1 %    Apply sparingly up to twice daily to scaley areas on hands  Modified Medications   No medications on file  Discontinued Medications   No medications on file     Physical Exam:  Vitals:   06/22/17 1258  BP: (!) 158/64  Pulse: 63  Resp: 17  Temp: (!) 97.4 F (36.3 C)  TempSrc: Oral  SpO2: 97%  Weight: 174 lb 6.4 oz (79.1 kg)  Height: _0  (1.753 m)   Body mass index is 25.75 kg/m.  Physical Exam  Constitutional: He is oriented to person, place, and time. He appears well-developed and well-nourished. No distress.  HENT:  Head: Normocephalic and atraumatic.  Nose: Nose normal.  Mouth/Throat: Oropharynx is clear and moist. No oropharyngeal exudate.  Eyes: Pupils are equal, round, and reactive to light. Conjunctivae and EOM are normal.  Neck: Normal range of motion. Neck supple. No JVD present. No tracheal deviation present. No thyromegaly present.  Cardiovascular: Normal rate, regular rhythm and normal heart sounds.  Exam reveals no gallop and no friction rub.   No murmur heard. Pulmonary/Chest: Effort normal and  breath sounds normal.  Abdominal: Soft. Bowel sounds are normal. He exhibits no distension and no mass. There is no tenderness.  Musculoskeletal: Normal range of motion. He exhibits no edema or tenderness.  Lymphadenopathy:    He has no cervical adenopathy.  Neurological: He is alert and oriented to person, place, and time. He has normal reflexes. He displays normal reflexes. No cranial nerve deficit or sensory deficit. He exhibits normal muscle tone. Coordination normal.  Skin: Skin is warm and dry. No rash noted. He is not diaphoretic. No erythema. No pallor.  Multiple seborrheic keratoses  Psychiatric: He has a normal mood and affect. His behavior is normal. Judgment and thought content normal.    Labs reviewed: Basic Metabolic Panel:  Recent Labs  08/26/16 1340 05/11/17 1640 05/13/17 1628 05/13/17 1635  NA 141 137  --  138  K  4.5 4.3  --  4.8  CL 107 104  --  103  CO2 25 22  --  25  GLUCOSE 109* 125*  --  118*  BUN 17 19  --  17  CREATININE 1.07 1.05  --  1.26*  CALCIUM 9.1 9.3  --  9.2  TSH  --   --  1.36  --    Liver Function Tests:  Recent Labs  05/11/17 1640  AST 14  ALT 9  ALKPHOS 75  BILITOT 0.6  PROT 6.6  ALBUMIN 4.3    Recent Labs  05/11/17 1640  LIPASE 27  AMYLASE 32   No results for input(s): AMMONIA in the last 8760 hours. CBC:  Recent Labs  08/26/16 1340 05/11/17 1640 05/13/17 1635  WBC 7.1 6.4 7.5  NEUTROABS 4,828 4,224 5,025  HGB 12.4* 13.1* 13.1*  HCT 38.0* 39.4 39.8  MCV 88.0 87.0 87.7  PLT 141 146 139*   Lipid Panel:  Recent Labs  08/26/16 1340  CHOL 103*  HDL 50  LDLCALC 32  TRIG 103  CHOLHDL 2.1   TSH:  Recent Labs  05/13/17 1628  TSH 1.36   A1C: Lab Results  Component Value Date   HGBA1C 6.4 (H) 04/15/2016     Assessment/Plan 1. Type 2 diabetes mellitus without complication, without long-term current use of insulin (HCC) -diet controlled.  - Hemoglobin A1c; Future  2. Coronary artery disease  involving native coronary artery of native heart without angina pectoris Stable, without chest pains at this time. conts on ASA 81 mg and plavix.   3. Essential hypertension -not controlled, conts on nifedipine, hydralazine and losartan -dash diet recommended -to increase losartan to 100 mg daily  - losartan (COZAAR) 100 MG tablet; Take 1 tablet (100 mg total) by mouth daily.  Dispense: 30 tablet; Refill: 3 - CMP with eGFR; Future  4. Dizziness Resolved   5. Hyperlipidemia, unspecified hyperlipidemia type -conts on lipitor - Lipid Panel; Future - CMP with eGFR; Future   Christien Berthelot K. Harle Battiest  [facility] [phone] 8 am - 5 pm) [phone] (after hours)

## 2017-07-01 ENCOUNTER — Other Ambulatory Visit: Payer: Self-pay | Admitting: Internal Medicine

## 2017-07-09 DIAGNOSIS — I5020 Unspecified systolic (congestive) heart failure: Secondary | ICD-10-CM | POA: Diagnosis not present

## 2017-07-22 DIAGNOSIS — H401112 Primary open-angle glaucoma, right eye, moderate stage: Secondary | ICD-10-CM | POA: Diagnosis not present

## 2017-07-22 DIAGNOSIS — H401123 Primary open-angle glaucoma, left eye, severe stage: Secondary | ICD-10-CM | POA: Diagnosis not present

## 2017-07-22 DIAGNOSIS — H47292 Other optic atrophy, left eye: Secondary | ICD-10-CM | POA: Diagnosis not present

## 2017-07-22 DIAGNOSIS — H40051 Ocular hypertension, right eye: Secondary | ICD-10-CM | POA: Diagnosis not present

## 2017-08-09 DIAGNOSIS — I5020 Unspecified systolic (congestive) heart failure: Secondary | ICD-10-CM | POA: Diagnosis not present

## 2017-09-08 DIAGNOSIS — I5020 Unspecified systolic (congestive) heart failure: Secondary | ICD-10-CM | POA: Diagnosis not present

## 2017-09-16 DIAGNOSIS — H04129 Dry eye syndrome of unspecified lacrimal gland: Secondary | ICD-10-CM | POA: Diagnosis not present

## 2017-09-16 DIAGNOSIS — H401123 Primary open-angle glaucoma, left eye, severe stage: Secondary | ICD-10-CM | POA: Diagnosis not present

## 2017-09-16 DIAGNOSIS — H401112 Primary open-angle glaucoma, right eye, moderate stage: Secondary | ICD-10-CM | POA: Diagnosis not present

## 2017-09-16 DIAGNOSIS — H01003 Unspecified blepharitis right eye, unspecified eyelid: Secondary | ICD-10-CM | POA: Diagnosis not present

## 2017-09-23 ENCOUNTER — Other Ambulatory Visit: Payer: Medicare HMO

## 2017-09-27 ENCOUNTER — Ambulatory Visit: Payer: Medicare HMO | Admitting: Nurse Practitioner

## 2017-09-30 DIAGNOSIS — E7800 Pure hypercholesterolemia, unspecified: Secondary | ICD-10-CM | POA: Diagnosis not present

## 2017-09-30 DIAGNOSIS — I2510 Atherosclerotic heart disease of native coronary artery without angina pectoris: Secondary | ICD-10-CM | POA: Diagnosis not present

## 2017-09-30 DIAGNOSIS — I10 Essential (primary) hypertension: Secondary | ICD-10-CM | POA: Diagnosis not present

## 2017-09-30 DIAGNOSIS — I351 Nonrheumatic aortic (valve) insufficiency: Secondary | ICD-10-CM | POA: Diagnosis not present

## 2017-10-01 ENCOUNTER — Other Ambulatory Visit: Payer: Self-pay

## 2017-10-01 MED ORDER — FINASTERIDE 5 MG PO TABS: ORAL_TABLET | ORAL | 1 refills | Status: DC

## 2017-10-01 NOTE — Telephone Encounter (Signed)
A refill request was received from [facility] for finasteride 5 mg tablets. Rx was sent to pharmacy electronically for #90 with no RF.

## 2017-10-07 ENCOUNTER — Telehealth: Payer: Self-pay

## 2017-10-07 DIAGNOSIS — I351 Nonrheumatic aortic (valve) insufficiency: Secondary | ICD-10-CM | POA: Diagnosis not present

## 2017-10-07 DIAGNOSIS — I2510 Atherosclerotic heart disease of native coronary artery without angina pectoris: Secondary | ICD-10-CM | POA: Diagnosis not present

## 2017-10-07 DIAGNOSIS — I10 Essential (primary) hypertension: Secondary | ICD-10-CM | POA: Diagnosis not present

## 2017-10-07 DIAGNOSIS — I129 Hypertensive chronic kidney disease with stage 1 through stage 4 chronic kidney disease, or unspecified chronic kidney disease: Secondary | ICD-10-CM | POA: Diagnosis not present

## 2017-10-07 NOTE — Telephone Encounter (Signed)
Medication list has been updated based on message from Dr. Yates DecampJay Ganji.

## 2017-10-07 NOTE — Telephone Encounter (Signed)
-----   Message from Yates DecampJay Ganji, MD sent at 10/07/2017  9:47 AM EST ----- Regarding: Medicatoins We discontinued Losartan as he was already on valsartan, 320 mg daily.  He is presently also metoprolol succinate 50 mg daily.  We discontinued clopidogrel.otherwise all the medications are as in your list.  JG.  He has not had his lipids or Aic

## 2017-10-09 DIAGNOSIS — I5020 Unspecified systolic (congestive) heart failure: Secondary | ICD-10-CM | POA: Diagnosis not present

## 2017-10-25 DIAGNOSIS — I2510 Atherosclerotic heart disease of native coronary artery without angina pectoris: Secondary | ICD-10-CM | POA: Diagnosis not present

## 2017-10-25 DIAGNOSIS — I10 Essential (primary) hypertension: Secondary | ICD-10-CM | POA: Diagnosis not present

## 2017-10-25 DIAGNOSIS — I351 Nonrheumatic aortic (valve) insufficiency: Secondary | ICD-10-CM | POA: Diagnosis not present

## 2017-11-08 DIAGNOSIS — I5020 Unspecified systolic (congestive) heart failure: Secondary | ICD-10-CM | POA: Diagnosis not present

## 2017-11-30 DIAGNOSIS — I2510 Atherosclerotic heart disease of native coronary artery without angina pectoris: Secondary | ICD-10-CM | POA: Diagnosis not present

## 2017-11-30 DIAGNOSIS — I351 Nonrheumatic aortic (valve) insufficiency: Secondary | ICD-10-CM | POA: Diagnosis not present

## 2017-11-30 DIAGNOSIS — N182 Chronic kidney disease, stage 2 (mild): Secondary | ICD-10-CM | POA: Diagnosis not present

## 2017-11-30 DIAGNOSIS — I129 Hypertensive chronic kidney disease with stage 1 through stage 4 chronic kidney disease, or unspecified chronic kidney disease: Secondary | ICD-10-CM | POA: Diagnosis not present

## 2017-12-06 ENCOUNTER — Telehealth: Payer: Self-pay | Admitting: Nurse Practitioner

## 2017-12-06 NOTE — Telephone Encounter (Signed)
I called the patient to confirm his AWV, but he asked that I cancel the appointment.  He stated that he sees his heart doctor on a regular basis.  I tried several times to explain the benefits of an AWV and also seeing a primary care doctor.  However, the patient still refused the appointment. VDM (DD)

## 2017-12-07 ENCOUNTER — Ambulatory Visit: Payer: Medicare HMO

## 2017-12-09 DIAGNOSIS — I5020 Unspecified systolic (congestive) heart failure: Secondary | ICD-10-CM | POA: Diagnosis not present

## 2017-12-29 ENCOUNTER — Telehealth: Payer: Self-pay

## 2017-12-29 DIAGNOSIS — Z0189 Encounter for other specified special examinations: Secondary | ICD-10-CM | POA: Diagnosis not present

## 2017-12-29 DIAGNOSIS — I10 Essential (primary) hypertension: Secondary | ICD-10-CM | POA: Diagnosis not present

## 2017-12-29 DIAGNOSIS — I2510 Atherosclerotic heart disease of native coronary artery without angina pectoris: Secondary | ICD-10-CM | POA: Diagnosis not present

## 2017-12-29 NOTE — Telephone Encounter (Signed)
I called patient's daughter's mobile number to see about scheduling an appointment. Patient's home number is not working at this time.

## 2018-01-09 DIAGNOSIS — I5020 Unspecified systolic (congestive) heart failure: Secondary | ICD-10-CM | POA: Diagnosis not present

## 2018-02-06 DIAGNOSIS — I5020 Unspecified systolic (congestive) heart failure: Secondary | ICD-10-CM | POA: Diagnosis not present

## 2018-02-19 ENCOUNTER — Other Ambulatory Visit: Payer: Self-pay | Admitting: Nurse Practitioner

## 2018-02-21 DIAGNOSIS — E7800 Pure hypercholesterolemia, unspecified: Secondary | ICD-10-CM | POA: Diagnosis not present

## 2018-02-21 DIAGNOSIS — I2510 Atherosclerotic heart disease of native coronary artery without angina pectoris: Secondary | ICD-10-CM | POA: Diagnosis not present

## 2018-02-23 ENCOUNTER — Telehealth: Payer: Self-pay

## 2018-02-23 DIAGNOSIS — G3184 Mild cognitive impairment, so stated: Secondary | ICD-10-CM | POA: Diagnosis not present

## 2018-02-23 DIAGNOSIS — N400 Enlarged prostate without lower urinary tract symptoms: Secondary | ICD-10-CM | POA: Diagnosis not present

## 2018-02-23 DIAGNOSIS — Z7982 Long term (current) use of aspirin: Secondary | ICD-10-CM | POA: Diagnosis not present

## 2018-02-23 DIAGNOSIS — I509 Heart failure, unspecified: Secondary | ICD-10-CM | POA: Diagnosis not present

## 2018-02-23 DIAGNOSIS — N529 Male erectile dysfunction, unspecified: Secondary | ICD-10-CM | POA: Diagnosis not present

## 2018-02-23 DIAGNOSIS — E119 Type 2 diabetes mellitus without complications: Secondary | ICD-10-CM | POA: Diagnosis not present

## 2018-02-23 DIAGNOSIS — I110 Hypertensive heart disease with heart failure: Secondary | ICD-10-CM | POA: Diagnosis not present

## 2018-02-23 DIAGNOSIS — E785 Hyperlipidemia, unspecified: Secondary | ICD-10-CM | POA: Diagnosis not present

## 2018-02-23 DIAGNOSIS — I25119 Atherosclerotic heart disease of native coronary artery with unspecified angina pectoris: Secondary | ICD-10-CM | POA: Diagnosis not present

## 2018-02-23 DIAGNOSIS — R32 Unspecified urinary incontinence: Secondary | ICD-10-CM | POA: Diagnosis not present

## 2018-02-23 NOTE — Telephone Encounter (Signed)
Left message on voicemail for patient to return call when available  (mobile number)   Called home number, call would not go through, reason for call: inform patient he is overdue for an appointment  I called the pharmacy and had them cancel refill that was approved on 02/21/18 and inform patient he needs an appointment if he inquires about it (per Sharon SellerEubanks, Jessica K, NP)

## 2018-02-28 DIAGNOSIS — I10 Essential (primary) hypertension: Secondary | ICD-10-CM | POA: Diagnosis not present

## 2018-02-28 DIAGNOSIS — I2510 Atherosclerotic heart disease of native coronary artery without angina pectoris: Secondary | ICD-10-CM | POA: Diagnosis not present

## 2018-02-28 DIAGNOSIS — I351 Nonrheumatic aortic (valve) insufficiency: Secondary | ICD-10-CM | POA: Diagnosis not present

## 2018-02-28 DIAGNOSIS — R5381 Other malaise: Secondary | ICD-10-CM | POA: Diagnosis not present

## 2018-03-09 DIAGNOSIS — I5020 Unspecified systolic (congestive) heart failure: Secondary | ICD-10-CM | POA: Diagnosis not present

## 2018-03-10 DIAGNOSIS — H401123 Primary open-angle glaucoma, left eye, severe stage: Secondary | ICD-10-CM | POA: Diagnosis not present

## 2018-03-10 DIAGNOSIS — H01003 Unspecified blepharitis right eye, unspecified eyelid: Secondary | ICD-10-CM | POA: Diagnosis not present

## 2018-03-10 DIAGNOSIS — H401112 Primary open-angle glaucoma, right eye, moderate stage: Secondary | ICD-10-CM | POA: Diagnosis not present

## 2018-03-10 DIAGNOSIS — H04129 Dry eye syndrome of unspecified lacrimal gland: Secondary | ICD-10-CM | POA: Diagnosis not present

## 2018-03-14 DIAGNOSIS — I10 Essential (primary) hypertension: Secondary | ICD-10-CM | POA: Diagnosis not present

## 2018-03-16 DIAGNOSIS — I10 Essential (primary) hypertension: Secondary | ICD-10-CM | POA: Diagnosis not present

## 2018-03-16 DIAGNOSIS — I440 Atrioventricular block, first degree: Secondary | ICD-10-CM | POA: Diagnosis not present

## 2018-03-16 DIAGNOSIS — I2510 Atherosclerotic heart disease of native coronary artery without angina pectoris: Secondary | ICD-10-CM | POA: Diagnosis not present

## 2018-03-16 DIAGNOSIS — I351 Nonrheumatic aortic (valve) insufficiency: Secondary | ICD-10-CM | POA: Diagnosis not present

## 2018-04-08 DIAGNOSIS — I5020 Unspecified systolic (congestive) heart failure: Secondary | ICD-10-CM | POA: Diagnosis not present

## 2018-04-25 DIAGNOSIS — H401123 Primary open-angle glaucoma, left eye, severe stage: Secondary | ICD-10-CM | POA: Diagnosis not present

## 2018-04-25 DIAGNOSIS — H401112 Primary open-angle glaucoma, right eye, moderate stage: Secondary | ICD-10-CM | POA: Diagnosis not present

## 2018-04-25 DIAGNOSIS — H01003 Unspecified blepharitis right eye, unspecified eyelid: Secondary | ICD-10-CM | POA: Diagnosis not present

## 2018-04-25 DIAGNOSIS — H04129 Dry eye syndrome of unspecified lacrimal gland: Secondary | ICD-10-CM | POA: Diagnosis not present

## 2018-04-28 DIAGNOSIS — R9431 Abnormal electrocardiogram [ECG] [EKG]: Secondary | ICD-10-CM | POA: Diagnosis not present

## 2018-04-28 DIAGNOSIS — I440 Atrioventricular block, first degree: Secondary | ICD-10-CM | POA: Diagnosis not present

## 2018-04-28 DIAGNOSIS — I351 Nonrheumatic aortic (valve) insufficiency: Secondary | ICD-10-CM | POA: Diagnosis not present

## 2018-04-28 DIAGNOSIS — I2510 Atherosclerotic heart disease of native coronary artery without angina pectoris: Secondary | ICD-10-CM | POA: Diagnosis not present

## 2018-04-28 DIAGNOSIS — R0789 Other chest pain: Secondary | ICD-10-CM | POA: Diagnosis not present

## 2018-04-28 DIAGNOSIS — R079 Chest pain, unspecified: Secondary | ICD-10-CM | POA: Diagnosis not present

## 2018-04-28 DIAGNOSIS — I10 Essential (primary) hypertension: Secondary | ICD-10-CM | POA: Diagnosis not present

## 2018-05-09 DIAGNOSIS — I5020 Unspecified systolic (congestive) heart failure: Secondary | ICD-10-CM | POA: Diagnosis not present

## 2018-05-16 ENCOUNTER — Ambulatory Visit (INDEPENDENT_AMBULATORY_CARE_PROVIDER_SITE_OTHER): Payer: Medicare HMO | Admitting: Nurse Practitioner

## 2018-05-16 ENCOUNTER — Encounter: Payer: Self-pay | Admitting: Nurse Practitioner

## 2018-05-16 ENCOUNTER — Ambulatory Visit: Payer: Medicare HMO | Admitting: Nurse Practitioner

## 2018-05-16 VITALS — BP 136/50 | HR 98 | Temp 98.0°F | Ht 69.0 in | Wt 165.0 lb

## 2018-05-16 DIAGNOSIS — I2510 Atherosclerotic heart disease of native coronary artery without angina pectoris: Secondary | ICD-10-CM | POA: Diagnosis not present

## 2018-05-16 DIAGNOSIS — E119 Type 2 diabetes mellitus without complications: Secondary | ICD-10-CM

## 2018-05-16 DIAGNOSIS — I10 Essential (primary) hypertension: Secondary | ICD-10-CM

## 2018-05-16 DIAGNOSIS — E785 Hyperlipidemia, unspecified: Secondary | ICD-10-CM | POA: Diagnosis not present

## 2018-05-16 DIAGNOSIS — D649 Anemia, unspecified: Secondary | ICD-10-CM | POA: Diagnosis not present

## 2018-05-16 DIAGNOSIS — Z23 Encounter for immunization: Secondary | ICD-10-CM | POA: Diagnosis not present

## 2018-05-16 DIAGNOSIS — N400 Enlarged prostate without lower urinary tract symptoms: Secondary | ICD-10-CM

## 2018-05-16 MED ORDER — FINASTERIDE 5 MG PO TABS: 5.0000 mg | ORAL_TABLET | Freq: Every day | ORAL | 1 refills | Status: DC

## 2018-05-16 NOTE — Progress Notes (Signed)
Careteam: Patient Care Team: Sharon SellerEubanks, Jessica K, NP as PCP - General (Geriatric Medicine) Mateo FlowHecker, Kathryn, MD as Consulting Physician (Ophthalmology) Yates DecampGanji, Jay, MD as Consulting Physician (Cardiology)  Advanced Directive information Does Patient Have a Medical Advance Directive?: No  No Known Allergies  Chief Complaint  Patient presents with  . Medical Management of Chronic Issues    Pt is being seen for a routine visit.   . Other    Daughter in the room     HPI: Patient is a 84 y.o. male seen in the office today for routine follow up.  Has not been here since 06/22/2017. Reports he is under the heart doctors care so he does not need to come here.   Needs a refill on finasteride- no changes in urinary stream. May get up occasionally during the night.   Has a contusion to right eye, unsure what happened. Lives with daughter. Said he went to breakfast and the waitress noticed it.  Denies fall. Said he hit the rocking chair but really unsure of what happened.   Daughter reports he still drives but not far, will get his neighbor to bring him to places he does not feel comfortable.     CAD/HTN- without chest pains at this time. Had chest pains 3 weeks ago, EMS recommended going to the hospital but he did not wish to go. Stated "i'd rather die at home" EKG was normal but blood pressure was elevated.   Has healthcare POA and living will- daughter will bring in paperwokr  Review of Systems:  Review of Systems  Constitutional: Negative for chills, fever and weight loss.  HENT: Negative for tinnitus.   Respiratory: Negative for cough, sputum production and shortness of breath.   Cardiovascular: Negative for chest pain, palpitations and leg swelling.  Gastrointestinal: Negative for abdominal pain, constipation, diarrhea and heartburn.  Genitourinary: Negative for dysuria, frequency and urgency.  Musculoskeletal: Negative for back pain, falls, joint pain and myalgias.  Skin:  Negative.   Neurological: Negative for dizziness and headaches.  Psychiatric/Behavioral: Positive for memory loss. Negative for depression. The patient does not have insomnia.     Past Medical History:  Diagnosis Date  . Abnormality of gait   . Actinic keratosis   . Cognitive deficits, late effect of cerebrovascular disease   . Contact dermatitis and other eczema due to other specified agent   . Dizziness and giddiness   . Elevated prostate specific antigen (PSA)   . Essential and other specified forms of tremor   . First degree atrioventricular block   . Gastric ulcer, unspecified as acute or chronic, without mention of hemorrhage, perforation, or obstruction   . Hypertrophy of prostate without urinary obstruction and other lower urinary tract symptoms (LUTS)   . Impotence of organic origin   . Insomnia, unspecified   . Obesity, unspecified   . Other abnormal blood chemistry   . Pain in joint, lower leg   . STEMI (ST elevation myocardial infarction) (HCC) 05/07/2014  . Unspecified essential hypertension   . Unspecified glaucoma(365.9)   . Unspecified vitamin D deficiency   . Urge incontinence   . Urinary frequency    Past Surgical History:  Procedure Laterality Date  . APPENDECTOMY  1930's?  Marland Kitchen. CATARACT EXTRACTION W/ INTRAOCULAR LENS  IMPLANT, BILATERAL Bilateral   . CORONARY ANGIOPLASTY WITH STENT PLACEMENT  05/07/2014   "1"  . CYST EXCISION Left 1977   "knee"  . LEFT HEART CATHETERIZATION WITH CORONARY ANGIOGRAM N/A 05/07/2014  Procedure: LEFT HEART CATHETERIZATION WITH CORONARY ANGIOGRAM;  Surgeon: Pamella Pert, MD;  Location: [facility] CATH LAB;  Service: Cardiovascular;  Laterality: N/A;  . MULTIPLE TOOTH EXTRACTIONS    . TRANSURETHRAL RESECTION OF PROSTATE  1987   Social History:   reports that he has quit smoking. His smoking use included pipe. His smokeless tobacco use includes chew. He reports that he does not drink alcohol or use drugs.  Family History  Problem  Relation Age of Onset  . Stroke Mother   . Stroke Sister     Medications: Patient's Medications  New Prescriptions   No medications on file  Previous Medications   ASPIRIN 81 MG CHEWABLE TABLET    Chew 81 mg by mouth daily.   ATORVASTATIN (LIPITOR) 10 MG TABLET    Take 10 mg by mouth daily.   FINASTERIDE (PROSCAR) 5 MG TABLET    TAKE 1 TABLET BY MOUTH DAILY FOR PROSTATE   FUROSEMIDE (LASIX) 40 MG TABLET    Take 40 mg by mouth daily.   HYDRALAZINE (APRESOLINE) 50 MG TABLET    Take 50 mg by mouth every 8 (eight) hours.   ISOSORBIDE MONONITRATE (IMDUR) 60 MG 24 HR TABLET    Take 60 mg by mouth daily.   LATANOPROST (XALATAN) 0.005 % OPHTHALMIC SOLUTION    Place 1 drop into both eyes at bedtime.    NIFEDIPINE (ADALAT CC) 90 MG 24 HR TABLET    Take 90 mg by mouth daily.   NITROGLYCERIN (NITROSTAT) 0.4 MG SL TABLET    Place 1 tablet (0.4 mg total) under the tongue every 5 (five) minutes as needed for chest pain.   SPIRONOLACTONE (ALDACTONE) 25 MG TABLET    Take 25 mg by mouth daily.   TRIAMCINOLONE CREAM (KENALOG) 0.1 %    Apply sparingly up to twice daily to scaley areas on hands   VALSARTAN (DIOVAN) 320 MG TABLET    Take 320 mg daily by mouth.  Modified Medications   No medications on file  Discontinued Medications   METOPROLOL TARTRATE (LOPRESSOR) 50 MG TABLET    Take 50 mg daily by mouth.    NIFEDIPINE (PROCARDIA XL/ADALAT-CC) 60 MG 24 HR TABLET    Take 60 mg by mouth daily.   TIMOLOL (TIMOPTIC) 0.5 % OPHTHALMIC SOLUTION    Place 1 drop into both eyes daily.      Physical Exam:  Vitals:   05/16/18 1445  BP: (!) 136/50  Pulse: 98  Temp: 98 F (36.7 C)  TempSrc: Oral  SpO2: 96%  Weight: 165 lb (74.8 kg)  Height: 5\' 9"  (1.753 m)   Body mass index is 24.37 kg/m.  Physical Exam  Constitutional: He appears well-developed and well-nourished. No distress.  HENT:  Head: Normocephalic and atraumatic.  Mouth/Throat: Oropharynx is clear and moist. No oropharyngeal exudate.  Eyes:  Pupils are equal, round, and reactive to light. Conjunctivae and EOM are normal.  Neck: Normal range of motion. Neck supple.  Cardiovascular: Normal rate, regular rhythm and normal heart sounds.  Pulmonary/Chest: Effort normal and breath sounds normal.  Abdominal: Soft. Bowel sounds are normal.  Musculoskeletal: He exhibits no edema or tenderness.  Neurological: He is alert.  Skin: Skin is warm and dry. He is not diaphoretic.  Psychiatric: He has a normal mood and affect.    Labs reviewed: Basic Metabolic Panel: No results for input(s): NA, K, CL, CO2, GLUCOSE, BUN, CREATININE, CALCIUM, MG, PHOS, TSH in the last 8760 hours. Liver Function Tests: No results for  input(s): AST, ALT, ALKPHOS, BILITOT, PROT, ALBUMIN in the last 8760 hours. No results for input(s): LIPASE, AMYLASE in the last 8760 hours. No results for input(s): AMMONIA in the last 8760 hours. CBC: No results for input(s): WBC, NEUTROABS, HGB, HCT, MCV, PLT in the last 8760 hours. Lipid Panel: No results for input(s): CHOL, HDL, LDLCALC, TRIG, CHOLHDL, LDLDIRECT in the last 8760 hours. TSH: No results for input(s): TSH in the last 8760 hours. A1C: Lab Results  Component Value Date   HGBA1C 6.4 (H) 04/15/2016     Assessment/Plan 1. Coronary artery disease involving native coronary artery of native heart without angina pectoris Without chest pains, following with cardiology, will continue current regimen  - COMPLETE METABOLIC PANEL WITH GFR - CBC with Differential/Platelets  2. Essential hypertension -stable, will continue current regimen with dietary modifications.   3. Anemia, unspecified type - CBC with Differential/Platelets  4. Hyperlipidemia, unspecified hyperlipidemia type Continues on lipitor, cardiology following cholesterol, has follow up with Dr Jacinto Halim scheduled.   5. Type 2 diabetes mellitus without complication, without long-term current use of insulin (HCC) -diet controlled. Encouraged follow up  with ophthalmologist.  - Hemoglobin A1c  6. Benign prostatic hyperplasia without lower urinary tract symptoms -stable - finasteride (PROSCAR) 5 MG tablet; Take 1 tablet (5 mg total) by mouth daily.  Dispense: 90 tablet; Refill: 1  7. Need for vaccination with 13-polyvalent pneumococcal conjugate vaccine - Pneumococcal conjugate vaccine 13-valent IM  Next appt: 6 month follow up.  Janene Harvey. Biagio Borg  [facility] [phone]

## 2018-05-17 LAB — CBC WITH DIFFERENTIAL/PLATELET
BASOS ABS: 50 {cells}/uL (ref 0–200)
BASOS PCT: 0.8 %
EOS ABS: 252 {cells}/uL (ref 15–500)
Eosinophils Relative: 4 %
HCT: 38.3 % — ABNORMAL LOW (ref 38.5–50.0)
Hemoglobin: 12.8 g/dL — ABNORMAL LOW (ref 13.2–17.1)
Lymphs Abs: 1310 cells/uL (ref 850–3900)
MCH: 29.4 pg (ref 27.0–33.0)
MCHC: 33.4 g/dL (ref 32.0–36.0)
MCV: 88 fL (ref 80.0–100.0)
MPV: 10.4 fL (ref 7.5–12.5)
Monocytes Relative: 9.1 %
Neutro Abs: 4114 cells/uL (ref 1500–7800)
Neutrophils Relative %: 65.3 %
PLATELETS: 138 10*3/uL — AB (ref 140–400)
RBC: 4.35 10*6/uL (ref 4.20–5.80)
RDW: 13.7 % (ref 11.0–15.0)
TOTAL LYMPHOCYTE: 20.8 %
WBC: 6.3 10*3/uL (ref 3.8–10.8)
WBCMIX: 573 {cells}/uL (ref 200–950)

## 2018-05-17 LAB — HEMOGLOBIN A1C
EAG (MMOL/L): 7 (calc)
Hgb A1c MFr Bld: 6 % of total Hgb — ABNORMAL HIGH (ref <5.7)
Mean Plasma Glucose: 126 (calc)

## 2018-05-17 LAB — COMPLETE METABOLIC PANEL WITH GFR
AG Ratio: 2.1 (calc) (ref 1.0–2.5)
ALBUMIN MSPROF: 4.1 g/dL (ref 3.6–5.1)
ALT: 8 U/L — AB (ref 9–46)
AST: 11 U/L (ref 10–35)
Alkaline phosphatase (APISO): 61 U/L (ref 40–115)
BILIRUBIN TOTAL: 0.5 mg/dL (ref 0.2–1.2)
BUN / CREAT RATIO: 18 (calc) (ref 6–22)
BUN: 31 mg/dL — AB (ref 7–25)
CALCIUM: 9.4 mg/dL (ref 8.6–10.3)
CO2: 26 mmol/L (ref 20–32)
CREATININE: 1.76 mg/dL — AB (ref 0.70–1.11)
Chloride: 107 mmol/L (ref 98–110)
GFR, EST AFRICAN AMERICAN: 39 mL/min/{1.73_m2} — AB (ref >=60)
GFR, EST NON AFRICAN AMERICAN: 34 mL/min/{1.73_m2} — AB (ref >=60)
Globulin: 2 g/dL (calc) (ref 1.9–3.7)
Glucose, Bld: 182 mg/dL — ABNORMAL HIGH (ref 65–139)
Potassium: 4.5 mmol/L (ref 3.5–5.3)
Sodium: 141 mmol/L (ref 135–146)
TOTAL PROTEIN: 6.1 g/dL (ref 6.1–8.1)

## 2018-05-18 DIAGNOSIS — I10 Essential (primary) hypertension: Secondary | ICD-10-CM | POA: Diagnosis not present

## 2018-05-24 DIAGNOSIS — I2510 Atherosclerotic heart disease of native coronary artery without angina pectoris: Secondary | ICD-10-CM | POA: Diagnosis not present

## 2018-05-24 DIAGNOSIS — I440 Atrioventricular block, first degree: Secondary | ICD-10-CM | POA: Diagnosis not present

## 2018-05-24 DIAGNOSIS — I351 Nonrheumatic aortic (valve) insufficiency: Secondary | ICD-10-CM | POA: Diagnosis not present

## 2018-05-24 DIAGNOSIS — I10 Essential (primary) hypertension: Secondary | ICD-10-CM | POA: Diagnosis not present

## 2018-06-08 DIAGNOSIS — I5020 Unspecified systolic (congestive) heart failure: Secondary | ICD-10-CM | POA: Diagnosis not present

## 2018-06-08 DIAGNOSIS — I10 Essential (primary) hypertension: Secondary | ICD-10-CM | POA: Diagnosis not present

## 2018-07-09 DIAGNOSIS — I5020 Unspecified systolic (congestive) heart failure: Secondary | ICD-10-CM | POA: Diagnosis not present

## 2018-08-09 DIAGNOSIS — I5020 Unspecified systolic (congestive) heart failure: Secondary | ICD-10-CM | POA: Diagnosis not present

## 2018-09-07 DIAGNOSIS — H401123 Primary open-angle glaucoma, left eye, severe stage: Secondary | ICD-10-CM | POA: Diagnosis not present

## 2018-09-07 DIAGNOSIS — H353131 Nonexudative age-related macular degeneration, bilateral, early dry stage: Secondary | ICD-10-CM | POA: Diagnosis not present

## 2018-09-07 DIAGNOSIS — H35033 Hypertensive retinopathy, bilateral: Secondary | ICD-10-CM | POA: Diagnosis not present

## 2018-09-07 DIAGNOSIS — H401112 Primary open-angle glaucoma, right eye, moderate stage: Secondary | ICD-10-CM | POA: Diagnosis not present

## 2018-09-08 DIAGNOSIS — I5020 Unspecified systolic (congestive) heart failure: Secondary | ICD-10-CM | POA: Diagnosis not present

## 2018-09-29 DIAGNOSIS — I440 Atrioventricular block, first degree: Secondary | ICD-10-CM | POA: Diagnosis not present

## 2018-09-29 DIAGNOSIS — I351 Nonrheumatic aortic (valve) insufficiency: Secondary | ICD-10-CM | POA: Diagnosis not present

## 2018-09-29 DIAGNOSIS — I2510 Atherosclerotic heart disease of native coronary artery without angina pectoris: Secondary | ICD-10-CM | POA: Diagnosis not present

## 2018-09-29 DIAGNOSIS — I10 Essential (primary) hypertension: Secondary | ICD-10-CM | POA: Diagnosis not present

## 2018-10-09 DIAGNOSIS — I5020 Unspecified systolic (congestive) heart failure: Secondary | ICD-10-CM | POA: Diagnosis not present

## 2018-11-08 ENCOUNTER — Other Ambulatory Visit: Payer: Self-pay | Admitting: Nurse Practitioner

## 2018-11-08 DIAGNOSIS — I5020 Unspecified systolic (congestive) heart failure: Secondary | ICD-10-CM | POA: Diagnosis not present

## 2018-11-08 DIAGNOSIS — N400 Enlarged prostate without lower urinary tract symptoms: Secondary | ICD-10-CM

## 2018-11-18 ENCOUNTER — Ambulatory Visit: Payer: Medicare HMO | Admitting: Nurse Practitioner

## 2018-12-09 DIAGNOSIS — I5020 Unspecified systolic (congestive) heart failure: Secondary | ICD-10-CM | POA: Diagnosis not present

## 2019-01-06 ENCOUNTER — Other Ambulatory Visit: Payer: Self-pay | Admitting: Cardiology

## 2019-01-09 DIAGNOSIS — I5020 Unspecified systolic (congestive) heart failure: Secondary | ICD-10-CM | POA: Diagnosis not present

## 2019-01-20 DIAGNOSIS — H409 Unspecified glaucoma: Secondary | ICD-10-CM | POA: Diagnosis not present

## 2019-01-20 DIAGNOSIS — I252 Old myocardial infarction: Secondary | ICD-10-CM | POA: Diagnosis not present

## 2019-01-20 DIAGNOSIS — I2510 Atherosclerotic heart disease of native coronary artery without angina pectoris: Secondary | ICD-10-CM | POA: Diagnosis not present

## 2019-01-20 DIAGNOSIS — I110 Hypertensive heart disease with heart failure: Secondary | ICD-10-CM | POA: Diagnosis not present

## 2019-01-20 DIAGNOSIS — I509 Heart failure, unspecified: Secondary | ICD-10-CM | POA: Diagnosis not present

## 2019-01-20 DIAGNOSIS — N400 Enlarged prostate without lower urinary tract symptoms: Secondary | ICD-10-CM | POA: Diagnosis not present

## 2019-01-20 DIAGNOSIS — E785 Hyperlipidemia, unspecified: Secondary | ICD-10-CM | POA: Diagnosis not present

## 2019-01-20 DIAGNOSIS — N529 Male erectile dysfunction, unspecified: Secondary | ICD-10-CM | POA: Diagnosis not present

## 2019-01-20 DIAGNOSIS — G3184 Mild cognitive impairment, so stated: Secondary | ICD-10-CM | POA: Diagnosis not present

## 2019-01-20 DIAGNOSIS — H04129 Dry eye syndrome of unspecified lacrimal gland: Secondary | ICD-10-CM | POA: Diagnosis not present

## 2019-02-07 DIAGNOSIS — I5020 Unspecified systolic (congestive) heart failure: Secondary | ICD-10-CM | POA: Diagnosis not present

## 2019-03-10 DIAGNOSIS — I5020 Unspecified systolic (congestive) heart failure: Secondary | ICD-10-CM | POA: Diagnosis not present

## 2019-03-19 ENCOUNTER — Encounter: Payer: Self-pay | Admitting: Nurse Practitioner

## 2019-03-20 ENCOUNTER — Other Ambulatory Visit: Payer: Self-pay | Admitting: Cardiology

## 2019-03-20 MED ORDER — ATORVASTATIN CALCIUM 10 MG PO TABS: 10.0000 mg | ORAL_TABLET | Freq: Every day | ORAL | 3 refills | Status: DC

## 2019-03-23 ENCOUNTER — Other Ambulatory Visit: Payer: Self-pay | Admitting: Cardiology

## 2019-03-23 DIAGNOSIS — I10 Essential (primary) hypertension: Secondary | ICD-10-CM | POA: Diagnosis not present

## 2019-03-23 DIAGNOSIS — E7800 Pure hypercholesterolemia, unspecified: Secondary | ICD-10-CM | POA: Diagnosis not present

## 2019-03-24 LAB — COMPREHENSIVE METABOLIC PANEL
ALT: 9 IU/L (ref 0–44)
AST: 13 IU/L (ref 0–40)
Albumin/Globulin Ratio: 2.3 — ABNORMAL HIGH (ref 1.2–2.2)
Albumin: 4.5 g/dL (ref 3.6–4.6)
Alkaline Phosphatase: 77 IU/L (ref 39–117)
BUN/Creatinine Ratio: 15 (ref 10–24)
BUN: 16 mg/dL (ref 8–27)
Bilirubin Total: 0.6 mg/dL (ref 0.0–1.2)
CO2: 24 mmol/L (ref 20–29)
Calcium: 9.5 mg/dL (ref 8.6–10.2)
Chloride: 107 mmol/L — ABNORMAL HIGH (ref 96–106)
Creatinine, Ser: 1.09 mg/dL (ref 0.76–1.27)
GFR calc Af Amer: 70 mL/min/{1.73_m2} (ref >59)
GFR calc non Af Amer: 60 mL/min/{1.73_m2} (ref >59)
Globulin, Total: 2 g/dL (ref 1.5–4.5)
Glucose: 90 mg/dL (ref 65–99)
Potassium: 4.6 mmol/L (ref 3.5–5.2)
Sodium: 146 mmol/L — ABNORMAL HIGH (ref 134–144)
Total Protein: 6.5 g/dL (ref 6.0–8.5)

## 2019-03-24 LAB — CBC WITH DIFFERENTIAL/PLATELET
Basophils Absolute: 0.1 10*3/uL (ref 0.0–0.2)
Basos: 1 %
EOS (ABSOLUTE): 0.3 10*3/uL (ref 0.0–0.4)
Eos: 5 %
Hematocrit: 40 % (ref 37.5–51.0)
Hemoglobin: 13.2 g/dL (ref 13.0–17.7)
Immature Grans (Abs): 0 10*3/uL (ref 0.0–0.1)
Immature Granulocytes: 0 %
Lymphocytes Absolute: 1.9 10*3/uL (ref 0.7–3.1)
Lymphs: 27 %
MCH: 29.1 pg (ref 26.6–33.0)
MCHC: 33 g/dL (ref 31.5–35.7)
MCV: 88 fL (ref 79–97)
Monocytes Absolute: 0.7 10*3/uL (ref 0.1–0.9)
Monocytes: 10 %
Neutrophils Absolute: 3.9 10*3/uL (ref 1.4–7.0)
Neutrophils: 57 %
Platelets: 137 10*3/uL — ABNORMAL LOW (ref 150–450)
RBC: 4.53 x10E6/uL (ref 4.14–5.80)
RDW: 13.4 % (ref 11.6–15.4)
WBC: 6.8 10*3/uL (ref 3.4–10.8)

## 2019-03-24 LAB — LIPID PANEL W/O CHOL/HDL RATIO
Cholesterol, Total: 109 mg/dL (ref 100–199)
HDL: 55 mg/dL (ref >39)
LDL Calculated: 41 mg/dL (ref 0–99)
Triglycerides: 67 mg/dL (ref 0–149)
VLDL Cholesterol Cal: 13 mg/dL (ref 5–40)

## 2019-03-27 ENCOUNTER — Ambulatory Visit (INDEPENDENT_AMBULATORY_CARE_PROVIDER_SITE_OTHER): Payer: Medicare HMO | Admitting: Cardiology

## 2019-03-27 ENCOUNTER — Encounter: Payer: Self-pay | Admitting: Cardiology

## 2019-03-27 ENCOUNTER — Other Ambulatory Visit: Payer: Self-pay

## 2019-03-27 VITALS — BP 141/50 | HR 65 | Temp 98.6°F | Wt 172.0 lb

## 2019-03-27 DIAGNOSIS — R152 Fecal urgency: Secondary | ICD-10-CM

## 2019-03-27 DIAGNOSIS — I129 Hypertensive chronic kidney disease with stage 1 through stage 4 chronic kidney disease, or unspecified chronic kidney disease: Secondary | ICD-10-CM | POA: Diagnosis not present

## 2019-03-27 DIAGNOSIS — I351 Nonrheumatic aortic (valve) insufficiency: Secondary | ICD-10-CM | POA: Diagnosis not present

## 2019-03-27 DIAGNOSIS — N183 Chronic kidney disease, stage 3 unspecified: Secondary | ICD-10-CM

## 2019-03-27 DIAGNOSIS — R159 Full incontinence of feces: Secondary | ICD-10-CM | POA: Diagnosis not present

## 2019-03-27 DIAGNOSIS — I10 Essential (primary) hypertension: Secondary | ICD-10-CM

## 2019-03-27 DIAGNOSIS — I25118 Atherosclerotic heart disease of native coronary artery with other forms of angina pectoris: Secondary | ICD-10-CM

## 2019-03-27 NOTE — Progress Notes (Signed)
Primary Physician/Referring:  Sharon Seller, NP  Patient ID: John Mason, male    DOB: 29-Sep-1929, 85 y.o.   MRN: [id number]  Chief Complaint  Patient presents with  . Coronary Artery Disease  . Hypertension    AR  . Follow-up    7mo    HPI: John Mason  is a 85 y.o. male  with CAD status post anterior wall MI requiring PCI to LAD in 2015, ischemic cardiomyopathy with now improved EF to 55% in December 2018, moderate to severe aortic regurgitation, hypertension, hyperlipidemia, CKD. He does have orthostatic hypotension and is compliant with support stockings.  Patient is here on a 6 month office visit and follow-up for hypertension and discuss recent lab results. Daughter is present at bedside.  Symptomatically, he continues to do well with minimal complaints. He has not had to use any nitroglycerin. Reports some occasional difficulty with sleeping. He has issues with diarrhea and states that he often has accidents as he is not able to get to the bathroom fast enough. Daughter states that he is tired a lot. No recent falls or dizziness. Overall feels that he is doing well.   Past Medical History:  Diagnosis Date  . Abnormality of gait   . Actinic keratosis   . Cognitive deficits, late effect of cerebrovascular disease   . Contact dermatitis and other eczema due to other specified agent   . Dizziness and giddiness   . Elevated prostate specific antigen (PSA)   . Essential and other specified forms of tremor   . First degree atrioventricular block   . Gastric ulcer, unspecified as acute or chronic, without mention of hemorrhage, perforation, or obstruction   . Hypertrophy of prostate without urinary obstruction and other lower urinary tract symptoms (LUTS)   . Impotence of organic origin   . Insomnia, unspecified   . Obesity, unspecified   . Other abnormal blood chemistry   . Pain in joint, lower leg   . STEMI (ST elevation myocardial infarction) (HCC) 05/07/2014   . Unspecified essential hypertension   . Unspecified glaucoma(365.9)   . Unspecified vitamin D deficiency   . Urge incontinence   . Urinary frequency     Past Surgical History:  Procedure Laterality Date  . APPENDECTOMY  1930's?  Marland Kitchen CATARACT EXTRACTION W/ INTRAOCULAR LENS  IMPLANT, BILATERAL Bilateral   . CORONARY ANGIOPLASTY WITH STENT PLACEMENT  05/07/2014   "1"  . CYST EXCISION Left 1977   "knee"  . LEFT HEART CATHETERIZATION WITH CORONARY ANGIOGRAM N/A 05/07/2014   Procedure: LEFT HEART CATHETERIZATION WITH CORONARY ANGIOGRAM;  Surgeon: Pamella Pert, MD;  Location: [facility] CATH LAB;  Service: Cardiovascular;  Laterality: N/A;  . MULTIPLE TOOTH EXTRACTIONS    . TRANSURETHRAL RESECTION OF PROSTATE  1987    Social History   Socioeconomic History  . Marital status: Widowed    Spouse name: Not on file  . Number of children: 2  . Years of education: Not on file  . Highest education level: Not on file  Occupational History  . Not on file  Social Needs  . Financial resource strain: Not on file  . Food insecurity:    Worry: Not on file    Inability: Not on file  . Transportation needs:    Medical: Not on file    Non-medical: Not on file  Tobacco Use  . Smoking status: Former Smoker    Types: Pipe  . Smokeless tobacco: Current User    Types: Chew  Substance and Sexual Activity  . Alcohol use: No  . Drug use: No  . Sexual activity: Never  Lifestyle  . Physical activity:    Days per week: Not on file    Minutes per session: Not on file  . Stress: Not on file  Relationships  . Social connections:    Talks on phone: Not on file    Gets together: Not on file    Attends religious service: Not on file    Active member of club or organization: Not on file    Attends meetings of clubs or organizations: Not on file    Relationship status: Not on file  . Intimate partner violence:    Fear of current or ex partner: Not on file    Emotionally abused: Not on file     Physically abused: Not on file    Forced sexual activity: Not on file  Other Topics Concern  . Not on file  Social History Narrative  . Not on file    Current Outpatient Medications on File Prior to Visit  Medication Sig Dispense Refill  . aspirin 81 MG chewable tablet Chew 81 mg by mouth daily.    Marland Kitchen atorvastatin (LIPITOR) 10 MG tablet Take 1 tablet (10 mg total) by mouth daily. 90 tablet 3  . finasteride (PROSCAR) 5 MG tablet TAKE 1 TABLET BY MOUTH EVERY DAY 90 tablet 1  . furosemide (LASIX) 40 MG tablet Take 20 mg by mouth daily.     . hydrALAZINE (APRESOLINE) 50 MG tablet TAKE ONE TABLET BY MOUTH EVERY 8 HOURS 270 tablet 2  . isosorbide mononitrate (IMDUR) 60 MG 24 hr tablet Take 60 mg by mouth daily.    Marland Kitchen latanoprost (XALATAN) 0.005 % ophthalmic solution Place 1 drop into both eyes at bedtime.     Marland Kitchen NIFEdipine (ADALAT CC) 90 MG 24 hr tablet Take 90 mg by mouth daily.    . nitroGLYCERIN (NITROSTAT) 0.4 MG SL tablet Place 1 tablet (0.4 mg total) under the tongue every 5 (five) minutes as needed for chest pain. 30 tablet 12  . spironolactone (ALDACTONE) 25 MG tablet Take 12.5 mg by mouth daily.     Marland Kitchen triamcinolone cream (KENALOG) 0.1 % Apply sparingly up to twice daily to scaley areas on hands 30 g 4  . valsartan (DIOVAN) 320 MG tablet Take 320 mg daily by mouth.  2   No current facility-administered medications on file prior to visit.     Review of Systems  Constitution: Negative for decreased appetite, malaise/fatigue, weight gain and weight loss.  Eyes: Negative for visual disturbance.  Cardiovascular: Positive for chest pain (stable; relieved with nitroglycerin) and dyspnea on exertion. Negative for claudication, leg swelling, orthopnea, palpitations and syncope.  Respiratory: Negative for hemoptysis and wheezing.   Endocrine: Negative for cold intolerance and heat intolerance.  Hematologic/Lymphatic: Does not bruise/bleed easily.  Skin: Negative for nail changes.   Musculoskeletal: Negative for muscle weakness and myalgias.  Gastrointestinal: Positive for bowel incontinence and diarrhea. Negative for abdominal pain, change in bowel habit, nausea and vomiting.  Neurological: Negative for difficulty with concentration, dizziness, focal weakness and headaches.  Psychiatric/Behavioral: Negative for altered mental status and suicidal ideas.  All other systems reviewed and are negative.     Objective  Blood pressure (!) 141/50, pulse 65, temperature 98.6 F (37 C), weight 172 lb (78 kg), SpO2 98 %. Body mass index is 25.4 kg/m.    Physical Exam  Constitutional: He is oriented to person,  place, and time. Vital signs are normal. He appears well-developed and well-nourished.  HENT:  Head: Normocephalic and atraumatic.  Neck: Normal range of motion.  Cardiovascular: Normal rate, regular rhythm and intact distal pulses.  Murmur heard.  Blowing midsystolic murmur is present with a grade of 2/6 at the upper right sternal border and apex.  Low-pitched early diastolic murmur is present with a grade of 3/6 at the upper right sternal border and upper left sternal border. 2+ pitting edema on left leg  Pulmonary/Chest: Effort normal and breath sounds normal. No accessory muscle usage. No respiratory distress.  Abdominal: Soft. Bowel sounds are normal.  Musculoskeletal: Normal range of motion.  Neurological: He is alert and oriented to person, place, and time.  Skin: Skin is warm and dry.  Vitals reviewed.  Radiology: No results found.  Laboratory examination:    CMP Latest Ref Rng & Units 03/23/2019 05/16/2018 05/13/2017  Glucose 65 - 99 mg/dL 90 161(W182(H) 960(A118(H)  BUN 8 - 27 mg/dL 16 54(U31(H) 17  Creatinine 0.76 - 1.27 mg/dL 9.811.09 1.91(Y1.76(H) 7.82(N1.26(H)  Sodium 134 - 144 mmol/L 146(H) 141 138  Potassium 3.5 - 5.2 mmol/L 4.6 4.5 4.8  Chloride 96 - 106 mmol/L 107(H) 107 103  CO2 20 - 29 mmol/L 24 26 25   Calcium 8.6 - 10.2 mg/dL 9.5 9.4 9.2  Total Protein 6.0 - 8.5  g/dL 6.5 6.1 -  Total Bilirubin 0.0 - 1.2 mg/dL 0.6 0.5 -  Alkaline Phos 39 - 117 IU/L 77 - -  AST 0 - 40 IU/L 13 11 -  ALT 0 - 44 IU/L 9 8(L) -   CBC Latest Ref Rng & Units 03/23/2019 05/16/2018 05/13/2017  WBC 3.4 - 10.8 x10E3/uL 6.8 6.3 7.5  Hemoglobin 13.0 - 17.7 g/dL 56.213.2 12.8(L) 13.1(L)  Hematocrit 37.5 - 51.0 % 40.0 38.3(L) 39.8  Platelets 150 - 450 x10E3/uL 137(L) 138(L) 139(L)   Lipid Panel     Component Value Date/Time   CHOL 109 03/23/2019 0851   TRIG 67 03/23/2019 0851   HDL 55 03/23/2019 0851   CHOLHDL 2.1 08/26/2016 1340   VLDL 21 08/26/2016 1340   LDLCALC 41 03/23/2019 0851   HEMOGLOBIN A1C Lab Results  Component Value Date   HGBA1C 6.0 (H) 05/16/2018   MPG 126 05/16/2018   TSH No results for input(s): TSH in the last 8760 hours.  Cardiac Studies:   Echocardiogram 10/25/2017: Left ventricle cavity is normal in size. Normal global wall motion. Calculated EF 56%. Mild aortic valve leaflet calcification. Moderate (Grade III) regurgitation. Mild to moderate mitral regurgitation. Mild tricuspid regurgitation. PASP 30-35 mmHg. Compared to prior study in 10/2016, no significant change noted.   Coronary angiogram with Extensive Ant. wall MI- 05/07/14: Had Primary PCI to proximal and midsegment LAD with implantation of a 4.0 x 28 mm Xience Alpine DES. Other vessels, mild disease.  Assessment   Essential hypertension  Atherosclerosis of native coronary artery of native heart with stable angina pectoris (HCC)  Moderate aortic regurgitation - Plan: PCV ECHOCARDIOGRAM COMPLETE  CKD (chronic kidney disease) stage 3, GFR 30-59 ml/min (HCC)  Incontinence of feces with fecal urgency - Plan: Ambulatory referral to Gastroenterology   EKG 09/29/2018: Normal sinus rhythm at 61 bpm, first-degree AV block, normal axis, right bundle branch block. No evidence of ischemia. No significant change compared to EKG 04/28/2018  Recommendations:   Fairly active Caucasian male with  CAD status post anterior wall MI requiring PCI to LAD in 2015, ischemic cardiomyopathy with now improved EF  to 55% in December 2018, moderate to severe aortic regurgitation, hypertension, hyperlipidemia, CKD. He does have orthostatic hypotension and is compliant with support stockings.  Symptomatically and by physical exam, patient appears to be doing well.  As it has been 2 years since last echocardiogram, I recommended repeating this for surveillance of aortic regurgitation.  Has CAD with stable angina, has not recently had to use any nitroglycerin; however, has not been as active recently due to COVID.  We will continue to monitor.  He is on appropriate medical therapy.  Not on beta-blocker in view of first-degree AV block.  I reviewed recently obtained labs, kidney function and lipids are stable.  Continues to have chronic thrombocytopenia that is been stable.  Normal H&H.  Not on DAPT, continue only with ASA. We will follow-up with PCP regarding this.  No reported bleeding.    He does have complaints of diarrhea with bowel incontinence.  Do not suspect medication side effect.  Feel that it may be beneficial to have evaluation from GI to see if he has options in regards to managing this.  Will place referral to Dr. Clayborne Dana as he previously has underwent colonoscopy with him in the past.  Blood pressure is stable, no changes were made to medications today.  I will see him back in 6 months or sooner if problems.  We will notify them of echocardiogram results once available.   *I have discussed this case with Dr. Jacinto Halim and he personally examined the patient and participated in formulating the plan.*   Toniann Fail, MSN, APRN, FNP-C [facility] Office: [phone] Fax: [phone]

## 2019-04-09 DIAGNOSIS — I5020 Unspecified systolic (congestive) heart failure: Secondary | ICD-10-CM | POA: Diagnosis not present

## 2019-04-18 ENCOUNTER — Other Ambulatory Visit: Payer: Self-pay | Admitting: Cardiology

## 2019-04-18 MED ORDER — VALSARTAN 320 MG PO TABS: 320.0000 mg | ORAL_TABLET | Freq: Every day | ORAL | 3 refills | Status: DC

## 2019-04-21 ENCOUNTER — Other Ambulatory Visit: Payer: Self-pay

## 2019-04-27 ENCOUNTER — Other Ambulatory Visit: Payer: Medicare HMO

## 2019-04-28 ENCOUNTER — Encounter: Payer: Self-pay | Admitting: Internal Medicine

## 2019-05-05 ENCOUNTER — Other Ambulatory Visit: Payer: Self-pay | Admitting: Nurse Practitioner

## 2019-05-05 DIAGNOSIS — N400 Enlarged prostate without lower urinary tract symptoms: Secondary | ICD-10-CM

## 2019-05-10 DIAGNOSIS — I5020 Unspecified systolic (congestive) heart failure: Secondary | ICD-10-CM | POA: Diagnosis not present

## 2019-05-12 ENCOUNTER — Other Ambulatory Visit: Payer: Self-pay | Admitting: Cardiology

## 2019-05-12 NOTE — Telephone Encounter (Signed)
Is this ok to fill? 

## 2019-05-15 ENCOUNTER — Ambulatory Visit: Payer: Medicare HMO | Admitting: Internal Medicine

## 2019-05-18 ENCOUNTER — Other Ambulatory Visit: Payer: Self-pay | Admitting: Cardiology

## 2019-05-18 DIAGNOSIS — I10 Essential (primary) hypertension: Secondary | ICD-10-CM

## 2019-05-26 DIAGNOSIS — H401123 Primary open-angle glaucoma, left eye, severe stage: Secondary | ICD-10-CM | POA: Diagnosis not present

## 2019-05-26 DIAGNOSIS — H401112 Primary open-angle glaucoma, right eye, moderate stage: Secondary | ICD-10-CM | POA: Diagnosis not present

## 2019-05-28 ENCOUNTER — Other Ambulatory Visit: Payer: Self-pay | Admitting: Nurse Practitioner

## 2019-05-28 ENCOUNTER — Other Ambulatory Visit: Payer: Self-pay | Admitting: Cardiology

## 2019-05-28 DIAGNOSIS — I2510 Atherosclerotic heart disease of native coronary artery without angina pectoris: Secondary | ICD-10-CM

## 2019-05-28 DIAGNOSIS — N400 Enlarged prostate without lower urinary tract symptoms: Secondary | ICD-10-CM

## 2019-05-29 ENCOUNTER — Ambulatory Visit (INDEPENDENT_AMBULATORY_CARE_PROVIDER_SITE_OTHER): Payer: Medicare HMO

## 2019-05-29 ENCOUNTER — Other Ambulatory Visit: Payer: Self-pay

## 2019-05-29 DIAGNOSIS — I351 Nonrheumatic aortic (valve) insufficiency: Secondary | ICD-10-CM

## 2019-05-31 NOTE — Progress Notes (Signed)
Unable to reach daughter.

## 2019-06-09 DIAGNOSIS — I5020 Unspecified systolic (congestive) heart failure: Secondary | ICD-10-CM | POA: Diagnosis not present

## 2019-07-10 DIAGNOSIS — I5020 Unspecified systolic (congestive) heart failure: Secondary | ICD-10-CM | POA: Diagnosis not present

## 2019-07-21 ENCOUNTER — Other Ambulatory Visit: Payer: Self-pay

## 2019-07-21 DIAGNOSIS — I2510 Atherosclerotic heart disease of native coronary artery without angina pectoris: Secondary | ICD-10-CM

## 2019-07-21 DIAGNOSIS — I10 Essential (primary) hypertension: Secondary | ICD-10-CM

## 2019-07-21 MED ORDER — NIFEDIPINE ER 90 MG PO TB24: 90.0000 mg | ORAL_TABLET | Freq: Every day | ORAL | 3 refills | Status: DC

## 2019-07-21 MED ORDER — SPIRONOLACTONE 25 MG PO TABS: 12.5000 mg | ORAL_TABLET | Freq: Every day | ORAL | 3 refills | Status: DC

## 2019-07-26 ENCOUNTER — Other Ambulatory Visit: Payer: Self-pay

## 2019-08-07 DIAGNOSIS — R69 Illness, unspecified: Secondary | ICD-10-CM | POA: Diagnosis not present

## 2019-08-10 DIAGNOSIS — I5020 Unspecified systolic (congestive) heart failure: Secondary | ICD-10-CM | POA: Diagnosis not present

## 2019-08-30 ENCOUNTER — Telehealth: Payer: Self-pay

## 2019-08-30 NOTE — Telephone Encounter (Signed)
Any over the counter medication is fine especially just for a few days. There is a OTC medication that is for patient with hypertension

## 2019-08-30 NOTE — Telephone Encounter (Signed)
Pt's daughter called and said pt has a dry cough and wanted to know what she can give him.

## 2019-08-30 NOTE — Telephone Encounter (Signed)
S/w pt's daughter advised her of AK recommendations

## 2019-09-06 DIAGNOSIS — H35033 Hypertensive retinopathy, bilateral: Secondary | ICD-10-CM | POA: Diagnosis not present

## 2019-09-06 DIAGNOSIS — H401123 Primary open-angle glaucoma, left eye, severe stage: Secondary | ICD-10-CM | POA: Diagnosis not present

## 2019-09-06 DIAGNOSIS — H353131 Nonexudative age-related macular degeneration, bilateral, early dry stage: Secondary | ICD-10-CM | POA: Diagnosis not present

## 2019-09-06 DIAGNOSIS — H401112 Primary open-angle glaucoma, right eye, moderate stage: Secondary | ICD-10-CM | POA: Diagnosis not present

## 2019-09-09 DIAGNOSIS — I5020 Unspecified systolic (congestive) heart failure: Secondary | ICD-10-CM | POA: Diagnosis not present

## 2019-09-15 DIAGNOSIS — H401112 Primary open-angle glaucoma, right eye, moderate stage: Secondary | ICD-10-CM | POA: Diagnosis not present

## 2019-09-28 DIAGNOSIS — H401123 Primary open-angle glaucoma, left eye, severe stage: Secondary | ICD-10-CM | POA: Diagnosis not present

## 2019-10-02 ENCOUNTER — Other Ambulatory Visit: Payer: Self-pay

## 2019-10-02 ENCOUNTER — Encounter: Payer: Self-pay | Admitting: Cardiology

## 2019-10-02 ENCOUNTER — Ambulatory Visit: Payer: Medicare HMO | Admitting: Cardiology

## 2019-10-02 VITALS — BP 120/46 | HR 65 | Temp 97.2°F | Ht 69.0 in | Wt 172.0 lb

## 2019-10-02 DIAGNOSIS — R152 Fecal urgency: Secondary | ICD-10-CM

## 2019-10-02 DIAGNOSIS — I351 Nonrheumatic aortic (valve) insufficiency: Secondary | ICD-10-CM

## 2019-10-02 DIAGNOSIS — R159 Full incontinence of feces: Secondary | ICD-10-CM

## 2019-10-02 DIAGNOSIS — I25118 Atherosclerotic heart disease of native coronary artery with other forms of angina pectoris: Secondary | ICD-10-CM | POA: Diagnosis not present

## 2019-10-02 DIAGNOSIS — D693 Immune thrombocytopenic purpura: Secondary | ICD-10-CM

## 2019-10-02 DIAGNOSIS — I10 Essential (primary) hypertension: Secondary | ICD-10-CM | POA: Diagnosis not present

## 2019-10-02 NOTE — Progress Notes (Signed)
Primary Physician/Referring:  Sharon SellerEubanks, Jessica K, NP  Patient ID: John Mason, male    DOB: 1929/11/01, 85 y.o.   MRN: [id number]  Chief Complaint  Patient presents with   Hypertension   Follow-up    6 week    HPI: John Pulwood R Culp  is a 85 y.o. male  with CAD status post anterior wall MI requiring PCI to LAD in 2015, moderate to severe aortic regurgitation, hypertension, hyperlipidemia, and CKD. Recently found to have mildly depressed LVEF at 45% by echo in July 2020. He does have orthostatic hypotension and is compliant with support stockings.  Patient is here on a 6 month office visit and follow-up for hypertension and discuss recent lab results. Daughter is present at bedside.  He is presently doing well without any complaints today.  He has not had any worsening shortness of breath.  Daughter reports he has had to use nitroglycerin 1 or 2 times since last seen by us, but not recently.  Leg edema remains stable with use of support stockings.  He does continue to have difficulty with sleeping and also issues with diarrhea and incontinence.  He was referred to GI at his last visit, but decided not to go.   Past Medical History:  Diagnosis Date   Abnormality of gait    Actinic keratosis    Cognitive deficits, late effect of cerebrovascular disease    Contact dermatitis and other eczema due to other specified agent    Dizziness and giddiness    Elevated prostate specific antigen (PSA)    Essential and other specified forms of tremor    First degree atrioventricular block    Gastric ulcer, unspecified as acute or chronic, without mention of hemorrhage, perforation, or obstruction    Hypertrophy of prostate without urinary obstruction and other lower urinary tract symptoms (LUTS)    Impotence of organic origin    Insomnia, unspecified    Obesity, unspecified    Other abnormal blood chemistry    Pain in joint, lower leg    STEMI (ST elevation myocardial  infarction) (HCC) 05/07/2014   Unspecified essential hypertension    Unspecified glaucoma(365.9)    Unspecified vitamin D deficiency    Urge incontinence    Urinary frequency     Past Surgical History:  Procedure Laterality Date   APPENDECTOMY  1930's?   CATARACT EXTRACTION W/ INTRAOCULAR LENS  IMPLANT, BILATERAL Bilateral    CORONARY ANGIOPLASTY WITH STENT PLACEMENT  05/07/2014   "1"   CYST EXCISION Left 1977   "knee"   LEFT HEART CATHETERIZATION WITH CORONARY ANGIOGRAM N/A 05/07/2014   Procedure: LEFT HEART CATHETERIZATION WITH CORONARY ANGIOGRAM;  Surgeon: Pamella PertJagadeesh R Ganji, MD;  [facility];  Service: Cardiovascular;  Laterality: N/A;   MULTIPLE TOOTH EXTRACTIONS     TRANSURETHRAL RESECTION OF PROSTATE  1987    Social History   Socioeconomic History   Marital status: Widowed    Spouse name: Not on file   Number of children: 2   Years of education: Not on file   Highest education level: Not on file  Occupational History   Not on file  Social Needs   Financial resource strain: Not on file   Food insecurity    Worry: Not on file    Inability: Not on file   Transportation needs    Medical: Not on file    Non-medical: Not on file  Tobacco Use   Smoking status: Former Smoker   Smokeless tobacco: Current User  Types: Chew  Substance and Sexual Activity   Alcohol use: No   Drug use: No   Sexual activity: Never  Lifestyle   Physical activity    Days per week: Not on file    Minutes per session: Not on file   Stress: Not on file  Relationships   Social connections    Talks on phone: Not on file    Gets together: Not on file    Attends religious service: Not on file    Active member of club or organization: Not on file    Attends meetings of clubs or organizations: Not on file    Relationship status: Not on file   Intimate partner violence    Fear of current or ex partner: Not on file    Emotionally abused: Not on file     Physically abused: Not on file    Forced sexual activity: Not on file  Other Topics Concern   Not on file  Social History Narrative   Son passed away    Current Outpatient Medications on File Prior to Visit  Medication Sig Dispense Refill   aspirin 81 MG chewable tablet Chew 81 mg by mouth daily.     atorvastatin (LIPITOR) 10 MG tablet Take 1 tablet (10 mg total) by mouth daily. 90 tablet 3   finasteride (PROSCAR) 5 MG tablet TAKE 1 TABLET BY MOUTH DAILY 30 tablet 5   furosemide (LASIX) 20 MG tablet TAKE 1 TABLET DAILY IN THE MORNING 90 tablet 1   hydrALAZINE (APRESOLINE) 50 MG tablet TAKE ONE TABLET BY MOUTH EVERY 8 HOURS 270 tablet 2   isosorbide mononitrate (IMDUR) 60 MG 24 hr tablet Take 60 mg by mouth daily.     latanoprost (XALATAN) 0.005 % ophthalmic solution Place 1 drop into both eyes at bedtime.      NIFEdipine (PROCARDIA XL/NIFEDICAL-XL) 90 MG 24 hr tablet TAKE 1 TABLET BY MOUTH EVERY DAY 90 tablet 1   nitroGLYCERIN (NITROSTAT) 0.4 MG SL tablet Place 1 tablet (0.4 mg total) under the tongue every 5 (five) minutes as needed for chest pain. 30 tablet 12   spironolactone (ALDACTONE) 25 MG tablet Take 0.5 tablets (12.5 mg total) by mouth daily. 90 tablet 3   triamcinolone cream (KENALOG) 0.1 % Apply sparingly up to twice daily to scaley areas on hands 30 g 4   valsartan (DIOVAN) 320 MG tablet Take 1 tablet (320 mg total) by mouth daily. 90 tablet 3   No current facility-administered medications on file prior to visit.     Review of Systems  Constitution: Negative for decreased appetite, malaise/fatigue, weight gain and weight loss.  Eyes: Negative for visual disturbance.  Cardiovascular: Positive for chest pain (stable; relieved with nitroglycerin) and dyspnea on exertion. Negative for claudication, leg swelling, orthopnea, palpitations and syncope.  Respiratory: Negative for hemoptysis and wheezing.   Endocrine: Negative for cold intolerance and heat intolerance.    Hematologic/Lymphatic: Does not bruise/bleed easily.  Skin: Negative for nail changes.  Musculoskeletal: Negative for muscle weakness and myalgias.  Gastrointestinal: Positive for bowel incontinence and diarrhea. Negative for abdominal pain, change in bowel habit, nausea and vomiting.  Neurological: Negative for difficulty with concentration, dizziness, focal weakness and headaches.  Psychiatric/Behavioral: Negative for altered mental status and suicidal ideas.  All other systems reviewed and are negative.     Objective  Blood pressure (!) 120/46, pulse 65, temperature (!) 97.2 F (36.2 C), height 5\' 9"  (1.753 m), weight 172 lb (78 kg), SpO2 97 %.  Body mass index is 25.4 kg/m.   Physical Exam  Constitutional: He is oriented to person, place, and time. Vital signs are normal. He appears well-developed and well-nourished.  HENT:  Head: Normocephalic and atraumatic.  Neck: Normal range of motion.  Cardiovascular: Normal rate, regular rhythm and intact distal pulses.  Murmur heard.  Blowing midsystolic murmur is present with a grade of 2/6 at the upper right sternal border and apex.  Low-pitched early diastolic murmur is present with a grade of 3/6 at the upper right sternal border and upper left sternal border. 2+ pitting edema on left leg  Pulmonary/Chest: Effort normal and breath sounds normal. No accessory muscle usage. No respiratory distress.  Abdominal: Soft. Bowel sounds are normal.  Musculoskeletal: Normal range of motion.  Neurological: He is alert and oriented to person, place, and time.  Skin: Skin is warm and dry.  Vitals reviewed.  Radiology: No results found.  Laboratory examination:    CMP Latest Ref Rng & Units 03/23/2019 05/16/2018 05/13/2017  Glucose 65 - 99 mg/dL 90 474(Q) 595(G)  BUN 8 - 27 mg/dL 16 38(V) 17  Creatinine 0.76 - 1.27 mg/dL 5.64 3.32(R) 5.18(A)  Sodium 134 - 144 mmol/L 146(H) 141 138  Potassium 3.5 - 5.2 mmol/L 4.6 4.5 4.8  Chloride 96 - 106  mmol/L 107(H) 107 103  CO2 20 - 29 mmol/L 24 26 25   Calcium 8.6 - 10.2 mg/dL 9.5 9.4 9.2  Total Protein 6.0 - 8.5 g/dL 6.5 6.1 -  Total Bilirubin 0.0 - 1.2 mg/dL 0.6 0.5 -  Alkaline Phos 39 - 117 IU/L 77 - -  AST 0 - 40 IU/L 13 11 -  ALT 0 - 44 IU/L 9 8(L) -   CBC Latest Ref Rng & Units 03/23/2019 05/16/2018 05/13/2017  WBC 3.4 - 10.8 x10E3/uL 6.8 6.3 7.5  Hemoglobin 13.0 - 17.7 g/dL 05/15/2017 12.8(L) 13.1(L)  Hematocrit 37.5 - 51.0 % 40.0 38.3(L) 39.8  Platelets 150 - 450 x10E3/uL 137(L) 138(L) 139(L)   Lipid Panel     Component Value Date/Time   CHOL 109 03/23/2019 0851   TRIG 67 03/23/2019 0851   HDL 55 03/23/2019 0851   CHOLHDL 2.1 08/26/2016 1340   VLDL 21 08/26/2016 1340   LDLCALC 41 03/23/2019 0851   HEMOGLOBIN A1C Lab Results  Component Value Date   HGBA1C 6.0 (H) 05/16/2018   MPG 126 05/16/2018   TSH No results for input(s): TSH in the last 8760 hours.  Cardiac Studies:   Echocardiogram 05/29/2019: Mildly depressed LV systolic function with EF 45%. Left ventricle cavity is normal in size. Moderate concentric hypertrophy of the left ventricle. Normal global wall motion.  Left atrial cavity is mildly dilated. Trileaflet aortic valve with central coaptation defect. Moderate (Grade III) aortic regurgitation. Moderate (Grade II) mitral regurgitation. Mild to moderate tricuspid regurgitation. Estimated pulmonary artery systolic pressure is 29 mmHg. Compared to previous study in 10/25/2017, LVEF is mildly depressed. No other significant change noted.    Coronary angiogram with Extensive Ant. wall MI- 05/07/14: Had Primary PCI to proximal and midsegment LAD with implantation of a 4.0 x 28 mm Xience Alpine DES. Other vessels, mild disease.  Assessment   Atherosclerosis of native coronary artery of native heart with stable angina pectoris (HCC) - Plan: CBC, Comprehensive Metabolic Panel (CMET), Lipid Profile  Essential hypertension - Plan: EKG 12-Lead  Moderate aortic  regurgitation  Incontinence of feces with fecal urgency  Chronic idiopathic thrombocytopenia (HCC)   EKG 09/29/2018: Normal sinus rhythm at 61  bpm, first-degree AV block, normal axis, right bundle branch block. No evidence of ischemia. No significant change compared to EKG 04/28/2018  Recommendations:   Fairly active Caucasian male with CAD status post anterior wall MI requiring PCI to LAD in 2015, ischemic cardiomyopathy with now improved EF to 55% in December 2018, moderate to severe aortic regurgitation, hypertension, hyperlipidemia, CKD. He does have orthostatic hypotension and is compliant with support stockings.  I discussed echocardiogram that was performed in July, previously had normal LVEF which is now mildly depressed at 45%.  Valvular disease has remained stable.  In view of his age we will continue with conservative measures and medical management.  Daughter is in agreements to this.  No clinical evidence of decompensated heart failure.  Leg edema is chronic and has been stable with use of support stockings.  He has stable angina that is easily relieved with nitroglycerin.  He is still able to do some things in his yard.  His biggest complaint is difficulty sleeping, in which I have encouraged him to try over-the-counter melatonin.  He does continue to have significant issues with diarrhea and fecal incontinence, he is now willing to be evaluated by GI and will repeat referral to Dr. Clayborne Dana as he has previously had colonoscopy with him.  He has not had any recent labs, will obtain CBC, CMP and lipids.  Has chronic thrombocytopenia that has been stable.  He is only on aspirin therapy.  Overall, feel that patient is doing well.  I will see him back in 6 months or sooner if needed.   Toniann Fail, MSN, APRN, FNP-C [facility] Office: [phone] Fax: [phone]

## 2019-10-10 DIAGNOSIS — I5020 Unspecified systolic (congestive) heart failure: Secondary | ICD-10-CM | POA: Diagnosis not present

## 2019-11-09 ENCOUNTER — Other Ambulatory Visit: Payer: Self-pay

## 2019-11-09 DIAGNOSIS — I10 Essential (primary) hypertension: Secondary | ICD-10-CM

## 2019-11-09 DIAGNOSIS — I5020 Unspecified systolic (congestive) heart failure: Secondary | ICD-10-CM | POA: Diagnosis not present

## 2019-11-09 DIAGNOSIS — I25118 Atherosclerotic heart disease of native coronary artery with other forms of angina pectoris: Secondary | ICD-10-CM

## 2019-11-09 MED ORDER — FUROSEMIDE 20 MG PO TABS: 20.0000 mg | ORAL_TABLET | Freq: Every morning | ORAL | 1 refills | Status: DC

## 2019-11-09 MED ORDER — HYDRALAZINE HCL 50 MG PO TABS: 50.0000 mg | ORAL_TABLET | Freq: Three times a day (TID) | ORAL | 2 refills | Status: DC

## 2019-12-04 ENCOUNTER — Telehealth (INDEPENDENT_AMBULATORY_CARE_PROVIDER_SITE_OTHER): Payer: Medicare HMO | Admitting: Nurse Practitioner

## 2019-12-04 ENCOUNTER — Ambulatory Visit: Payer: Self-pay | Attending: Internal Medicine

## 2019-12-04 ENCOUNTER — Encounter: Payer: Self-pay | Admitting: Nurse Practitioner

## 2019-12-04 ENCOUNTER — Other Ambulatory Visit: Payer: Self-pay | Admitting: Nurse Practitioner

## 2019-12-04 ENCOUNTER — Other Ambulatory Visit: Payer: Self-pay

## 2019-12-04 DIAGNOSIS — R05 Cough: Secondary | ICD-10-CM | POA: Diagnosis not present

## 2019-12-04 DIAGNOSIS — N400 Enlarged prostate without lower urinary tract symptoms: Secondary | ICD-10-CM

## 2019-12-04 DIAGNOSIS — R059 Cough, unspecified: Secondary | ICD-10-CM

## 2019-12-04 DIAGNOSIS — E119 Type 2 diabetes mellitus without complications: Secondary | ICD-10-CM

## 2019-12-04 DIAGNOSIS — R110 Nausea: Secondary | ICD-10-CM | POA: Diagnosis not present

## 2019-12-04 DIAGNOSIS — R197 Diarrhea, unspecified: Secondary | ICD-10-CM

## 2019-12-04 NOTE — Patient Instructions (Signed)
Bland diet, advance as tolerates  To add probiotic twice daily  To add fiber into diet- can use benefiber which is OTC  To see if eating proper diet helps with nausea. Are there any trends with the nausea  For fever, vomiting, abdominal pain and unable to keep fluids down need to seek emergency medical attention   To obtain stool sample for testing- kit has been placed at the front for pick up  Once you get tested for COVID and results come back we can schedule lab appt but if symptoms worsen or For fever, vomiting, abdominal pain and unable to keep fluids down need to seek emergency medical attention at urgent care or ED

## 2019-12-04 NOTE — Progress Notes (Signed)
This service is provided via telemedicine  No vital signs collected/recorded due to the encounter was a telemedicine visit.   Location of patient (ex: home, work):  Home  Patient consents to a telephone visit:  Yes Location of the provider (ex: office, home):  [employer]   Name of any referring provider:  N/A  Names of all persons participating in the telemedicine service and their role in the encounter:  Patient, Daughter Jarome Lamas, [place], Sherrie Mustache, NP.    Time spent on call:  8 minutes spent with Medical Assistant.     Careteam: Patient Care Team: Lauree Chandler, NP as PCP - General (Geriatric Medicine) Monna Fam, MD as Consulting Physician (Ophthalmology) Adrian Prows, MD as Consulting Physician (Cardiology)  Advanced Directive information    No Known Allergies  Chief Complaint  Patient presents with  . Acute Visit    Nausea/Diarrhea x 2-3 months /Loss of Appetite x 1 week  . Health Maintenance    Discuss the need for Foot Exam, Ophthalmology, Hemoglobin A1C     HPI: Patient is a 86 y.o. male via virtual visit due to Nausea off and off for the last week and diarrhea for several months. Mentioned it go cardiologist and they recommended GI. Does not eat well. Goes to a restaurant occasionally with his friend. Chew tobacco. Does not have teeth.  Daughter will cook him some food but he just nibbles.   Nausea started over the last week. Daughter reports yesterday he was nauseated all day and somewhat nauseated the day before  His daughter reports he has had diarrhea up to twice daily.  Due to his diarrhea he has had incontinence of his bowels.  His appetite is poor.  No abdominal pain.  No fever that she is aware of.  Coughing a lot. Has been going on for about a month.   Wears a Mask when he goes out.  No known exposure to COVID.   Does not sleep good at night.  He admits to napping during the day.    Review of  Systems:  Review of Systems  Constitutional: Positive for malaise/fatigue and weight loss. Negative for chills and fever.  Respiratory: Positive for cough. Negative for sputum production and shortness of breath.   Cardiovascular: Positive for leg swelling (one leg swells more than other- chronic). Negative for chest pain and palpitations.  Gastrointestinal: Positive for diarrhea and nausea. Negative for abdominal pain, constipation, heartburn and vomiting.  Psychiatric/Behavioral: Positive for memory loss.   Past Medical History:  Diagnosis Date  . Abnormality of gait   . Actinic keratosis   . Cognitive deficits, late effect of cerebrovascular disease   . Contact dermatitis and other eczema due to other specified agent   . Dizziness and giddiness   . Elevated prostate specific antigen (PSA)   . Essential and other specified forms of tremor   . First degree atrioventricular block   . Gastric ulcer, unspecified as acute or chronic, without mention of hemorrhage, perforation, or obstruction   . Hypertrophy of prostate without urinary obstruction and other lower urinary tract symptoms (LUTS)   . Impotence of organic origin   . Insomnia, unspecified   . Obesity, unspecified   . Other abnormal blood chemistry   . Pain in joint, lower leg   . STEMI (ST elevation myocardial infarction) ([place]) 05/07/2014  . Unspecified essential hypertension   . Unspecified glaucoma(365.9)   . Unspecified vitamin D deficiency   .  Urge incontinence   . Urinary frequency    Past Surgical History:  Procedure Laterality Date  . APPENDECTOMY  1930's?  Marland Kitchen CATARACT EXTRACTION W/ INTRAOCULAR LENS  IMPLANT, BILATERAL Bilateral   . CORONARY ANGIOPLASTY WITH STENT PLACEMENT  05/07/2014   "1"  . CYST EXCISION Left 1977   "knee"  . LEFT HEART CATHETERIZATION WITH CORONARY ANGIOGRAM N/A 05/07/2014   Procedure: LEFT HEART CATHETERIZATION WITH CORONARY ANGIOGRAM;  Surgeon: Laverda Page, MD;  Location: [facility] CATH LAB;   Service: Cardiovascular;  Laterality: N/A;  . MULTIPLE TOOTH EXTRACTIONS    . TRANSURETHRAL RESECTION OF PROSTATE  1987   Social History:   reports that he has quit smoking. His smokeless tobacco use includes chew. He reports that he does not drink alcohol or use drugs.  Family History  Problem Relation Age of Onset  . Stroke Mother   . Stroke Sister     Medications: Patient's Medications  New Prescriptions   No medications on file  Previous Medications   ASPIRIN 81 MG CHEWABLE TABLET    Chew 81 mg by mouth daily.   ATORVASTATIN (LIPITOR) 10 MG TABLET    Take 1 tablet (10 mg total) by mouth daily.   FINASTERIDE (PROSCAR) 5 MG TABLET    TAKE 1 TABLET BY MOUTH DAILY   FUROSEMIDE (LASIX) 20 MG TABLET    Take 1 tablet (20 mg total) by mouth every morning.   HYDRALAZINE (APRESOLINE) 50 MG TABLET    Take 1 tablet (50 mg total) by mouth every 8 (eight) hours.   ISOSORBIDE MONONITRATE (IMDUR) 60 MG 24 HR TABLET    Take 60 mg by mouth daily.   LATANOPROST (XALATAN) 0.005 % OPHTHALMIC SOLUTION    Place 1 drop into both eyes at bedtime.    NIFEDIPINE (PROCARDIA XL/NIFEDICAL-XL) 90 MG 24 HR TABLET    TAKE 1 TABLET BY MOUTH EVERY DAY   NITROGLYCERIN (NITROSTAT) 0.4 MG SL TABLET    Place 1 tablet (0.4 mg total) under the tongue every 5 (five) minutes as needed for chest pain.   SPIRONOLACTONE (ALDACTONE) 25 MG TABLET    Take 0.5 tablets (12.5 mg total) by mouth daily.   TRIAMCINOLONE CREAM (KENALOG) 0.1 %    Apply sparingly up to twice daily to scaley areas on hands   VALSARTAN (DIOVAN) 320 MG TABLET    Take 1 tablet (320 mg total) by mouth daily.  Modified Medications   No medications on file  Discontinued Medications   No medications on file    Physical Exam:  There were no vitals filed for this visit. There is no height or weight on file to calculate BMI. Wt Readings from Last 3 Encounters:  10/02/19 172 lb (78 kg)  03/27/19 172 lb (78 kg)  05/16/18 165 lb (74.8 kg)    Physical  Exam  Labs reviewed: Basic Metabolic Panel: Recent Labs    03/23/19 0851  NA 146*  K 4.6  CL 107*  CO2 24  GLUCOSE 90  BUN 16  CREATININE 1.09  CALCIUM 9.5   Liver Function Tests: Recent Labs    03/23/19 0851  AST 13  ALT 9  ALKPHOS 77  BILITOT 0.6  PROT 6.5  ALBUMIN 4.5   No results for input(s): LIPASE, AMYLASE in the last 8760 hours. No results for input(s): AMMONIA in the last 8760 hours. CBC: Recent Labs    03/23/19 0851  WBC 6.8  NEUTROABS 3.9  HGB 13.2  HCT 40.0  MCV 88  PLT 137*   Lipid Panel: Recent Labs    03/23/19 0851  CHOL 109  HDL 55  LDLCALC 41  TRIG 67   TSH: No results for input(s): TSH in the last 8760 hours. A1C: Lab Results  Component Value Date   HGBA1C 6.0 (H) 05/16/2018     Assessment/Plan 1. Nausea -worsening in the last week. Encouraged to follow symptoms. Do they improve with eating or does it make it worse? Any foods? Triggers? Poor diet per daughter. No abdominal pain noted.  -certain foods? - CBC with Differential/Platelet; Future - CMP with eGFR([facility]); Future - Amylase; Future - Lipase; Future  2. Diarrhea, unspecified type -would recommend  Brat diet and to advance as tolerated. To decrease sugar and snacky foods and focus on foods with proper nutritional value.  To increase fiber in diet. Can add benfiber -to add probiotic twice daily - Stool culture; Future - C. difficile, PCR([facility]); Future - Ova and Parasite Exam; Future - CBC with Differential/Platelet; Future - CMP with eGFR([facility]); Future - Amylase; Future - Lipase; Future  3. Cough Has been using coricidin as needed, some days are worse than others. More lately with worsening of symptoms. Given that his has worsening cough and nausea. recommended to get COVID testing done. Daughter in agreement.  -continue supportive care but to call if worsening symptoms occur - CBC with Differential/Platelet; Future - CMP with eGFR([facility]);  Future  4. Type 2 diabetes mellitus without complication, without long-term current use of insulin (HCC) -diet controlled.  Encouraged dietary compliance, routine foot care/monitoring and to keep up with diabetic eye exams through ophthalmology  - Hemoglobin A1c; Future  Plans to get COVID test, once results are back can call and make appt to come into the office to get labs (if negative)  Daughter to also pick up for kit for stool sample for lab.   To notify if symptoms worsen. May need GI referral if symptoms fail to improve and pending lab work  [employer]. Harle Battiest  [facility] [phone]   Virtual Visit via Video Note  I connected with Liam Graham on 12/04/19 at 11:30 AM EST by a video enabled telemedicine application and verified that I am speaking with the correct person using two identifiers.  Location: Patient: home Provider: office   I discussed the limitations of evaluation and management by telemedicine and the availability of in person appointments. The patient expressed understanding and agreed to proceed.    I discussed the assessment and treatment plan with the patient. The patient was provided an opportunity to ask questions and all were answered. The patient agreed with the plan and demonstrated an understanding of the instructions.   The patient was advised to call back or seek an in-person evaluation if the symptoms worsen or if the condition fails to improve as anticipated.  I provided 18 minutes of non-face-to-face time during this encounter.  John Mason. Dewaine Oats, AGNP Avs printed and mailed.

## 2019-12-05 DIAGNOSIS — Z20822 Contact with and (suspected) exposure to covid-19: Secondary | ICD-10-CM | POA: Diagnosis not present

## 2019-12-10 ENCOUNTER — Other Ambulatory Visit: Payer: Self-pay | Admitting: Nurse Practitioner

## 2019-12-10 DIAGNOSIS — I5020 Unspecified systolic (congestive) heart failure: Secondary | ICD-10-CM | POA: Diagnosis not present

## 2019-12-10 DIAGNOSIS — N400 Enlarged prostate without lower urinary tract symptoms: Secondary | ICD-10-CM

## 2019-12-11 ENCOUNTER — Other Ambulatory Visit: Payer: Medicare HMO

## 2019-12-11 ENCOUNTER — Other Ambulatory Visit: Payer: Self-pay

## 2019-12-11 DIAGNOSIS — R05 Cough: Secondary | ICD-10-CM

## 2019-12-11 DIAGNOSIS — R197 Diarrhea, unspecified: Secondary | ICD-10-CM | POA: Diagnosis not present

## 2019-12-11 DIAGNOSIS — R110 Nausea: Secondary | ICD-10-CM | POA: Diagnosis not present

## 2019-12-11 DIAGNOSIS — E119 Type 2 diabetes mellitus without complications: Secondary | ICD-10-CM

## 2019-12-11 DIAGNOSIS — R059 Cough, unspecified: Secondary | ICD-10-CM

## 2019-12-11 NOTE — Addendum Note (Signed)
Addended by: Sueanne Margarita on: 12/11/2019 11:24 AM   Modules accepted: Orders

## 2019-12-12 LAB — COMPLETE METABOLIC PANEL WITH GFR
AG Ratio: 1.9 (calc) (ref 1.0–2.5)
ALT: 8 U/L — ABNORMAL LOW (ref 9–46)
AST: 9 U/L — ABNORMAL LOW (ref 10–35)
Albumin: 4 g/dL (ref 3.6–5.1)
Alkaline phosphatase (APISO): 69 U/L (ref 35–144)
BUN: 17 mg/dL (ref 7–25)
CO2: 21 mmol/L (ref 20–32)
Calcium: 9.1 mg/dL (ref 8.6–10.3)
Chloride: 108 mmol/L (ref 98–110)
Creat: 1.07 mg/dL (ref 0.70–1.11)
GFR, Est African American: 71 mL/min/{1.73_m2} (ref >=60)
GFR, Est Non African American: 62 mL/min/{1.73_m2} (ref >=60)
Globulin: 2.1 g/dL (calc) (ref 1.9–3.7)
Glucose, Bld: 110 mg/dL — ABNORMAL HIGH (ref 65–99)
Potassium: 4.2 mmol/L (ref 3.5–5.3)
Sodium: 140 mmol/L (ref 135–146)
Total Bilirubin: 0.7 mg/dL (ref 0.2–1.2)
Total Protein: 6.1 g/dL (ref 6.1–8.1)

## 2019-12-12 LAB — CBC WITH DIFFERENTIAL/PLATELET
Absolute Monocytes: 605 cells/uL (ref 200–950)
Basophils Absolute: 78 cells/uL (ref 0–200)
Basophils Relative: 1.2 %
Eosinophils Absolute: 234 cells/uL (ref 15–500)
Eosinophils Relative: 3.6 %
HCT: 36.5 % — ABNORMAL LOW (ref 38.5–50.0)
Hemoglobin: 12.1 g/dL — ABNORMAL LOW (ref 13.2–17.1)
Lymphs Abs: 1066 cells/uL (ref 850–3900)
MCH: 27.9 pg (ref 27.0–33.0)
MCHC: 33.2 g/dL (ref 32.0–36.0)
MCV: 84.1 fL (ref 80.0–100.0)
MPV: 10.4 fL (ref 7.5–12.5)
Monocytes Relative: 9.3 %
Neutro Abs: 4518 cells/uL (ref 1500–7800)
Neutrophils Relative %: 69.5 %
Platelets: 156 10*3/uL (ref 140–400)
RBC: 4.34 10*6/uL (ref 4.20–5.80)
RDW: 13.2 % (ref 11.0–15.0)
Total Lymphocyte: 16.4 %
WBC: 6.5 10*3/uL (ref 3.8–10.8)

## 2019-12-12 LAB — HEMOGLOBIN A1C
Hgb A1c MFr Bld: 6.1 % of total Hgb — ABNORMAL HIGH (ref <5.7)
Mean Plasma Glucose: 128 (calc)
eAG (mmol/L): 7.1 (calc)

## 2019-12-12 LAB — CLOSTRIDIUM DIFFICILE TOXIN B, QUALITATIVE, REAL-TIME PCR: Toxigenic C. Difficile by PCR: NOT DETECTED

## 2019-12-12 LAB — LIPASE: Lipase: 24 U/L (ref 7–60)

## 2019-12-12 LAB — AMYLASE: Amylase: 30 U/L (ref 21–101)

## 2019-12-15 LAB — STOOL CULTURE
MICRO NUMBER:: 10052326
MICRO NUMBER:: 10052327
MICRO NUMBER:: 10052328
SHIGA RESULT:: NOT DETECTED
SPECIMEN QUALITY:: ADEQUATE
SPECIMEN QUALITY:: ADEQUATE
SPECIMEN QUALITY:: ADEQUATE

## 2020-01-07 ENCOUNTER — Ambulatory Visit: Payer: Medicare HMO | Attending: Internal Medicine

## 2020-01-07 DIAGNOSIS — Z23 Encounter for immunization: Secondary | ICD-10-CM | POA: Insufficient documentation

## 2020-01-10 DIAGNOSIS — I5020 Unspecified systolic (congestive) heart failure: Secondary | ICD-10-CM | POA: Diagnosis not present

## 2020-01-30 ENCOUNTER — Ambulatory Visit: Payer: Medicare HMO | Attending: Internal Medicine

## 2020-01-30 DIAGNOSIS — Z23 Encounter for immunization: Secondary | ICD-10-CM | POA: Insufficient documentation

## 2020-01-30 NOTE — Progress Notes (Signed)
   Covid-19 Vaccination Clinic  Name:  REYCE LUBECK    MRN: [id number] DOB: [birth year]  01/30/2020  Mr. Chaffin was observed post Covid-19 immunization for 15 minutes without incident. He was provided with Vaccine Information Sheet and instruction to access the V-Safe system.   Mr. Spragg was instructed to call 911 with any severe reactions post vaccine: Marland Kitchen Difficulty breathing  . Swelling of face and throat  . A fast heartbeat  . A bad rash all over body  . Dizziness and weakness   Immunizations Administered    Name Date Dose VIS Date Route   Pfizer COVID-19 Vaccine 01/30/2020  6:40 PM 0.3 mL 11/03/2019 Intramuscular   Manufacturer: [employer], [employer]   Lot: [id number]   NDC: [address]-8

## 2020-01-31 ENCOUNTER — Ambulatory Visit: Payer: Medicare HMO

## 2020-02-07 DIAGNOSIS — I5020 Unspecified systolic (congestive) heart failure: Secondary | ICD-10-CM | POA: Diagnosis not present

## 2020-02-25 ENCOUNTER — Other Ambulatory Visit: Payer: Self-pay | Admitting: Cardiology

## 2020-02-25 DIAGNOSIS — I10 Essential (primary) hypertension: Secondary | ICD-10-CM

## 2020-03-09 DIAGNOSIS — I5020 Unspecified systolic (congestive) heart failure: Secondary | ICD-10-CM | POA: Diagnosis not present

## 2020-04-02 ENCOUNTER — Ambulatory Visit: Payer: Medicare HMO | Admitting: Cardiology

## 2020-04-02 ENCOUNTER — Other Ambulatory Visit: Payer: Self-pay | Admitting: Cardiology

## 2020-04-06 NOTE — Progress Notes (Signed)
Primary Physician/Referring:  Lauree Chandler, NP  Patient ID: John Mason, male    DOB: September 24, 1929, 86 y.o.   MRN: [id number]  Chief Complaint  Patient presents with  . Coronary Artery Disease  . Hypertension  . Follow-up    6 month  . Chest Pain   HPI:    John Mason  is a 86 y.o. Caucasian male  with CAD status post anterior wall MI requiring PCI to LAD in 2015, moderate to severe aortic regurgitation, hypertension, hyperlipidemia, and CKD, mildly depressed LVEF at 45% by echo in July 2020. He does have orthostatic hypotension and is compliant with support stockings.  Over the past 2 months patient's daughter has noticed that he is not active anymore, has shortness of breath with activity, fatigue.  He has used 2 sublingual nitroglycerin in the past 6 months.  No bleeding diathesis on aspirin, he has not had any frequent falls, but he has noticed frequent loose stools or increased frequency of bowel movements.  Past Medical History:  Diagnosis Date  . Abnormality of gait   . Actinic keratosis   . Cognitive deficits, late effect of cerebrovascular disease   . Contact dermatitis and other eczema due to other specified agent   . Dizziness and giddiness   . Elevated prostate specific antigen (PSA)   . Essential and other specified forms of tremor   . First degree atrioventricular block   . Gastric ulcer, unspecified as acute or chronic, without mention of hemorrhage, perforation, or obstruction   . Hypertrophy of prostate without urinary obstruction and other lower urinary tract symptoms (LUTS)   . Impotence of organic origin   . Insomnia, unspecified   . Obesity, unspecified   . Other abnormal blood chemistry   . Pain in joint, lower leg   . STEMI (ST elevation myocardial infarction) ([place]) 05/07/2014  . Unspecified essential hypertension   . Unspecified glaucoma(365.9)   . Unspecified vitamin D deficiency   . Urge incontinence   . Urinary frequency    Past  Surgical History:  Procedure Laterality Date  . APPENDECTOMY  1930's?  Marland Kitchen CATARACT EXTRACTION W/ INTRAOCULAR LENS  IMPLANT, BILATERAL Bilateral   . CORONARY ANGIOPLASTY WITH STENT PLACEMENT  05/07/2014   "1"  . CYST EXCISION Left 1977   "knee"  . LEFT HEART CATHETERIZATION WITH CORONARY ANGIOGRAM N/A 05/07/2014   Procedure: LEFT HEART CATHETERIZATION WITH CORONARY ANGIOGRAM;  Surgeon: Laverda Page, MD;  Location: [facility] CATH LAB;  Service: Cardiovascular;  Laterality: N/A;  . MULTIPLE TOOTH EXTRACTIONS    . TRANSURETHRAL RESECTION OF PROSTATE  1987   Family History  Problem Relation Age of Onset  . Stroke Mother   . Stroke Sister   . Stroke Brother     Social History   Tobacco Use  . Smoking status: Former Research scientist (life sciences)  . Smokeless tobacco: Current User    Types: Chew  Substance Use Topics  . Alcohol use: No   Marital Status: Widowed  ROS  Review of Systems  Cardiovascular: Positive for chest pain (stable; relieved with nitroglycerin). Negative for claudication, dyspnea on exertion, leg swelling, orthopnea, palpitations and syncope.  Endocrine: Negative for cold intolerance and heat intolerance.  Musculoskeletal: Negative for muscle weakness and myalgias.  Gastrointestinal: Positive for bowel incontinence and diarrhea. Negative for melena.  All other systems reviewed and are negative.  Objective  Blood pressure 140/63, pulse 80, temperature 97.6 F (36.4 C), temperature source Oral, resp. rate 18, height 5' 9"  (1.753 m),  weight 170 lb 3.2 oz (77.2 kg), SpO2 98 %.  Vitals with BMI 04/10/2020 10/02/2019 03/27/2019  Height 5' 9"  5' 9"  -  Weight 170 lbs 3 oz 172 lbs 172 lbs  BMI 97.41 63.84 -  Systolic 536 468 032  Diastolic 63 46 50  Pulse 80 65 65     Physical Exam  Constitutional: Vital signs are normal. He appears well-developed and well-nourished. No distress.  Cardiovascular: Normal rate, regular rhythm and intact distal pulses. Exam reveals no gallop.  Murmur heard.   Blowing midsystolic murmur is present with a grade of 2/6 at the upper right sternal border and apex.  Low-pitched holodiastolic murmur is present with a grade of 3/6 at the upper right sternal border and upper left sternal border. No leg edema, no JVD.   Pulmonary/Chest: Effort normal and breath sounds normal. No accessory muscle usage. No respiratory distress.  Abdominal: Soft. Bowel sounds are normal.  Musculoskeletal:        General: Normal range of motion.  Vitals reviewed.  Laboratory examination:   Recent Labs    12/11/19 0000  NA 140  K 4.2  CL 108  CO2 21  GLUCOSE 110*  BUN 17  CREATININE 1.07  CALCIUM 9.1  GFRNONAA 62  GFRAA 71   CrCl cannot be calculated (Patient's most recent lab result is older than the maximum 21 days allowed.).  CMP Latest Ref Rng & Units 12/11/2019 03/23/2019 05/16/2018  Glucose 65 - 99 mg/dL 110(H) 90 182(H)  BUN 7 - 25 mg/dL 17 16 31(H)  Creatinine 0.70 - 1.11 mg/dL 1.07 1.09 1.76(H)  Sodium 135 - 146 mmol/L 140 146(H) 141  Potassium 3.5 - 5.3 mmol/L 4.2 4.6 4.5  Chloride 98 - 110 mmol/L 108 107(H) 107  CO2 20 - 32 mmol/L 21 24 26   Calcium 8.6 - 10.3 mg/dL 9.1 9.5 9.4  Total Protein 6.1 - 8.1 g/dL 6.1 6.5 6.1  Total Bilirubin 0.2 - 1.2 mg/dL 0.7 0.6 0.5  Alkaline Phos 39 - 117 IU/L - 77 -  AST 10 - 35 U/L 9(L) 13 11  ALT 9 - 46 U/L 8(L) 9 8(L)   CBC Latest Ref Rng & Units 12/11/2019 03/23/2019 05/16/2018  WBC 3.8 - 10.8 Thousand/uL 6.5 6.8 6.3  Hemoglobin 13.2 - 17.1 g/dL 12.1(L) 13.2 12.8(L)  Hematocrit 38.5 - 50.0 % 36.5(L) 40.0 38.3(L)  Platelets 140 - 400 Thousand/uL 156 137(L) 138(L)   Last lipids Lab Results  Component Value Date   CHOL 109 03/23/2019   HDL 55 03/23/2019   LDLCALC 41 03/23/2019   TRIG 67 03/23/2019   CHOLHDL 2.1 08/26/2016     HEMOGLOBIN A1C Lab Results  Component Value Date   HGBA1C 6.1 (H) 12/11/2019   MPG 128 12/11/2019   TSH No results for input(s): TSH in the last 8760 hours.  No Known Allergies    Current Outpatient Medications  Medication Instructions  . atorvastatin (LIPITOR) 10 MG tablet TAKE 1 TABLET BY MOUTH EVERY DAY  . finasteride (PROSCAR) 5 MG tablet TAKE 1 TABLET BY MOUTH EVERY DAY  . furosemide (LASIX) 20 mg, Oral,  Every morning - 10a  . isosorbide mononitrate (IMDUR) 60 MG 24 hr tablet TAKE 1 TABLET BY MOUTH EVERY DAY  . latanoprost (XALATAN) 0.005 % ophthalmic solution 1 drop, Daily at bedtime  . NIFEdipine (PROCARDIA XL/NIFEDICAL-XL) 90 MG 24 hr tablet TAKE 1 TABLET BY MOUTH EVERY DAY  . nitroGLYCERIN (NITROSTAT) 0.4 mg, Sublingual, Every 5 min PRN  . rivaroxaban (  XARELTO) 20 mg, Oral, Daily with supper  . spironolactone (ALDACTONE) 12.5 mg, Oral, Daily  . triamcinolone cream (KENALOG) 0.1 % Apply sparingly up to twice daily to scaley areas on hands  . valsartan (DIOVAN) 320 mg, Oral, Daily   Radiology:   No results found.  Cardiac Studies:   Coronary angiogram with Extensive Ant. wall MI- 05/07/14: Had Primary PCI to proximal and midsegment LAD with implantation of a 4.0 x 28 mm Xience Alpine DES. Other vessels, mild disease.  Echocardiogram 05/29/2019: Mildly depressed LV systolic function with EF 45%. Left ventricle cavity is normal in size. Moderate concentric hypertrophy of the left ventricle. Normal global wall motion.  Left atrial cavity is mildly dilated. Trileaflet aortic valve with central coaptation defect. Moderate (Grade III) aortic regurgitation. Moderate (Grade II) mitral regurgitation. Mild to moderate tricuspid regurgitation. Estimated pulmonary artery systolic pressure is 29 mmHg. Compared to previous study in 10/25/2017, LVEF is mildly depressed. No other significant change noted.    EKG  EKG 04/10/2020: Atrial fibrillation with controlled ventricular response at the rate of 75 bpm, rightward axis, right bundle branch block.  No evidence of ischemia.  PVC, 1.   Compared to 09/29/2018, atrial fibrillation is new.   09/29/2018: Normal sinus  rhythm at 61 bpm, first-degree AV block, normal axis, right bundle branch block. No evidence of ischemia. No significant change compared to EKG 04/28/2018  Assessment     ICD-10-CM   1. Atherosclerosis of native coronary artery of native heart with stable angina pectoris ([place])  I25.118 EKG 12-Lead    PCV ECHOCARDIOGRAM COMPLETE  2. New onset atrial fibrillation ([place]). CHA2DS2-VASc Score is 4. Yearly risk of stroke: 4% (A, HTN, Vasc Dz).    I48.91 PCV ECHOCARDIOGRAM COMPLETE    CBC    Basic metabolic panel    rivaroxaban (XARELTO) 20 MG TABS tablet  3. Essential hypertension  I10   4. Moderate aortic regurgitation  I35.1   5. Stage 3a chronic kidney disease  N18.31   6. Chronic idiopathic thrombocytopenia (HCC)  D69.3      Meds ordered this encounter  Medications  . rivaroxaban (XARELTO) 20 MG TABS tablet    Sig: Take 1 tablet (20 mg total) by mouth daily with supper.    Dispense:  30 tablet    Refill:  3    Medications Discontinued During This Encounter  Medication Reason  . aspirin 81 MG chewable tablet Change in therapy  . hydrALAZINE (APRESOLINE) 50 MG tablet Side effect (s)    Recommendations:   Fairly active 86 y.o. Caucasian male with CAD status post anterior wall MI requiring PCI to LAD in 2015, ischemic cardiomyopathy with now improved EF to 55% in December 2018, moderate to severe aortic regurgitation, hypertension, hyperlipidemia, CKD stage 3. He does have orthostatic hypotension and is compliant with support stockings.  Over the past 2 months patient's daughter has noticed that he is not active anymore, has shortness of breath with activity, fatigue.  He now in new onset atrial fibrillation.  I had extensive discussion with the patient and his daughter regarding management of atrial fibrillation including use of anticoagulation.  Discontinue aspirin.  Risk of bleeding was discussed.  I will start him on Xarelto 20 mg after supper daily, I would like to check BMP along  with CBC in 2 weeks as patient does have chronic stage III kidney disease but recent labs from PCP reviewed from January 2021 reveals normalization of EGFR to stage II.  I would also follow-up  on thrombocytopenia that is chronic.  Complex decision making with regard to anticoagulation.  I had regurgitation murmur appears to be much more prominent, I will set him up for repeat echocardiogram.  I would like to see him back in 3 to 4 weeks for follow-up.    He complains of increased bowel movements and frequent stools, I have discontinued hydralazine to see whether his symptoms would improve, all medications were cross checked for the side effect.  He has stable angina that is easily relieved with nitroglycerin.  He has used 2 sublingual nitroglycerin over the past 6 months.    Adrian Prows, MD, [facility] 04/10/2020, 12:35 PM [facility] [place] Pager: [phone] Office: [phone]

## 2020-04-08 DIAGNOSIS — I5020 Unspecified systolic (congestive) heart failure: Secondary | ICD-10-CM | POA: Diagnosis not present

## 2020-04-10 ENCOUNTER — Ambulatory Visit: Payer: Medicare HMO | Admitting: Cardiology

## 2020-04-10 ENCOUNTER — Encounter: Payer: Self-pay | Admitting: Cardiology

## 2020-04-10 ENCOUNTER — Other Ambulatory Visit: Payer: Self-pay

## 2020-04-10 VITALS — BP 140/63 | HR 80 | Temp 97.6°F | Resp 18 | Ht 69.0 in | Wt 170.2 lb

## 2020-04-10 DIAGNOSIS — I10 Essential (primary) hypertension: Secondary | ICD-10-CM | POA: Diagnosis not present

## 2020-04-10 DIAGNOSIS — I351 Nonrheumatic aortic (valve) insufficiency: Secondary | ICD-10-CM

## 2020-04-10 DIAGNOSIS — I25118 Atherosclerotic heart disease of native coronary artery with other forms of angina pectoris: Secondary | ICD-10-CM | POA: Diagnosis not present

## 2020-04-10 DIAGNOSIS — I4891 Unspecified atrial fibrillation: Secondary | ICD-10-CM | POA: Diagnosis not present

## 2020-04-10 DIAGNOSIS — N1831 Chronic kidney disease, stage 3a: Secondary | ICD-10-CM

## 2020-04-10 DIAGNOSIS — D693 Immune thrombocytopenic purpura: Secondary | ICD-10-CM

## 2020-04-10 MED ORDER — RIVAROXABAN 20 MG PO TABS: 20.0000 mg | ORAL_TABLET | Freq: Every day | ORAL | 3 refills | Status: DC

## 2020-04-13 ENCOUNTER — Other Ambulatory Visit: Payer: Self-pay | Admitting: Cardiology

## 2020-04-15 ENCOUNTER — Telehealth: Payer: Self-pay

## 2020-04-15 NOTE — Telephone Encounter (Signed)
Called pt daughter to inform her to stop the xarelto and call pt GI dr and PCP daughter understood and will call pt PCP.

## 2020-04-15 NOTE — Telephone Encounter (Signed)
Bleeding where. He is tired because of a. Fib and needs cardioversion that is why he is on Xarelto at least for 2-3 months

## 2020-04-15 NOTE — Telephone Encounter (Signed)
Pt daughter called to inform us that pt has been bleeding since he has been taking Xarelto 20. Pt daughter mention pt is weak with no appetite. Please advise thank you

## 2020-04-16 ENCOUNTER — Encounter: Payer: Self-pay | Admitting: Family

## 2020-04-16 ENCOUNTER — Ambulatory Visit (INDEPENDENT_AMBULATORY_CARE_PROVIDER_SITE_OTHER): Payer: Medicare HMO | Admitting: Family

## 2020-04-16 ENCOUNTER — Other Ambulatory Visit: Payer: Self-pay

## 2020-04-16 VITALS — BP 120/58 | HR 80 | Temp 97.8°F | Ht 69.0 in | Wt 170.0 lb

## 2020-04-16 DIAGNOSIS — K921 Melena: Secondary | ICD-10-CM | POA: Diagnosis not present

## 2020-04-16 DIAGNOSIS — I2510 Atherosclerotic heart disease of native coronary artery without angina pectoris: Secondary | ICD-10-CM | POA: Diagnosis not present

## 2020-04-16 LAB — CBC WITH DIFFERENTIAL/PLATELET
Absolute Monocytes: 788 cells/uL (ref 200–950)
Basophils Absolute: 73 cells/uL (ref 0–200)
Basophils Relative: 1 %
Eosinophils Absolute: 307 cells/uL (ref 15–500)
Eosinophils Relative: 4.2 %
HCT: 34.7 % — ABNORMAL LOW (ref 38.5–50.0)
Hemoglobin: 11.5 g/dL — ABNORMAL LOW (ref 13.2–17.1)
Lymphs Abs: 1635 cells/uL (ref 850–3900)
MCH: 27.9 pg (ref 27.0–33.0)
MCHC: 33.1 g/dL (ref 32.0–36.0)
MCV: 84.2 fL (ref 80.0–100.0)
MPV: 10.2 fL (ref 7.5–12.5)
Monocytes Relative: 10.8 %
Neutro Abs: 4497 cells/uL (ref 1500–7800)
Neutrophils Relative %: 61.6 %
Platelets: 159 10*3/uL (ref 140–400)
RBC: 4.12 10*6/uL — ABNORMAL LOW (ref 4.20–5.80)
RDW: 14.1 % (ref 11.0–15.0)
Total Lymphocyte: 22.4 %
WBC: 7.3 10*3/uL (ref 3.8–10.8)

## 2020-04-16 MED ORDER — SPIRONOLACTONE 25 MG PO TABS: 12.5000 mg | ORAL_TABLET | Freq: Every day | ORAL | 3 refills | Status: AC

## 2020-04-16 NOTE — Patient Instructions (Signed)
-   continue to hold Xarelto and Asprin for now until blood work results return. - Please notify provider's office or go to ED  if you notice any blood in the stool.

## 2020-04-16 NOTE — Progress Notes (Signed)
Provider: Jimena Wieczorek FNP-C  Lauree Chandler, NP  Patient Care Team: Lauree Chandler, NP as PCP - General (Geriatric Medicine) Monna Fam, MD as Consulting Physician (Ophthalmology) Adrian Prows, MD as Consulting Physician (Cardiology)  Extended Emergency Contact Information Primary Emergency Contact: Laural Benes [place] Phone: [phone] Mobile Phone: [phone] Relation: Daughter  Code Status:  Full Code  Goals of care: Advanced Directive information Advanced Directives 04/16/2020  Does Patient Have a Medical Advance Directive? Yes  Type of Paramedic of [place];Living will;Out of facility DNR (pink MOST or yellow form)  Does patient want to make changes to medical advance directive? No - Patient declined  Copy of [place] in Chart? No - copy requested  Would patient like information on creating a medical advance directive? -  Pre-existing out of facility DNR order (yellow form or pink MOST form) -     Chief Complaint  Patient presents with  . Acute Visit    Blood in stool    HPI:  Pt is a 86 y.o. male seen today for an acute visit for evaluation of blood in the stool since starting on Xarelto prescribed by Cardiologist Dr.Ganji 04/10/2020.Daughter states noted bright red blood on patient's bed,underpants and floor.He also had several episodes in the bathroom.she bought diapers for patient which also had bright red blood from the rectum.states no issues with constipation or hemorrhoids.she notified Dr.Ganji and was advised to discontinue Xarelto and follow up with PCP.He states no bleeding from any where else.Bleeding has stopped since stopping Xarelto.He denies any dizziness,palpitation,chest pain or shortness of breath. He declines rectal exam today states it's waste of time.daughter was able to convince him to check blood work.    Past Medical History:  Diagnosis Date  . Abnormality of gait    . Actinic keratosis   . Cognitive deficits, late effect of cerebrovascular disease   . Contact dermatitis and other eczema due to other specified agent   . Dizziness and giddiness   . Elevated prostate specific antigen (PSA)   . Essential and other specified forms of tremor   . First degree atrioventricular block   . Gastric ulcer, unspecified as acute or chronic, without mention of hemorrhage, perforation, or obstruction   . Hypertrophy of prostate without urinary obstruction and other lower urinary tract symptoms (LUTS)   . Impotence of organic origin   . Insomnia, unspecified   . Obesity, unspecified   . Other abnormal blood chemistry   . Pain in joint, lower leg   . STEMI (ST elevation myocardial infarction) ([place]) 05/07/2014  . Unspecified essential hypertension   . Unspecified glaucoma(365.9)   . Unspecified vitamin D deficiency   . Urge incontinence   . Urinary frequency    Past Surgical History:  Procedure Laterality Date  . APPENDECTOMY  1930's?  Marland Kitchen CATARACT EXTRACTION W/ INTRAOCULAR LENS  IMPLANT, BILATERAL Bilateral   . CORONARY ANGIOPLASTY WITH STENT PLACEMENT  05/07/2014   "1"  . CYST EXCISION Left 1977   "knee"  . LEFT HEART CATHETERIZATION WITH CORONARY ANGIOGRAM N/A 05/07/2014   Procedure: LEFT HEART CATHETERIZATION WITH CORONARY ANGIOGRAM;  Surgeon: Laverda Page, MD;  Location: [facility] CATH LAB;  Service: Cardiovascular;  Laterality: N/A;  . MULTIPLE TOOTH EXTRACTIONS    . TRANSURETHRAL RESECTION OF PROSTATE  1987    No Known Allergies  Outpatient Encounter Medications as of 04/16/2020  Medication Sig  . atorvastatin (LIPITOR) 10 MG tablet TAKE 1 TABLET BY MOUTH  EVERY DAY  . finasteride (PROSCAR) 5 MG tablet TAKE 1 TABLET BY MOUTH EVERY DAY  . furosemide (LASIX) 20 MG tablet Take 1 tablet (20 mg total) by mouth every morning.  . isosorbide mononitrate (IMDUR) 60 MG 24 hr tablet TAKE 1 TABLET BY MOUTH EVERY DAY  . latanoprost (XALATAN) 0.005 % ophthalmic  solution Place 1 drop into both eyes at bedtime.   Marland Kitchen NIFEdipine (PROCARDIA XL/NIFEDICAL-XL) 90 MG 24 hr tablet TAKE 1 TABLET BY MOUTH EVERY DAY  . nitroGLYCERIN (NITROSTAT) 0.4 MG SL tablet Place 1 tablet (0.4 mg total) under the tongue every 5 (five) minutes as needed for chest pain.  Marland Kitchen spironolactone (ALDACTONE) 25 MG tablet Take 0.5 tablets (12.5 mg total) by mouth daily.  Marland Kitchen triamcinolone cream (KENALOG) 0.1 % Apply sparingly up to twice daily to scaley areas on hands  . valsartan (DIOVAN) 320 MG tablet TAKE 1 TABLET BY MOUTH EVERY DAY  . [DISCONTINUED] NIFEdipine (ADALAT CC) 90 MG 24 hr tablet Take 90 mg by mouth daily.  . [DISCONTINUED] rivaroxaban (XARELTO) 20 MG TABS tablet Take 1 tablet (20 mg total) by mouth daily with supper.   No facility-administered encounter medications on file as of 04/16/2020.    Review of Systems  Constitutional: Negative for chills, fatigue and fever.  HENT: Negative for congestion, nosebleeds, rhinorrhea, sinus pressure, sinus pain, sneezing and sore throat.   Respiratory: Negative for cough, chest tightness, shortness of breath and wheezing.   Cardiovascular: Negative for chest pain, palpitations and leg swelling.  Gastrointestinal: Negative for abdominal distention, abdominal pain, constipation, diarrhea, nausea and vomiting.       Blood in stool per HPI   Genitourinary: Negative for difficulty urinating, dysuria, flank pain, frequency, hematuria and urgency.  Skin: Negative for color change, pallor and rash.  Neurological: Negative for dizziness, syncope, weakness, light-headedness and headaches.    Immunization History  Administered Date(s) Administered  . Fluad Quad(high Dose 65+) 08/07/2019  . Influenza,inj,Quad PF,6+ Mos 12/07/2013  . Influenza-Unspecified 07/31/2009, 10/08/2010, 09/06/2012  . PFIZER SARS-COV-2 Vaccination 01/07/2020, 01/30/2020  . Pneumococcal Conjugate-13 05/16/2018  . Pneumococcal Polysaccharide-23 05/08/2014  . Tdap  02/03/2012   Pertinent  Health Maintenance Due  Topic Date Due  . FOOT EXAM  Never done  . OPHTHALMOLOGY EXAM  10/28/2017  . HEMOGLOBIN A1C  06/09/2020  . INFLUENZA VACCINE  06/23/2020  . PNA vac Low Risk Adult  Completed   Fall Risk  04/16/2020 12/04/2019 06/22/2017 05/13/2017 05/11/2017  Falls in the past year? 0 1 No Yes Yes  Number falls in past yr: 0 0 - - -  Injury with Fall? 0 0 - - -    Vitals:   04/16/20 0824  BP: (!) 120/58  Pulse: 80  Temp: 97.8 F (36.6 C)  SpO2: 98%  Weight: 170 lb (77.1 kg)  Height: 5\' 9"  (1.753 m)   Body mass index is 25.1 kg/m. Physical Exam Vitals reviewed.  Constitutional:      General: He is not in acute distress.    Appearance: He is normal weight. He is not ill-appearing.  HENT:     Head: Normocephalic.     Nose: No congestion or rhinorrhea.     Mouth/Throat:     Mouth: Mucous membranes are moist.     Pharynx: Oropharynx is clear. No oropharyngeal exudate or posterior oropharyngeal erythema.  Eyes:     General: No scleral icterus.       Right eye: No discharge.  Left eye: No discharge.     Conjunctiva/sclera: Conjunctivae normal.     Pupils: Pupils are equal, round, and reactive to light.  Neck:     Vascular: No carotid bruit.  Cardiovascular:     Rate and Rhythm: Normal rate.     Pulses: Normal pulses.     Heart sounds: Murmur present. No friction rub. No gallop.   Pulmonary:     Effort: Pulmonary effort is normal. No respiratory distress.     Breath sounds: Normal breath sounds. No wheezing, rhonchi or rales.  Chest:     Chest wall: No tenderness.  Abdominal:     General: Bowel sounds are normal. There is no distension.     Palpations: Abdomen is soft. There is no mass.     Tenderness: There is no abdominal tenderness. There is no right CVA tenderness, left CVA tenderness, guarding or rebound.  Genitourinary:    Comments: Declined rectal examination  Musculoskeletal:     Cervical back: Normal range of motion. No  rigidity or tenderness.  Lymphadenopathy:     Cervical: No cervical adenopathy.  Skin:    General: Skin is warm.     Coloration: Skin is not jaundiced or pale.     Findings: No bruising, erythema or rash.  Neurological:     Mental Status: Mental status is at baseline.     Cranial Nerves: No cranial nerve deficit.     Motor: No weakness.     Gait: Gait abnormal.  Psychiatric:        Mood and Affect: Mood normal.        Behavior: Behavior normal.        Thought Content: Thought content normal.        Judgment: Judgment normal.    Labs reviewed: Recent Labs    12/11/19 0000  NA 140  K 4.2  CL 108  CO2 21  GLUCOSE 110*  BUN 17  CREATININE 1.07  CALCIUM 9.1   Recent Labs    12/11/19 0000  AST 9*  ALT 8*  BILITOT 0.7  PROT 6.1   Recent Labs    12/11/19 0000  WBC 6.5  NEUTROABS 4,518  HGB 12.1*  HCT 36.5*  MCV 84.1  PLT 156   Lab Results  Component Value Date   TSH 1.36 05/13/2017   Lab Results  Component Value Date   HGBA1C 6.1 (H) 12/11/2019   Lab Results  Component Value Date   CHOL 109 03/23/2019   HDL 55 03/23/2019   LDLCALC 41 03/23/2019   TRIG 67 03/23/2019   CHOLHDL 2.1 08/26/2016    Significant Diagnostic Results in last 30 days:  No results found.  Assessment/Plan 1. Coronary artery disease involving native coronary artery of native heart without angina pectoris Chest pain free.continue on imdur,lipitor ,valsartan,nifedipine and Nitro PRN.Request refill for spironolactone as below.   - spironolactone (ALDACTONE) 25 MG tablet; Take 0.5 tablets (12.5 mg total) by mouth daily.  Dispense: 90 tablet; Refill: 3 - continue to follow up with Cardiologist Dr.Ganji  2. Blood in the stool Bright red blood since starting on Xarelto.Has resolved with stopping Xarelto. - declined rectal exam today since bleeding has stopped. - Advised to continue to hold Xarelto and Asprin for now until blood work results return. - Notify provider's office or go to  ED  if you notice any blood in the stool. - Will refer to Gastroenterologist if rectal bleeding recurs.  - CBC with Differential/Platelet   Family/ staff Communication:  Reviewed plan of care with patient and daughter verbalized understanding.   Labs/tests ordered: - CBC with Differential/Platelet   Next Appointment: As needed if symptoms worsen or fail to improve.  Caesar Bookman, NP

## 2020-04-24 ENCOUNTER — Other Ambulatory Visit: Payer: Self-pay

## 2020-04-24 ENCOUNTER — Ambulatory Visit: Payer: Medicare HMO

## 2020-04-24 DIAGNOSIS — I4891 Unspecified atrial fibrillation: Secondary | ICD-10-CM | POA: Diagnosis not present

## 2020-04-24 DIAGNOSIS — I25118 Atherosclerotic heart disease of native coronary artery with other forms of angina pectoris: Secondary | ICD-10-CM

## 2020-05-08 ENCOUNTER — Encounter: Payer: Self-pay | Admitting: Cardiology

## 2020-05-08 ENCOUNTER — Other Ambulatory Visit: Payer: Self-pay

## 2020-05-08 ENCOUNTER — Ambulatory Visit: Payer: Medicare HMO | Admitting: Cardiology

## 2020-05-08 VITALS — BP 116/43 | HR 82 | Ht 69.0 in | Wt 166.0 lb

## 2020-05-08 DIAGNOSIS — I351 Nonrheumatic aortic (valve) insufficiency: Secondary | ICD-10-CM

## 2020-05-08 DIAGNOSIS — I25118 Atherosclerotic heart disease of native coronary artery with other forms of angina pectoris: Secondary | ICD-10-CM | POA: Diagnosis not present

## 2020-05-08 DIAGNOSIS — R0609 Other forms of dyspnea: Secondary | ICD-10-CM | POA: Diagnosis not present

## 2020-05-08 DIAGNOSIS — R0600 Dyspnea, unspecified: Secondary | ICD-10-CM

## 2020-05-08 DIAGNOSIS — Z66 Do not resuscitate: Secondary | ICD-10-CM | POA: Diagnosis not present

## 2020-05-08 DIAGNOSIS — I4891 Unspecified atrial fibrillation: Secondary | ICD-10-CM | POA: Diagnosis not present

## 2020-05-08 DIAGNOSIS — I10 Essential (primary) hypertension: Secondary | ICD-10-CM

## 2020-05-08 MED ORDER — VALSARTAN 160 MG PO TABS: 160.0000 mg | ORAL_TABLET | Freq: Every day | ORAL | 1 refills | Status: AC

## 2020-05-08 NOTE — Progress Notes (Signed)
Primary Physician/Referring:  Sharon Seller, NP  Patient ID: John Mason, male    DOB: [birth year], 86 y.o.   MRN: [id number]  Chief Complaint  Patient presents with  . Atrial Fibrillation  . Results  . Follow-up    4 week with EKG   HPI:    John Mason  is a 86 y.o. Caucasian male  with CAD status post anterior wall MI requiring PCI to LAD in 2015, moderate to severe aortic regurgitation, hypertension, hyperlipidemia, and CKD, mildly depressed LVEF at 45% by echo in July 2020. He does have orthostatic hypotension and is compliant with support stockings.  He was found to have nocturnal hypoxemia and was prescribed oxygen which he is not using.  Over the past 2 months patient's daughter has noticed that he is not active anymore, has shortness of breath with activity, fatigue.  He has not had any frequent falls, but he has noticed frequent loose stools or increased frequency of bowel movements.  On his last office visit 3 weeks ago I noticed him to be in atrial fibrillation.  I had started him on Xarelto for anticoagulation, he developed acute GI bleed and marked fatigue and generalized weakness with 1 g drop in hemoglobin and hence it was discontinued.  He is also not restarted back his aspirin.  Patient appears withdrawn and states that he is not interested in using oxygen and that he is accepted death due to his advanced age.  Past Medical History:  Diagnosis Date  . Abnormality of gait   . Actinic keratosis   . Cognitive deficits, late effect of cerebrovascular disease   . Contact dermatitis and other eczema due to other specified agent   . Dizziness and giddiness   . Elevated prostate specific antigen (PSA)   . Essential and other specified forms of tremor   . First degree atrioventricular block   . Gastric ulcer, unspecified as acute or chronic, without mention of hemorrhage, perforation, or obstruction   . Hypertrophy of prostate without urinary obstruction and other  lower urinary tract symptoms (LUTS)   . Impotence of organic origin   . Insomnia, unspecified   . Obesity, unspecified   . Other abnormal blood chemistry   . Pain in joint, lower leg   . STEMI (ST elevation myocardial infarction) ([facility]) 05/07/2014  . Unspecified essential hypertension   . Unspecified glaucoma(365.9)   . Unspecified vitamin D deficiency   . Urge incontinence   . Urinary frequency    Past Surgical History:  Procedure Laterality Date  . APPENDECTOMY  1930's?  Marland Kitchen CATARACT EXTRACTION W/ INTRAOCULAR LENS  IMPLANT, BILATERAL Bilateral   . CORONARY ANGIOPLASTY WITH STENT PLACEMENT  05/07/2014   "1"  . CYST EXCISION Left 1977   "knee"  . LEFT HEART CATHETERIZATION WITH CORONARY ANGIOGRAM N/A 05/07/2014   Procedure: LEFT HEART CATHETERIZATION WITH CORONARY ANGIOGRAM;  Surgeon: Pamella Pert, MD;  Location: [facility] CATH LAB;  Service: Cardiovascular;  Laterality: N/A;  . MULTIPLE TOOTH EXTRACTIONS    . TRANSURETHRAL RESECTION OF PROSTATE  1987   Family History  Problem Relation Age of Onset  . Stroke Mother   . Stroke Sister   . Stroke Brother     Social History   Tobacco Use  . Smoking status: Former Games developer  . Smokeless tobacco: Current User    Types: Chew  . Tobacco comment: Smokeless Tobacco, Patient chews everyday.  Substance Use Topics  . Alcohol use: No   Marital Status: Widowed  ROS  Review of Systems  Cardiovascular: Positive for chest pain.  Gastrointestinal: Positive for bowel incontinence, diarrhea and hematochezia.   Objective  Blood pressure (!) 116/43, pulse 82, height 5\' 9"  (1.753 m), weight 166 lb (75.3 kg), SpO2 98 %.  Vitals with BMI 05/08/2020 04/16/2020 04/10/2020  Height 5\' 9"  5\' 9"  5\' 9"   Weight 166 lbs 170 lbs 170 lbs 3 oz  BMI 24.5 25.09 25.12  Systolic 116 120 04/12/2020  Diastolic 43 58 63  Pulse 82 80 80     Physical Exam Vitals reviewed.  Constitutional:      Appearance: He is well-developed.  Cardiovascular:     Rate and Rhythm:  Normal rate and regular rhythm.     Pulses: Intact distal pulses.     Heart sounds: Murmur heard.  Blowing midsystolic murmur is present with a grade of 2/6 at the upper right sternal border and apex.  Low-pitched holodiastolic murmur is present with a grade of 3/4 at the upper right sternal border and upper left sternal border.  No gallop.      Comments: No leg edema, no JVD.  Pulmonary:     Effort: Pulmonary effort is normal. No accessory muscle usage or respiratory distress.     Breath sounds: Normal breath sounds.  Abdominal:     General: Bowel sounds are normal.     Palpations: Abdomen is soft.    Laboratory examination:   Recent Labs    12/11/19 0000  NA 140  K 4.2  CL 108  CO2 21  GLUCOSE 110*  BUN 17  CREATININE 1.07  CALCIUM 9.1  GFRNONAA 62  GFRAA 71   CrCl cannot be calculated (Patient's most recent lab result is older than the maximum 21 days allowed.).  CMP Latest Ref Rng & Units 12/11/2019 03/23/2019 05/16/2018  Glucose 65 - 99 mg/dL 12/13/19) 90 12/13/2019)  BUN 7 - 25 mg/dL 17 16 03/25/2019)  Creatinine 0.70 - 1.11 mg/dL 05/18/2018 619(J 093(O)  Sodium 135 - 146 mmol/L 140 146(H) 141  Potassium 3.5 - 5.3 mmol/L 4.2 4.6 4.5  Chloride 98 - 110 mmol/L 108 107(H) 107  CO2 20 - 32 mmol/L 21 24 26   Calcium 8.6 - 10.3 mg/dL 9.1 9.5 9.4  Total Protein 6.1 - 8.1 g/dL 6.1 6.5 6.1  Total Bilirubin 0.2 - 1.2 mg/dL 0.7 0.6 0.5  Alkaline Phos 39 - 117 IU/L - 77 -  AST 10 - 35 U/L 9(L) 13 11  ALT 9 - 46 U/L 8(L) 9 8(L)   CBC Latest Ref Rng & Units 04/16/2020 12/11/2019 03/23/2019  WBC 3.8 - 10.8 Thousand/uL 7.3 6.5 6.8  Hemoglobin 13.2 - 17.1 g/dL 11.5(L) 12.1(L) 13.2  Hematocrit 38 - 50 % 34.7(L) 36.5(L) 40.0  Platelets 140 - 400 Thousand/uL 159 156 137(L)   Last lipids Lab Results  Component Value Date   CHOL 109 03/23/2019   HDL 55 03/23/2019   LDLCALC 41 03/23/2019   TRIG 67 03/23/2019   CHOLHDL 2.1 08/26/2016     HEMOGLOBIN A1C Lab Results  Component Value Date   HGBA1C  6.1 (H) 12/11/2019   MPG 128 12/11/2019   TSH No results for input(s): TSH in the last 8760 hours.  No Known Allergies   Current Outpatient Medications  Medication Instructions  . atorvastatin (LIPITOR) 10 MG tablet TAKE 1 TABLET BY MOUTH EVERY DAY  . finasteride (PROSCAR) 5 MG tablet TAKE 1 TABLET BY MOUTH EVERY DAY  . furosemide (LASIX) 20 mg, Oral,  Every  morning - 10a  . isosorbide mononitrate (IMDUR) 60 MG 24 hr tablet TAKE 1 TABLET BY MOUTH EVERY DAY  . latanoprost (XALATAN) 0.005 % ophthalmic solution 1 drop, Daily at bedtime  . NIFEdipine (PROCARDIA XL/NIFEDICAL-XL) 90 MG 24 hr tablet TAKE 1 TABLET BY MOUTH EVERY DAY  . nitroGLYCERIN (NITROSTAT) 0.4 mg, Sublingual, Every 5 min PRN  . spironolactone (ALDACTONE) 12.5 mg, Oral, Daily  . triamcinolone cream (KENALOG) 0.1 % Apply sparingly up to twice daily to scaley areas on hands  . valsartan (DIOVAN) 160 mg, Oral, Daily   Radiology:   No results found.  Cardiac Studies:   Coronary angiogram with Extensive Ant. wall MI- 05/07/14: Had Primary PCI to proximal and midsegment LAD with implantation of a 4.0 x 28 mm Xience Alpine DES. Other vessels, mild disease.  Echocardiogram 04/24/2020:  Mildly depressed LV systolic function with visual EF 45-50%. Left  ventricle cavity is normal in size. Moderate to severe left ventricular  hypertrophy. Doppler evidence of grade II diastolic dysfunction, elevated  LAP. Calculated EF 40%.  Left atrial cavity is severely dilated at 56ml/m2.  Grossly right atrial size is dilated.  Trileaflet aortic valve. Moderate to severe aortic regurgitation.  Moderate (Grade II) mitral regurgitation.  Moderate tricuspid regurgitation. Mild pulmonary hypertension. RVSP 41  mmHg.  Sino-tubular junction is dilated at 4.3cm.  Proximal ascending aorta not well visualized.  IVC is dilated with a respiratory response of >50%.  Compared to prior study dated 05/29/2019: new findings include Grade 2 DD,  AR is now  moderate to severe, mild PHTN, and Sino-tubular junction is  dilated at 4.3cm.   Six Minute Walk - 05/08/20 1306      Six Minute Walk   Supplemental oxygen during test? No    Lap distance in meters  20 meters    Laps Completed  5    Partial lap (in meters) 0 meters    Baseline Heartrate 74    Baseline Dyspnea (Borg Scale) 97      Interval Oxygen Saturation and HR    2 Minute Oxygen Saturation % 100 %    2 Minute HR 104    4 Minute Oxygen Saturation % 100 %    4 Minute HR 85    6 Minute Oxygen Saturation % 100 %    6 Minute HR 84      End of Test Values   Heartrate 74    SPO2 100 %      2 Minutes Post Walk Values   Stopped or paused before six minutes? Yes    Reason: Patient paused towards the end, but he did finish to the end.    Other Symptoms at end of exercise: Hip pain;Leg pain;Calf pain      Interpretation   Distance completed 100 meters    Tech Comments: Patient wanted to stop at about 2 minutes left, but he went ahead and finshed.           EKG  EKG 05/08/2020: Atrial fibrillation with controlled ventricular response at the rate of 69 bpm, left axis deviation, left anterior fascicular block.  Right bundle branch block.  No evidence of ischemia.  No significant change from 04/10/2020.  09/29/2018: Normal sinus rhythm at 61 bpm, first-degree AV block, normal axis, right bundle branch block. No evidence of ischemia. No significant change compared to EKG 04/28/2018  Assessment     ICD-10-CM   1. New onset atrial fibrillation ([place]). CHA2DS2-VASc Score is 4. Yearly risk of stroke:  4% (A, HTN, Vasc Dz).    I48.91 6 minute walk  2. Essential hypertension  I10 EKG 12-Lead    valsartan (DIOVAN) 160 MG tablet  3. Atherosclerosis of native coronary artery of native heart with stable angina pectoris ([facility])  I25.118   4. Moderate aortic regurgitation  I35.1   5. Dyspnea on exertion  R06.00   6. DNR (do not resuscitate)  Z66      Meds ordered this encounter  Medications    . valsartan (DIOVAN) 160 MG tablet    Sig: Take 1 tablet (160 mg total) by mouth daily.    Dispense:  90 tablet    Refill:  1    Dose reduced from 320 mg    Medications Discontinued During This Encounter  Medication Reason  . valsartan (DIOVAN) 320 MG tablet     Recommendations:   LLOYD AYO is a  86 y.o. Caucasian male  with CAD status post anterior wall MI requiring PCI to LAD in 2015, moderate to severe aortic regurgitation, hypertension, hyperlipidemia, and CKD, mildly depressed LVEF at 45% by echo in July 2020. He does have orthostatic hypotension and is compliant with support stockings.  He was found to have nocturnal hypoxemia and was prescribed oxygen which he is not using.  I see him for 3 to 4 weeks ago when he had marked fatigue and dyspnea on exertion over the past 2 months, he was found to be in new onset atrial fibrillation.  I started him on Xarelto but immediately he had rectal bleed and mild drop in hemoglobin hence it was discontinued.  He underwent echocardiogram and presents for follow-up, continues to have fatigue and dyspnea.  No PND or orthopnea.  No change in leg edema.  I encouraged him to use nocturnal oxygen.  Patient is now 86 years of age.  With regard to his new onset dyspnea and fatigue it is probably related to atrial fibrillation but in view of inability to tolerate Xarelto, due to GI bleed, options are limited.  I have also discontinued aspirin.  His blood pressure is borderline low since I saw him last time also this time, will reduce valsartan from 300 mg to 160 mg daily.  6-minute walk test evaluated.  No significant desaturation, heart rate did drop but I am not sure it is accurate as he has underlying atrial fibrillation and on auscultation.  But he probably has a component of AV nodal disease as his heart rate did not go up.  He has stable angina that is easily relieved with nitroglycerin.  No recent NTG use. He may be heading towards palliative  care. I will see him back in 6 weeks and see if he should be referred to Palliative/hospice care. Discussed with daughter.  Marland Kitchen  MOST form filled and DNR status updated and ACP performed.   Yates Decamp, MD, [facility] 05/08/2020, 10:22 PM [facility]. [place] Pager: [phone] Office: [phone]

## 2020-05-09 DIAGNOSIS — I5020 Unspecified systolic (congestive) heart failure: Secondary | ICD-10-CM | POA: Diagnosis not present

## 2020-05-15 ENCOUNTER — Telehealth: Payer: Self-pay | Admitting: *Deleted

## 2020-05-15 NOTE — Telephone Encounter (Signed)
Daughter, Dondra Spry called and stated that patient was seen by Dinah on 04/15/20 for diarrhea and it is no better. Stated that it gets everywhere. Daughter stated that patient has gotten so slow that she feels when the urge hits him, he can't make it to the bathroom in time. No stomach pain, No fever.   Daughter is just wanting to know if she can safely give him something to help with this. Stated that it is making patient very uncomfortable and getting everywhere.   Please Advise.

## 2020-05-15 NOTE — Telephone Encounter (Signed)
Having any blood in the stool?If no blood in the stool  May take Loperamide 2 mg tablet take 2 tablets( 4 mg ) initially then 2 mg tablet after every loose stool.Do not exceed 16 mg per day.Will need to get a stool specimen for ova and cyst and culture if loperamide is not effective.

## 2020-05-15 NOTE — Telephone Encounter (Signed)
LMOM to return call.

## 2020-05-16 NOTE — Telephone Encounter (Signed)
LMOM to return call.

## 2020-05-17 NOTE — Telephone Encounter (Signed)
LMOM to return call.

## 2020-05-20 NOTE — Telephone Encounter (Signed)
Noted! Thank you
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
## 2020-06-05 ENCOUNTER — Other Ambulatory Visit: Payer: Self-pay | Admitting: Nurse Practitioner

## 2020-06-05 DIAGNOSIS — N400 Enlarged prostate without lower urinary tract symptoms: Secondary | ICD-10-CM

## 2020-06-08 DIAGNOSIS — I5020 Unspecified systolic (congestive) heart failure: Secondary | ICD-10-CM | POA: Diagnosis not present

## 2020-06-19 ENCOUNTER — Ambulatory Visit: Payer: Medicare HMO | Admitting: Cardiology

## 2020-06-19 ENCOUNTER — Encounter: Payer: Self-pay | Admitting: Cardiology

## 2020-06-19 ENCOUNTER — Other Ambulatory Visit: Payer: Self-pay

## 2020-06-19 VITALS — BP 136/57 | HR 59 | Resp 17 | Ht 69.0 in | Wt 163.0 lb

## 2020-06-19 DIAGNOSIS — I351 Nonrheumatic aortic (valve) insufficiency: Secondary | ICD-10-CM | POA: Diagnosis not present

## 2020-06-19 DIAGNOSIS — I10 Essential (primary) hypertension: Secondary | ICD-10-CM | POA: Diagnosis not present

## 2020-06-19 DIAGNOSIS — Z66 Do not resuscitate: Secondary | ICD-10-CM | POA: Diagnosis not present

## 2020-06-19 DIAGNOSIS — R059 Cough, unspecified: Secondary | ICD-10-CM

## 2020-06-19 DIAGNOSIS — R05 Cough: Secondary | ICD-10-CM | POA: Diagnosis not present

## 2020-06-19 NOTE — Progress Notes (Signed)
Primary Physician/Referring:  Sharon SellerEubanks, Jessica K, NP  Patient ID: John Mason, male    DOB: 15-Dec-1928, 86 y.o.   MRN: [id number]  No chief complaint on file.  HPI:    John Mason  is a 86 y.o. Caucasian male  with CAD status post anterior wall MI requiring PCI to LAD in 2015, moderate to severe aortic regurgitation, hypertension, hyperlipidemia, and CKD, mildly depressed LVEF at 45% by echo in July 2020.   He does have orthostatic hypotension and is compliant with support stockings.  He was found to have nocturnal hypoxemia and was prescribed oxygen which he is not using regularly.  He was also found to have paroxysmal atrial fibrillation but could not tolerate anticoagulation due to GI bleed.  He is off of aspirin as well.  On his last office visit 6 weeks ago, he had complained of marked fatigue and dizziness, his blood pressure was low, and reduce the dose of valsartan HCT.  He now presents for follow-up and he has noticed improvement in dizziness and also his energy levels.  He is accompanied by his son-in-law.  Past Medical History:  Diagnosis Date  . Abnormality of gait   . Actinic keratosis   . Cognitive deficits, late effect of cerebrovascular disease   . Contact dermatitis and other eczema due to other specified agent   . Dizziness and giddiness   . Elevated prostate specific antigen (PSA)   . Essential and other specified forms of tremor   . First degree atrioventricular block   . Gastric ulcer, unspecified as acute or chronic, without mention of hemorrhage, perforation, or obstruction   . Hypertrophy of prostate without urinary obstruction and other lower urinary tract symptoms (LUTS)   . Impotence of organic origin   . Insomnia, unspecified   . Obesity, unspecified   . Other abnormal blood chemistry   . Pain in joint, lower leg   . STEMI (ST elevation myocardial infarction) (HCC) 05/07/2014  . Unspecified essential hypertension   . Unspecified glaucoma(365.9)     . Unspecified vitamin D deficiency   . Urge incontinence   . Urinary frequency    Past Surgical History:  Procedure Laterality Date  . APPENDECTOMY  1930's?  Marland Kitchen. CATARACT EXTRACTION W/ INTRAOCULAR LENS  IMPLANT, BILATERAL Bilateral   . CORONARY ANGIOPLASTY WITH STENT PLACEMENT  05/07/2014   "1"  . CYST EXCISION Left 1977   "knee"  . LEFT HEART CATHETERIZATION WITH CORONARY ANGIOGRAM N/A 05/07/2014   Procedure: LEFT HEART CATHETERIZATION WITH CORONARY ANGIOGRAM;  Surgeon: Pamella PertJagadeesh R Dhyana Bastone, MD;  [facility];  Service: Cardiovascular;  Laterality: N/A;  . MULTIPLE TOOTH EXTRACTIONS    . TRANSURETHRAL RESECTION OF PROSTATE  1987   Family History  Problem Relation Age of Onset  . Stroke Mother   . Stroke Sister   . Stroke Brother     Social History   Tobacco Use  . Smoking status: Never Smoker  . Smokeless tobacco: Current User    Types: Chew  . Tobacco comment: Smokeless Tobacco, Patient chews everyday.  Substance Use Topics  . Alcohol use: No   Marital Status: Widowed  ROS  Review of Systems  Cardiovascular: Positive for dyspnea on exertion. Negative for chest pain.  Respiratory: Positive for cough.   Gastrointestinal: Positive for bowel incontinence.   Objective  Blood pressure (!) 136/57, pulse 59, resp. rate 17, height 5\' 9"  (1.753 m), weight 163 lb (73.9 kg), SpO2 97 %.  Vitals with BMI 06/19/2020  05/08/2020 04/16/2020  Height 5\' 9"  5\' 9"  5\' 9"   Weight 163 lbs 166 lbs 170 lbs  BMI 24.06 24.5 25.09  Systolic 136 116  Diastolic 57 43 58  Pulse 59 82 80     Physical Exam Vitals reviewed.  Constitutional:      Appearance: He is well-developed.  Cardiovascular:     Rate and Rhythm: Normal rate and regular rhythm.     Pulses: Intact distal pulses.     Heart sounds: Murmur heard.  Blowing midsystolic murmur is present with a grade of 2/6 at the upper right sternal border and apex.  Low-pitched holodiastolic murmur is present with a grade of 3/4 at the  upper right sternal border and upper left sternal border.  No gallop.      Comments: No leg edema, no JVD.  Pulmonary:     Effort: Pulmonary effort is normal. No accessory muscle usage or respiratory distress.     Breath sounds: Normal breath sounds.  Abdominal:     General: Bowel sounds are normal.     Palpations: Abdomen is soft.    Laboratory examination:   Recent Labs    12/11/19 0000  NA 140  K 4.2  CL 108  CO2 21  GLUCOSE 110*  BUN 17  CREATININE 1.07  CALCIUM 9.1  GFRNONAA 62  GFRAA 71   CrCl cannot be calculated (Patient's most recent lab result is older than the maximum 21 days allowed.).  CMP Latest Ref Rng & Units 12/11/2019 03/23/2019 05/16/2018  Glucose 65 - 99 mg/dL 12/13/2019) 90 03/25/2019)  BUN 7 - 25 mg/dL 17 16 05/18/2018)  Creatinine 0.70 - 1.11 mg/dL 099(I 338(S 50(N)  Sodium 135 - 146 mmol/L 140 146(H) 141  Potassium 3.5 - 5.3 mmol/L 4.2 4.6 4.5  Chloride 98 - 110 mmol/L 108 107(H) 107  CO2 20 - 32 mmol/L 21 24 26   Calcium 8.6 - 10.3 mg/dL 9.1 9.5 9.4  Total Protein 6.1 - 8.1 g/dL 6.1 6.5 6.1  Total Bilirubin 0.2 - 1.2 mg/dL 0.7 0.6 0.5  Alkaline Phos 39 - 117 IU/L - 77 -  AST 10 - 35 U/L 9(L) 13 11  ALT 9 - 46 U/L 8(L) 9 8(L)   CBC Latest Ref Rng & Units 04/16/2020 12/11/2019 03/23/2019  WBC 3.8 - 10.8 Thousand/uL 7.3 6.5 6.8  Hemoglobin 13.2 - 17.1 g/dL 11.5(L) 12.1(L) 13.2  Hematocrit 38 - 50 % 34.7(L) 36.5(L) 40.0  Platelets 140 - 400 Thousand/uL 159 156 137(L)   Last lipids Lab Results  Component Value Date   CHOL 109 03/23/2019   HDL 55 03/23/2019   LDLCALC 41 03/23/2019   TRIG 67 03/23/2019   CHOLHDL 2.1 08/26/2016     HEMOGLOBIN A1C Lab Results  Component Value Date   HGBA1C 6.1 (H) 12/11/2019   MPG 128 12/11/2019   TSH No results for input(s): TSH in the last 8760 hours.  No Known Allergies   Current Outpatient Medications  Medication Instructions  . atorvastatin (LIPITOR) 10 MG tablet TAKE 1 TABLET BY MOUTH EVERY DAY  . finasteride  (PROSCAR) 5 MG tablet TAKE 1 TABLET BY MOUTH EVERY DAY  . isosorbide mononitrate (IMDUR) 60 MG 24 hr tablet TAKE 1 TABLET BY MOUTH EVERY DAY  . latanoprost (XALATAN) 0.005 % ophthalmic solution 1 drop, Daily at bedtime  . NIFEdipine (PROCARDIA XL/NIFEDICAL-XL) 90 MG 24 hr tablet TAKE 1 TABLET BY MOUTH EVERY DAY  . nitroGLYCERIN (NITROSTAT) 0.4 mg, Sublingual, Every 5 min PRN  .  spironolactone (ALDACTONE) 12.5 mg, Oral, Daily  . triamcinolone cream (KENALOG) 0.1 % Apply sparingly up to twice daily to scaley areas on hands  . valsartan (DIOVAN) 160 mg, Oral, Daily   Radiology:   No results found.  Cardiac Studies:   Coronary angiogram with Extensive Ant. wall MI- 05/07/14: Had Primary PCI to proximal and midsegment LAD with implantation of a 4.0 x 28 mm Xience Alpine DES. Other vessels, mild disease.  Echocardiogram 04/24/2020:  Mildly depressed LV systolic function with visual EF 45-50%. Left  ventricle cavity is normal in size. Moderate to severe left ventricular  hypertrophy. Doppler evidence of grade II diastolic dysfunction, elevated  LAP. Calculated EF 40%.  Left atrial cavity is severely dilated at 25ml/m2.  Grossly right atrial size is dilated.  Trileaflet aortic valve. Moderate to severe aortic regurgitation.  Moderate (Grade II) mitral regurgitation.  Moderate tricuspid regurgitation. Mild pulmonary hypertension. RVSP 41  mmHg.  Sino-tubular junction is dilated at 4.3cm.  Proximal ascending aorta not well visualized.  IVC is dilated with a respiratory response of >50%.  Compared to prior study dated 05/29/2019: new findings include Grade 2 DD,  AR is now moderate to severe, mild PHTN, and Sino-tubular junction is  dilated at 4.3cm.    EKG  EKG 05/08/2020: Atrial fibrillation with controlled ventricular response at the rate of 69 bpm, left axis deviation, left anterior fascicular block.  Right bundle branch block.  No evidence of ischemia.  No significant change from  04/10/2020.  09/29/2018: Normal sinus rhythm at 61 bpm, first-degree AV block, normal axis, right bundle branch block. No evidence of ischemia. No significant change compared to EKG 04/28/2018  Assessment     ICD-10-CM   1. Essential hypertension  I10   2. Moderate aortic regurgitation  I35.1   3. DNR (do not resuscitate)  Z66   4. Cough  R05 DG Chest 2 View     No orders of the defined types were placed in this encounter.   Medications Discontinued During This Encounter  Medication Reason  . furosemide (LASIX) 20 MG tablet Discontinued by provider    Recommendations:   John Mason is a  86 y.o. Caucasian male  with CAD status post anterior wall MI requiring PCI to LAD in 2015, moderate to severe aortic regurgitation, hypertension, hyperlipidemia, and CKD, mildly depressed LVEF at 45% by echo in July 2020. He does have orthostatic hypotension and is compliant with support stockings.  He was found to have nocturnal hypoxemia and was prescribed oxygen which he is not using.  Due to paroxysmal atrial fibrillation, he was started on Xarelto however due to GI bleed it was discontinued.  On his last office visit 4 weeks ago, he was complaining of marked fatigue and his blood pressure was also low, I had reduced the dose of valsartan and brought him in to reevaluate his symptoms.  Since the medication change, his energy level is improved.  He has not had any dizziness or syncope.  Complains of chronic cough.  I suspect he probably has chronic aspiration.  I will obtain a chest x-ray PA and lateral view.  I will request Abbey Chatters, NP to see whether he would benefit from a cough syrup with codeine as it may also help with bowel incontinence.  I encouraged him to use nocturnal oxygen.  Patient is now 86 years of age.   He has stable angina that is easily relieved with nitroglycerin.  No recent NTG use. He may be heading  towards palliative care.   Yates Decamp, MD, [facility] 06/19/2020, 12:11  PM Office: [phone]

## 2020-06-20 ENCOUNTER — Ambulatory Visit
Admission: RE | Admit: 2020-06-20 | Discharge: 2020-06-20 | Disposition: A | Discharge status: Home / Self Care | Payer: Medicare HMO | Source: Ambulatory Visit | Attending: Cardiology | Admitting: Cardiology

## 2020-06-20 DIAGNOSIS — R0602 Shortness of breath: Secondary | ICD-10-CM | POA: Diagnosis not present

## 2020-06-20 DIAGNOSIS — R05 Cough: Secondary | ICD-10-CM | POA: Diagnosis not present

## 2020-06-20 DIAGNOSIS — R059 Cough, unspecified: Secondary | ICD-10-CM

## 2020-07-03 ENCOUNTER — Encounter: Payer: Self-pay | Admitting: Nurse Practitioner

## 2020-07-05 ENCOUNTER — Other Ambulatory Visit: Payer: Self-pay

## 2020-07-05 MED ORDER — NITROGLYCERIN 0.4 MG SL SUBL: 0.4000 mg | SUBLINGUAL_TABLET | SUBLINGUAL | 12 refills | Status: AC | PRN

## 2020-07-09 DIAGNOSIS — I5020 Unspecified systolic (congestive) heart failure: Secondary | ICD-10-CM | POA: Diagnosis not present

## 2020-08-08 ENCOUNTER — Other Ambulatory Visit: Payer: Self-pay | Admitting: Cardiology

## 2020-08-09 DIAGNOSIS — I5020 Unspecified systolic (congestive) heart failure: Secondary | ICD-10-CM | POA: Diagnosis not present

## 2020-08-12 ENCOUNTER — Other Ambulatory Visit: Payer: Self-pay | Admitting: Cardiology

## 2020-08-12 DIAGNOSIS — I10 Essential (primary) hypertension: Secondary | ICD-10-CM

## 2020-08-26 ENCOUNTER — Other Ambulatory Visit: Payer: Self-pay | Admitting: Cardiology

## 2020-08-26 DIAGNOSIS — I10 Essential (primary) hypertension: Secondary | ICD-10-CM

## 2020-09-08 DIAGNOSIS — I5020 Unspecified systolic (congestive) heart failure: Secondary | ICD-10-CM | POA: Diagnosis not present

## 2020-09-09 ENCOUNTER — Emergency Department (HOSPITAL_COMMUNITY): Payer: Medicare HMO

## 2020-09-09 ENCOUNTER — Ambulatory Visit (INDEPENDENT_AMBULATORY_CARE_PROVIDER_SITE_OTHER): Payer: Medicare HMO | Admitting: Nurse Practitioner

## 2020-09-09 ENCOUNTER — Encounter (HOSPITAL_COMMUNITY): Payer: Self-pay

## 2020-09-09 ENCOUNTER — Other Ambulatory Visit: Payer: Self-pay

## 2020-09-09 ENCOUNTER — Telehealth: Payer: Self-pay

## 2020-09-09 ENCOUNTER — Inpatient Hospital Stay (HOSPITAL_COMMUNITY)
Admission: EM | Admit: 2020-09-09 | Discharge: 2020-09-23 | DRG: 071 | Disposition: E | Payer: Medicare HMO | Attending: Internal Medicine | Admitting: Internal Medicine

## 2020-09-09 ENCOUNTER — Encounter: Payer: Self-pay | Admitting: Nurse Practitioner

## 2020-09-09 VITALS — BP 130/80 | HR 86

## 2020-09-09 DIAGNOSIS — R42 Dizziness and giddiness: Secondary | ICD-10-CM | POA: Diagnosis not present

## 2020-09-09 DIAGNOSIS — G9340 Encephalopathy, unspecified: Secondary | ICD-10-CM | POA: Diagnosis present

## 2020-09-09 DIAGNOSIS — Z515 Encounter for palliative care: Secondary | ICD-10-CM

## 2020-09-09 DIAGNOSIS — R404 Transient alteration of awareness: Secondary | ICD-10-CM | POA: Diagnosis not present

## 2020-09-09 DIAGNOSIS — R0689 Other abnormalities of breathing: Secondary | ICD-10-CM | POA: Diagnosis not present

## 2020-09-09 DIAGNOSIS — I5082 Biventricular heart failure: Secondary | ICD-10-CM | POA: Diagnosis present

## 2020-09-09 DIAGNOSIS — R410 Disorientation, unspecified: Secondary | ICD-10-CM | POA: Diagnosis not present

## 2020-09-09 DIAGNOSIS — G9341 Metabolic encephalopathy: Secondary | ICD-10-CM | POA: Diagnosis not present

## 2020-09-09 DIAGNOSIS — D631 Anemia in chronic kidney disease: Secondary | ICD-10-CM | POA: Diagnosis present

## 2020-09-09 DIAGNOSIS — E860 Dehydration: Secondary | ICD-10-CM

## 2020-09-09 DIAGNOSIS — R000 Tachycardia, unspecified: Secondary | ICD-10-CM | POA: Diagnosis not present

## 2020-09-09 DIAGNOSIS — K8689 Other specified diseases of pancreas: Secondary | ICD-10-CM | POA: Diagnosis not present

## 2020-09-09 DIAGNOSIS — Z87891 Personal history of nicotine dependence: Secondary | ICD-10-CM

## 2020-09-09 DIAGNOSIS — I2510 Atherosclerotic heart disease of native coronary artery without angina pectoris: Secondary | ICD-10-CM | POA: Diagnosis not present

## 2020-09-09 DIAGNOSIS — N179 Acute kidney failure, unspecified: Secondary | ICD-10-CM | POA: Diagnosis present

## 2020-09-09 DIAGNOSIS — I130 Hypertensive heart and chronic kidney disease with heart failure and stage 1 through stage 4 chronic kidney disease, or unspecified chronic kidney disease: Secondary | ICD-10-CM | POA: Diagnosis present

## 2020-09-09 DIAGNOSIS — N400 Enlarged prostate without lower urinary tract symptoms: Secondary | ICD-10-CM | POA: Diagnosis present

## 2020-09-09 DIAGNOSIS — K6289 Other specified diseases of anus and rectum: Secondary | ICD-10-CM | POA: Diagnosis present

## 2020-09-09 DIAGNOSIS — I351 Nonrheumatic aortic (valve) insufficiency: Secondary | ICD-10-CM | POA: Diagnosis present

## 2020-09-09 DIAGNOSIS — I5042 Chronic combined systolic (congestive) and diastolic (congestive) heart failure: Secondary | ICD-10-CM | POA: Diagnosis present

## 2020-09-09 DIAGNOSIS — N182 Chronic kidney disease, stage 2 (mild): Secondary | ICD-10-CM | POA: Diagnosis present

## 2020-09-09 DIAGNOSIS — J90 Pleural effusion, not elsewhere classified: Secondary | ICD-10-CM | POA: Diagnosis not present

## 2020-09-09 DIAGNOSIS — R7881 Bacteremia: Secondary | ICD-10-CM | POA: Diagnosis present

## 2020-09-09 DIAGNOSIS — N490 Inflammatory disorders of seminal vesicle: Secondary | ICD-10-CM | POA: Diagnosis present

## 2020-09-09 DIAGNOSIS — R54 Age-related physical debility: Secondary | ICD-10-CM | POA: Diagnosis present

## 2020-09-09 DIAGNOSIS — Z20822 Contact with and (suspected) exposure to covid-19: Secondary | ICD-10-CM | POA: Diagnosis present

## 2020-09-09 DIAGNOSIS — I10 Essential (primary) hypertension: Secondary | ICD-10-CM | POA: Diagnosis present

## 2020-09-09 DIAGNOSIS — R935 Abnormal findings on diagnostic imaging of other abdominal regions, including retroperitoneum: Secondary | ICD-10-CM

## 2020-09-09 DIAGNOSIS — I517 Cardiomegaly: Secondary | ICD-10-CM | POA: Diagnosis not present

## 2020-09-09 DIAGNOSIS — E870 Hyperosmolality and hypernatremia: Secondary | ICD-10-CM | POA: Diagnosis not present

## 2020-09-09 DIAGNOSIS — I700 Atherosclerosis of aorta: Secondary | ICD-10-CM | POA: Diagnosis present

## 2020-09-09 DIAGNOSIS — K529 Noninfective gastroenteritis and colitis, unspecified: Secondary | ICD-10-CM | POA: Diagnosis not present

## 2020-09-09 DIAGNOSIS — I452 Bifascicular block: Secondary | ICD-10-CM | POA: Diagnosis present

## 2020-09-09 DIAGNOSIS — Z955 Presence of coronary angioplasty implant and graft: Secondary | ICD-10-CM

## 2020-09-09 DIAGNOSIS — E785 Hyperlipidemia, unspecified: Secondary | ICD-10-CM | POA: Diagnosis present

## 2020-09-09 DIAGNOSIS — B965 Pseudomonas (aeruginosa) (mallei) (pseudomallei) as the cause of diseases classified elsewhere: Secondary | ICD-10-CM | POA: Diagnosis present

## 2020-09-09 DIAGNOSIS — I440 Atrioventricular block, first degree: Secondary | ICD-10-CM | POA: Diagnosis present

## 2020-09-09 DIAGNOSIS — R4182 Altered mental status, unspecified: Secondary | ICD-10-CM | POA: Diagnosis not present

## 2020-09-09 DIAGNOSIS — E875 Hyperkalemia: Secondary | ICD-10-CM | POA: Diagnosis present

## 2020-09-09 DIAGNOSIS — R0902 Hypoxemia: Secondary | ICD-10-CM | POA: Diagnosis not present

## 2020-09-09 DIAGNOSIS — N200 Calculus of kidney: Secondary | ICD-10-CM | POA: Diagnosis not present

## 2020-09-09 DIAGNOSIS — E861 Hypovolemia: Secondary | ICD-10-CM | POA: Diagnosis present

## 2020-09-09 DIAGNOSIS — I4891 Unspecified atrial fibrillation: Secondary | ICD-10-CM | POA: Diagnosis not present

## 2020-09-09 DIAGNOSIS — R531 Weakness: Secondary | ICD-10-CM | POA: Diagnosis not present

## 2020-09-09 DIAGNOSIS — I252 Old myocardial infarction: Secondary | ICD-10-CM

## 2020-09-09 DIAGNOSIS — I5032 Chronic diastolic (congestive) heart failure: Secondary | ICD-10-CM | POA: Diagnosis present

## 2020-09-09 DIAGNOSIS — M47812 Spondylosis without myelopathy or radiculopathy, cervical region: Secondary | ICD-10-CM | POA: Diagnosis not present

## 2020-09-09 DIAGNOSIS — E878 Other disorders of electrolyte and fluid balance, not elsewhere classified: Secondary | ICD-10-CM | POA: Diagnosis not present

## 2020-09-09 DIAGNOSIS — Z823 Family history of stroke: Secondary | ICD-10-CM

## 2020-09-09 DIAGNOSIS — M4312 Spondylolisthesis, cervical region: Secondary | ICD-10-CM | POA: Diagnosis not present

## 2020-09-09 DIAGNOSIS — H409 Unspecified glaucoma: Secondary | ICD-10-CM | POA: Diagnosis present

## 2020-09-09 DIAGNOSIS — K6389 Other specified diseases of intestine: Secondary | ICD-10-CM | POA: Diagnosis not present

## 2020-09-09 DIAGNOSIS — J9811 Atelectasis: Secondary | ICD-10-CM | POA: Diagnosis not present

## 2020-09-09 DIAGNOSIS — Z66 Do not resuscitate: Secondary | ICD-10-CM | POA: Diagnosis present

## 2020-09-09 DIAGNOSIS — I480 Paroxysmal atrial fibrillation: Secondary | ICD-10-CM | POA: Diagnosis present

## 2020-09-09 LAB — COMPREHENSIVE METABOLIC PANEL
ALT: 33 U/L (ref 0–44)
AST: 51 U/L — ABNORMAL HIGH (ref 15–41)
Albumin: 3.9 g/dL (ref 3.5–5.0)
Alkaline Phosphatase: 79 U/L (ref 38–126)
Anion gap: 18 — ABNORMAL HIGH (ref 5–15)
BUN: 52 mg/dL — ABNORMAL HIGH (ref 8–23)
CO2: 18 mmol/L — ABNORMAL LOW (ref 22–32)
Calcium: 9.4 mg/dL (ref 8.9–10.3)
Chloride: 107 mmol/L (ref 98–111)
Creatinine, Ser: 1.75 mg/dL — ABNORMAL HIGH (ref 0.61–1.24)
GFR, Estimated: 34 mL/min — ABNORMAL LOW (ref >60)
Glucose, Bld: 165 mg/dL — ABNORMAL HIGH (ref 70–99)
Potassium: 5.3 mmol/L — ABNORMAL HIGH (ref 3.5–5.1)
Sodium: 143 mmol/L (ref 135–145)
Total Bilirubin: 2.3 mg/dL — ABNORMAL HIGH (ref 0.3–1.2)
Total Protein: 6.9 g/dL (ref 6.5–8.1)

## 2020-09-09 LAB — CBC
HCT: 42.8 % (ref 39.0–52.0)
Hemoglobin: 12.9 g/dL — ABNORMAL LOW (ref 13.0–17.0)
MCH: 25.7 pg — ABNORMAL LOW (ref 26.0–34.0)
MCHC: 30.1 g/dL (ref 30.0–36.0)
MCV: 85.3 fL (ref 80.0–100.0)
Platelets: 175 10*3/uL (ref 150–400)
RBC: 5.02 MIL/uL (ref 4.22–5.81)
RDW: 17 % — ABNORMAL HIGH (ref 11.5–15.5)
WBC: 10.5 10*3/uL (ref 4.0–10.5)
nRBC: 0.3 % — ABNORMAL HIGH (ref 0.0–0.2)

## 2020-09-09 LAB — URINALYSIS, ROUTINE W REFLEX MICROSCOPIC
Bilirubin Urine: NEGATIVE
Glucose, UA: NEGATIVE mg/dL
Ketones, ur: 5 mg/dL — AB
Leukocytes,Ua: NEGATIVE
Nitrite: NEGATIVE
Protein, ur: 100 mg/dL — AB
RBC / HPF: >50 RBC/hpf — ABNORMAL HIGH (ref 0–5)
Specific Gravity, Urine: 1.021 (ref 1.005–1.030)
pH: 5 (ref 5.0–8.0)

## 2020-09-09 LAB — RESPIRATORY PANEL BY RT PCR (FLU A&B, COVID)
Influenza A by PCR: NEGATIVE
Influenza B by PCR: NEGATIVE
SARS Coronavirus 2 by RT PCR: NEGATIVE

## 2020-09-09 LAB — TROPONIN I (HIGH SENSITIVITY)
Troponin I (High Sensitivity): 57 ng/L — ABNORMAL HIGH (ref <18)
Troponin I (High Sensitivity): 49 ng/L — ABNORMAL HIGH (ref <18)

## 2020-09-09 LAB — BLOOD GAS, VENOUS
Acid-base deficit: 8.1 mmol/L — ABNORMAL HIGH (ref 0.0–2.0)
Bicarbonate: 16.3 mmol/L — ABNORMAL LOW (ref 20.0–28.0)
O2 Saturation: 94.4 %
Patient temperature: 98.6
pCO2, Ven: 31.2 mmHg — ABNORMAL LOW (ref 44.0–60.0)
pH, Ven: 7.337 (ref 7.250–7.430)
pO2, Ven: 86.1 mmHg — ABNORMAL HIGH (ref 32.0–45.0)

## 2020-09-09 LAB — CK: Total CK: 794 U/L — ABNORMAL HIGH (ref 49–397)

## 2020-09-09 LAB — LACTIC ACID, PLASMA: Lactic Acid, Venous: 3.4 mmol/L (ref 0.5–1.9)

## 2020-09-09 LAB — CBG MONITORING, ED: Glucose-Capillary: 158 mg/dL — ABNORMAL HIGH (ref 70–99)

## 2020-09-09 MED ORDER — SODIUM CHLORIDE 0.9 % IV SOLN
2.0000 g | INTRAVENOUS | Status: DC
  Administered 2020-09-10: 2 g via INTRAVENOUS
  Filled 2020-09-09: qty 2

## 2020-09-09 MED ORDER — SODIUM CHLORIDE 0.9 % IV BOLUS
1000.0000 mL | Freq: Once | INTRAVENOUS | Status: AC
  Administered 2020-09-09: 1000 mL via INTRAVENOUS

## 2020-09-09 MED ORDER — HALOPERIDOL LACTATE 5 MG/ML IJ SOLN
5.0000 mg | Freq: Once | INTRAMUSCULAR | Status: AC
  Administered 2020-09-09: 5 mg via INTRAMUSCULAR
  Filled 2020-09-09: qty 1

## 2020-09-09 MED ORDER — SODIUM CHLORIDE 0.9 % IV SOLN
2.0000 g | Freq: Once | INTRAVENOUS | Status: AC
  Administered 2020-09-09: 2 g via INTRAVENOUS
  Filled 2020-09-09: qty 20

## 2020-09-09 MED ORDER — SODIUM CHLORIDE 0.9% FLUSH
3.0000 mL | Freq: Two times a day (BID) | INTRAVENOUS | Status: DC
  Administered 2020-09-10 – 2020-09-11 (×2): 3 mL via INTRAVENOUS

## 2020-09-09 MED ORDER — ACETAMINOPHEN 325 MG PO TABS
650.0000 mg | ORAL_TABLET | Freq: Four times a day (QID) | ORAL | Status: DC | PRN
  Filled 2020-09-09: qty 2

## 2020-09-09 MED ORDER — ONDANSETRON HCL 4 MG/2ML IJ SOLN: 4.0000 mg | Freq: Four times a day (QID) | INTRAMUSCULAR | Status: DC | PRN

## 2020-09-09 MED ORDER — LORAZEPAM 2 MG/ML IJ SOLN
1.0000 mg | Freq: Once | INTRAMUSCULAR | Status: AC
  Administered 2020-09-09: 1 mg via INTRAVENOUS
  Filled 2020-09-09: qty 1

## 2020-09-09 MED ORDER — METRONIDAZOLE IN NACL 5-0.79 MG/ML-% IV SOLN
500.0000 mg | Freq: Once | INTRAVENOUS | Status: AC
  Administered 2020-09-09: 500 mg via INTRAVENOUS
  Filled 2020-09-09: qty 100

## 2020-09-09 MED ORDER — HEPARIN SODIUM (PORCINE) 5000 UNIT/ML IJ SOLN
5000.0000 [IU] | Freq: Three times a day (TID) | INTRAMUSCULAR | Status: DC
  Administered 2020-09-10 – 2020-09-11 (×5): 5000 [IU] via SUBCUTANEOUS
  Filled 2020-09-09 (×5): qty 1

## 2020-09-09 MED ORDER — ONDANSETRON HCL 4 MG PO TABS: 4.0000 mg | ORAL_TABLET | Freq: Four times a day (QID) | ORAL | Status: DC | PRN

## 2020-09-09 MED ORDER — LACTATED RINGERS IV SOLN: INTRAVENOUS | Status: DC

## 2020-09-09 MED ORDER — METRONIDAZOLE IN NACL 5-0.79 MG/ML-% IV SOLN
500.0000 mg | Freq: Three times a day (TID) | INTRAVENOUS | Status: DC
  Administered 2020-09-10 – 2020-09-11 (×4): 500 mg via INTRAVENOUS
  Filled 2020-09-09 (×4): qty 100

## 2020-09-09 MED ORDER — ACETAMINOPHEN 650 MG RE SUPP: 650.0000 mg | Freq: Four times a day (QID) | RECTAL | Status: DC | PRN

## 2020-09-09 NOTE — ED Triage Notes (Signed)
EMS reports from home, family states increasing generalized weakness and AMS x a week, states Pt can normally ambulate with assist. Chronic hx of UTI   BP 145 HR 80 Sp02 98 RA  RR 25 CBG 179

## 2020-09-09 NOTE — Telephone Encounter (Signed)
3 attempts were made to reach patient by phone for telephone visit scheduled at 2:15 pm  1st attempt 2:15 pm 2nd attempt 2:25 pm Final attempt 2:40 pm  After final attempt I informed patients daughter in message to please call and reschedule.

## 2020-09-09 NOTE — Progress Notes (Signed)
This service is provided via telemedicine  No vital signs collected/recorded due to the encounter was a telemedicine visit.   Location of patient (ex: home, work):  Home  Patient consents to a telephone visit:  Yes, see telephone encounter dated 09-27-2020 with annual consent   Location of the provider (ex: office, home):  [facility], Office   Name of any referring provider:  N/A  Names of all persons participating in the telemedicine service and their role in the encounter:  S.Chrae B/CMA, Abbey Chatters, NP, Dondra Spry (daughter) and Patient   Time spent on call:  8 min with medical assistant       Careteam: Patient Care Team: Sharon Seller, NP as PCP - General (Geriatric Medicine) Mateo Flow, MD as Consulting Physician (Ophthalmology) Yates Decamp, MD as Consulting Physician (Cardiology)  Advanced Directive information    No Known Allergies  Chief Complaint  Patient presents with  . Acute Visit    Decreased appetite x 2 days.Sttaff from the [employer] (friend of daughter) assessed patient todayTelephone visit.      HPI: Patient is a 86 y.o. male connected via video visit   Daughter provided hx.  Reports she took him on a trip and did not do well but was stable in august. His memory was worse on the trip. When he got home things improved slightly but still confused.   Daughter states she thought he was dying. He has gone downhill in the last 3 days. He is very weak. He has lost a lot of weight.  Daughter reports he is gasping for air.  Refusing water  Appears to be in pain and increase confusion with delirium.  Daughter wants pt to stay in the home if all possible. Goal is comfort.  When she ask him what is hurting he says "everything" Constantly picking his finger.  Hands are in balls.  The fire department checked his O2 79% but before they left 91%.  Constantly laying down and when he's sitting he is looking at  the floor so daughter did not feel like it was safe to sit him up.  He has not drank anything in 2 days.   He has O2 but will not wear it.    Incontinent of urine and bowel since august. Since august there was odd behavior.    Review of Systems:  Review of Systems  Unable to perform ROS: Acuity of condition    Past Medical History:  Diagnosis Date  . Abnormality of gait   . Actinic keratosis   . Cognitive deficits, late effect of cerebrovascular disease   . Contact dermatitis and other eczema due to other specified agent   . Dizziness and giddiness   . Elevated prostate specific antigen (PSA)   . Essential and other specified forms of tremor   . First degree atrioventricular block   . Gastric ulcer, unspecified as acute or chronic, without mention of hemorrhage, perforation, or obstruction   . Hypertrophy of prostate without urinary obstruction and other lower urinary tract symptoms (LUTS)   . Impotence of organic origin   . Insomnia, unspecified   . Obesity, unspecified   . Other abnormal blood chemistry   . Pain in joint, lower leg   . STEMI (ST elevation myocardial infarction) (HCC) 05/07/2014  . Unspecified essential hypertension   . Unspecified glaucoma(365.9)   . Unspecified vitamin D deficiency   . Urge incontinence   . Urinary frequency  Past Surgical History:  Procedure Laterality Date  . APPENDECTOMY  1930's?  Marland Kitchen CATARACT EXTRACTION W/ INTRAOCULAR LENS  IMPLANT, BILATERAL Bilateral   . CORONARY ANGIOPLASTY WITH STENT PLACEMENT  05/07/2014   "1"  . CYST EXCISION Left 1977   "knee"  . LEFT HEART CATHETERIZATION WITH CORONARY ANGIOGRAM N/A 05/07/2014   Procedure: LEFT HEART CATHETERIZATION WITH CORONARY ANGIOGRAM;  Surgeon: Pamella Pert, MD;  Location: [facility] CATH LAB;  Service: Cardiovascular;  Laterality: N/A;  . MULTIPLE TOOTH EXTRACTIONS    . TRANSURETHRAL RESECTION OF PROSTATE  1987   Social History:   reports that he has never smoked. His smokeless  tobacco use includes chew. He reports that he does not drink alcohol and does not use drugs.  Family History  Problem Relation Age of Onset  . Stroke Mother   . Stroke Sister   . Stroke Brother     Medications: Patient's Medications  New Prescriptions   No medications on file  Previous Medications   ATORVASTATIN (LIPITOR) 10 MG TABLET    TAKE 1 TABLET BY MOUTH EVERY DAY   FINASTERIDE (PROSCAR) 5 MG TABLET    TAKE 1 TABLET BY MOUTH EVERY DAY   ISOSORBIDE MONONITRATE (IMDUR) 60 MG 24 HR TABLET    TAKE 1 TABLET BY MOUTH EVERY DAY   LATANOPROST (XALATAN) 0.005 % OPHTHALMIC SOLUTION    Place 1 drop into both eyes at bedtime.    NITROGLYCERIN (NITROSTAT) 0.4 MG SL TABLET    Place 1 tablet (0.4 mg total) under the tongue every 5 (five) minutes as needed for chest pain.   SPIRONOLACTONE (ALDACTONE) 25 MG TABLET    Take 0.5 tablets (12.5 mg total) by mouth daily.   VALSARTAN (DIOVAN) 160 MG TABLET    Take 1 tablet (160 mg total) by mouth daily.  Modified Medications   No medications on file  Discontinued Medications   NIFEDIPINE (PROCARDIA XL/NIFEDICAL-XL) 90 MG 24 HR TABLET    TAKE 1 TABLET BY MOUTH EVERY DAY   TRIAMCINOLONE CREAM (KENALOG) 0.1 %    Apply sparingly up to twice daily to scaley areas on hands    Physical Exam:  Vitals:   09/19/2020 1528  BP: 130/80  Pulse: 86  SpO2: 91%   There is no height or weight on file to calculate BMI. Wt Readings from Last 3 Encounters:  06/19/20 163 lb (73.9 kg)  05/08/20 166 lb (75.3 kg)  04/16/20 170 lb (77.1 kg)   Labs reviewed: Basic Metabolic Panel: Recent Labs    12/11/19 0000  NA 140  K 4.2  CL 108  CO2 21  GLUCOSE 110*  BUN 17  CREATININE 1.07  CALCIUM 9.1   Liver Function Tests: Recent Labs    12/11/19 0000  AST 9*  ALT 8*  BILITOT 0.7  PROT 6.1   Recent Labs    12/11/19 0000  LIPASE 24  AMYLASE 30   No results for input(s): AMMONIA in the last 8760 hours. CBC: Recent Labs    12/11/19 0000  04/16/20 0912  WBC 6.5 7.3  NEUTROABS 4,518 4,497  HGB 12.1* 11.5*  HCT 36.5* 34.7*  MCV 84.1 84.2  PLT 156 159   Lipid Panel: No results for input(s): CHOL, HDL, LDLCALC, TRIG, CHOLHDL, LDLDIRECT in the last 8760 hours. TSH: No results for input(s): TSH in the last 8760 hours. A1C: Lab Results  Component Value Date   HGBA1C 6.1 (H) 12/11/2019     Assessment/Plan 1. Delirium with dementia Has had a  progressive decline over the last several months but in the last 3 days with acute change. Not eating or drinking at this time. He is a DNR, DNI but Daughter would like treatment for any reversible conditions that can be treated.  Pt is unable to get up or in the care due to weakness and state at this time. Due to the severity of illness at this time recommendation were to go to the ED via EMS for further evaluation, lab and imaging. Daughter in agreement.    Janene Harvey. Biagio Borg  [facility] [phone]    Virtual Visit via Earleen Reaper  I connected with patient on 09/21/2020 at  2:15 PM EDT by video and verified that I am speaking with the correct person using two identifiers.  Location: Patient: home Provider: [facility]   I discussed the limitations, risks, security and privacy concerns of performing an evaluation and management service by telephone and the availability of in person appointments. I also discussed with the patient that there may be a patient responsible charge related to this service. The patient expressed understanding and agreed to proceed.   I discussed the assessment and treatment plan with the patient. The patient was provided an opportunity to ask questions and all were answered. The patient agreed with the plan and demonstrated an understanding of the instructions.   The patient was advised to call back or seek an in-person evaluation if the symptoms worsen or if the condition fails to improve as anticipated.  I provided 25 minutes  of non-face-to-face time during this encounter.  Janene Harvey. Biagio Borg Avs printed and mailed

## 2020-09-09 NOTE — H&P (Signed)
History and Physical    John Mason UXL:[id number] DOB: Apr 10, 1929 DOA: 09/22/2020  PCP: Lauree Chandler, NP  Patient coming from: Home via EMS  I have personally briefly reviewed patient's old medical records in [place]  Chief Complaint: Altered mental status, generalized weakness  HPI: John Mason is a 86 y.o. male with medical history significant for CAD s/p PCI, chronic diastolic CHF (EF 53%, G2 DD on TTE 04/24/2020), paroxysmal atrial fibrillation not on anticoagulation due to GI bleeding, severe aortic regurgitation, HTN, HLD, BPH who presents to the ED for evaluation of progressive altered mental status and generalized weakness.  Patient is unable to provide history due to somnolence therefore majority of history is obtained from daughter at bedside, EDP, and chart review.  Daughter states that over the last several months patient has had some memory deficits however has been otherwise functioning well.  She says he has chronic loose stools however about a week ago he had significantly more frequent diarrhea.  Over the last 2 days he has had a significant change in which he has been very fatigued.  He has been staying in bed mostly, only occasionally getting up into a chair.  He has not been eating and therefore has not had any further bowel movements last 2 days.  He has been much less interactive and not communicating as he normally has.  He has been generally weak with a fall at home.  He did not have any significant injury.   He has not been communicating his needs and only stated that he hurt all over.  Daughter states that he was recently given home oxygen to use at night, however patient says he has not wanted to use it.  Daughter says that he was very agitated on arrival to the ED however is now resting peacefully after receiving medications for anxiety/agitation.  ED Course:  Initial vitals showed BP 141/97, pulse 96, RR 20, temp 97.9 Fahrenheit, SPO2 98% on room  air.  Labs show WBC 10.5, hemoglobin 12.9, platelets 175,000, sodium 143, potassium 5.3, bicarb 18, BUN 52, creatinine 1.75, serum glucose 165, AST 51, ALT 33, alk phos 79, total bilirubin 2.3, CK 794 high-sensitivity troponin I 57 > 49, lactic acid 3.4.  Urinalysis showed negative nitrites, negative leukocytes, >50 RBC/hpf, 0-5 WBC/hpf, rare bacteria microscopy.  VBG showed pH 7.337, PCO2 31.2, PO2 86.1.  Portable chest x-ray showed mild bibasilar atelectasis, chronic appearing interstitial lung markings, and cardiomegaly.  CT head without contrast showed atrophy and chronic microvascular disease without acute intracranial abnormality.  CT cervical spine without contrast showed diffuse degenerative disc and facet disease without acute bony abnormality.  CT chest/abdomen/pelvis without contrast showed irregular wall thickening at the junction of the sigmoid colon and rectum over 7 cm segment indeterminate for tumor versus proctitis.  Cardiomegaly with small/trace bilateral pleural effusions noted.  Bilateral pulmonary reticular and groundglass opacities also seen felt to reflect interstitial edema versus atelectasis superimposed on degree of chronic lung disease.  No other acute or inflammatory process identified in the chest/abdomen/pelvis.  Patient was given IV ceftriaxone and Flagyl.  Patient was agitated in the ED and received 1 mg IV Ativan and 5 mg IM Haldol.  He also received 1 L normal saline.  The hospitalist service was consulted to admit for further evaluation and management. Review of Systems:  Unable to obtain full review of systems due to altered mental status/somnolence.   Past Medical History:  Diagnosis Date  . Abnormality of  gait   . Actinic keratosis   . Cognitive deficits, late effect of cerebrovascular disease   . Contact dermatitis and other eczema due to other specified agent   . Dizziness and giddiness   . Elevated prostate specific antigen (PSA)   . Essential and  other specified forms of tremor   . First degree atrioventricular block   . Gastric ulcer, unspecified as acute or chronic, without mention of hemorrhage, perforation, or obstruction   . Hypertrophy of prostate without urinary obstruction and other lower urinary tract symptoms (LUTS)   . Impotence of organic origin   . Insomnia, unspecified   . Obesity, unspecified   . Other abnormal blood chemistry   . Pain in joint, lower leg   . STEMI (ST elevation myocardial infarction) ([place]) 05/07/2014  . Unspecified essential hypertension   . Unspecified glaucoma(365.9)   . Unspecified vitamin D deficiency   . Urge incontinence   . Urinary frequency     Past Surgical History:  Procedure Laterality Date  . APPENDECTOMY  1930's?  Marland Kitchen CATARACT EXTRACTION W/ INTRAOCULAR LENS  IMPLANT, BILATERAL Bilateral   . CORONARY ANGIOPLASTY WITH STENT PLACEMENT  05/07/2014   "1"  . CYST EXCISION Left 1977   "knee"  . LEFT HEART CATHETERIZATION WITH CORONARY ANGIOGRAM N/A 05/07/2014   Procedure: LEFT HEART CATHETERIZATION WITH CORONARY ANGIOGRAM;  Surgeon: Laverda Page, MD;  Location: [facility] CATH LAB;  Service: Cardiovascular;  Laterality: N/A;  . MULTIPLE TOOTH EXTRACTIONS    . TRANSURETHRAL RESECTION OF PROSTATE  1987    Social History:  reports that he has never smoked. His smokeless tobacco use includes chew. He reports that he does not drink alcohol and does not use drugs.  No Known Allergies  Family History  Problem Relation Age of Onset  . Stroke Mother   . Stroke Sister   . Stroke Brother      Prior to Admission medications   Medication Sig Start Date End Date Taking? Authorizing Provider  atorvastatin (LIPITOR) 10 MG tablet TAKE 1 TABLET BY MOUTH EVERY DAY 08/09/20  Yes Adrian Prows, MD  finasteride (PROSCAR) 5 MG tablet TAKE 1 TABLET BY MOUTH EVERY DAY 06/05/20  Yes Lauree Chandler, NP  isosorbide mononitrate (IMDUR) 60 MG 24 hr tablet TAKE 1 TABLET BY MOUTH EVERY DAY 02/26/20  Yes Miquel Dunn, NP  latanoprost (XALATAN) 0.005 % ophthalmic solution Place 1 drop into both eyes at bedtime.  03/12/13  Yes [provider]  nitroGLYCERIN (NITROSTAT) 0.4 MG SL tablet Place 1 tablet (0.4 mg total) under the tongue every 5 (five) minutes as needed for chest pain. 07/05/20  Yes Adrian Prows, MD  spironolactone (ALDACTONE) 25 MG tablet Take 0.5 tablets (12.5 mg total) by mouth daily. 04/16/20  Yes Ngetich, Dinah C, NP  valsartan (DIOVAN) 160 MG tablet Take 1 tablet (160 mg total) by mouth daily. Patient taking differently: Take 320 mg by mouth daily.  05/08/20  Yes Adrian Prows, MD    Physical Exam: Vitals:   08/26/2020 2003 08/28/2020 2130 08/28/2020 2300 09/19/2020 2330  BP:  (!) 160/47 (!) 164/44 (!) 164/43  Pulse:  72 91 66  Resp:  _0 Temp: 99.2 F (37.3 C)     TempSrc: Rectal     SpO2:  100% 96% 97%  Limited due to somnolence. Constitutional: Chronically ill-appearing elderly man resting supine in bed.  Somnolent and difficult to arouse.  Appears comfortable.  Intermittent apneic breathing pattern. Eyes: PERRL, lids and  conjunctivae normal ENMT: Mucous membranes are dry. Posterior pharynx clear of any exudate or lesions.edentulous. Neck: normal, supple, no masses. Respiratory: clear to auscultation anteriorly.  Normal respiratory effort. No accessory muscle use.  Cardiovascular: Irregularly irregular, no murmurs / rubs / gallops. No extremity edema. 2+ pedal pulses. Abdomen: no tenderness, no masses palpated. Bowel sounds diminished.  Musculoskeletal: no clubbing / cyanosis. No joint deformity upper and lower extremities. Good ROM, no contractures. Normal muscle tone.  Skin: no rashes, lesions, ulcers. No induration Neurologic: Limited due to somnolence.  Sensation appears intact. Psychiatric: Somnolent.  Labs on Admission: I have personally reviewed following labs and imaging studies  CBC: Recent Labs  Lab 09/04/2020 1939  WBC 10.5  HGB 12.9*  HCT 42.8  MCV  85.3  PLT 829   Basic Metabolic Panel: Recent Labs  Lab 09/10/2020 1939  NA 143  K 5.3*  CL 107  CO2 18*  GLUCOSE 165*  BUN 52*  CREATININE 1.75*  CALCIUM 9.4   GFR: CrCl cannot be calculated (Unknown ideal weight.). Liver Function Tests: Recent Labs  Lab 09/19/2020 1939  AST 51*  ALT 33  ALKPHOS 79  BILITOT 2.3*  PROT 6.9  ALBUMIN 3.9   No results for input(s): LIPASE, AMYLASE in the last 168 hours. No results for input(s): AMMONIA in the last 168 hours. Coagulation Profile: No results for input(s): INR, PROTIME in the last 168 hours. Cardiac Enzymes: Recent Labs  Lab 09/10/2020 1939  CKTOTAL 794*   BNP (last 3 results) No results for input(s): PROBNP in the last 8760 hours. HbA1C: No results for input(s): HGBA1C in the last 72 hours. CBG: Recent Labs  Lab 09/15/2020 1919  GLUCAP 158*   Lipid Profile: No results for input(s): CHOL, HDL, LDLCALC, TRIG, CHOLHDL, LDLDIRECT in the last 72 hours. Thyroid Function Tests: No results for input(s): TSH, T4TOTAL, FREET4, T3FREE, THYROIDAB in the last 72 hours. Anemia Panel: No results for input(s): VITAMINB12, FOLATE, FERRITIN, TIBC, IRON, RETICCTPCT in the last 72 hours. Urine analysis:    Component Value Date/Time   COLORURINE AMBER (A) 09/12/2020 1905   APPEARANCEUR HAZY (A) 09/08/2020 1905   LABSPEC 1.021 09/18/2020 1905   PHURINE 5.0 09/11/2020 1905   GLUCOSEU NEGATIVE 09/12/2020 1905   HGBUR MODERATE (A) 09/08/2020 1905   BILIRUBINUR NEGATIVE 09/03/2020 1905   BILIRUBINUR neg 05/13/2017 1638   KETONESUR 5 (A) 09/04/2020 1905   PROTEINUR 100 (A) 09/19/2020 1905   UROBILINOGEN 0.2 05/13/2017 1638   UROBILINOGEN 0.2 05/22/2014 2050   NITRITE NEGATIVE 08/30/2020 1905   LEUKOCYTESUR NEGATIVE 09/04/2020 1905    Radiological Exams on Admission: CT ABDOMEN PELVIS WO CONTRAST  Result Date: 09/06/2020 CLINICAL DATA:  86 year old male with weakness, altered mental status. Chest and abdominal pain. EXAM: CT  CHEST, ABDOMEN AND PELVIS WITHOUT CONTRAST TECHNIQUE: Multidetector CT imaging of the chest, abdomen and pelvis was performed following the standard protocol without IV contrast. COMPARISON:  Portable chest radiograph today. FINDINGS: CT CHEST FINDINGS Cardiovascular: Calcified coronary artery and aortic atherosclerosis. Cardiomegaly. No pericardial effusion. Vascular patency is not evaluated in the absence of IV contrast. Mediastinum/Nodes: No mediastinal or hilar lymphadenopathy is evident. Lungs/Pleura: Small layering left pleural effusion with simple fluid density favoring transudate. Trace layering right pleural effusion. Mild respiratory motion. Major airways are patent but with atelectatic changes. Bilateral peripheral and dependent pulmonary reticular and ground-glass opacity. Some associated septal thickening mostly in the lung apices. No consolidation. Musculoskeletal: Osteopenia. Mild T12 superior endplate Schmorl's node. No acute osseous abnormality identified. CT  ABDOMEN PELVIS FINDINGS Hepatobiliary: Negative noncontrast liver and gallbladder. Pancreas: Atrophied. Spleen: Negative. Adrenals/Urinary Tract: Normal adrenal glands. Benign small renal cysts suspected in both kidneys. No hydronephrosis or hydroureter. There is a punctate right lower pole renal calculus on coronal image 80. A small bladder dome diverticulum is suspected on series 3, image 110. Otherwise the bladder is within normal limits. Stomach/Bowel: Low-density retained stool in the rectum. Mild motion artifact in the mid abdomen. There is irregular wall thickening of large bowel at the junction of the sigmoid and rectum (coronal images 68 through 78) measuring up to 15 mm in thickness over a segment of about 7 cm. There is regional dependent presacral stranding although no directly adjacent pericolonic stranding. No regional lymphadenopathy. Redundant sigmoid colon. Decompressed descending colon. Redundant transverse colon with retained  stool. Similar retained stool in the right colon. No other large bowel wall thickening identified. Diminutive or absent appendix. Motion artifact at the ileocecal valve. Terminal ileum appears negative. No dilated small bowel. Decompressed stomach. No free air. No free fluid in the abdomen. No small bowel mesenteric stranding. Vascular/Lymphatic: Aortoiliac calcified atherosclerosis. Normal caliber aorta. Vascular patency is not evaluated in the absence of IV contrast. No lymphadenopathy. Reproductive: Negative. Other: Mild presacral stranding.  No overt pelvic free fluid. Musculoskeletal: Diffuse lumbar vacuum disc. Lumbar scoliosis. Osteopenia. No acute osseous abnormality identified. IMPRESSION: 1. Irregular wall thickening at the junction of the sigmoid colon and rectum over a segment of about 7 cm appears indeterminate for Tumor versus Proctitis. Regional presacral inflammatory stranding. No lymphadenopathy. 2. Cardiomegaly with small layering left and trace right pleural effusions. Bilateral pulmonary reticular and ground-glass opacity could reflect interstitial edema and/or atelectasis superimposed on a degree of chronic lung disease. Viral/atypical respiratory infection is felt less likely. 3. No other acute or inflammatory process identified in the non-contrast chest, abdomen, or pelvis. 4. Calcified coronary artery and Aortic Atherosclerosis (ICD10-I70.0). Right nephrolithiasis. Electronically Signed   By: Genevie Ann M.D.   On: 08/30/2020 21:49   CT HEAD WO CONTRAST  Result Date: 08/25/2020 CLINICAL DATA:  Generalized weakness, altered mental status EXAM: CT HEAD WITHOUT CONTRAST TECHNIQUE: Contiguous axial images were obtained from the base of the skull through the vertex without intravenous contrast. COMPARISON:  None. FINDINGS: Brain: There is atrophy and chronic small vessel disease changes. No acute intracranial abnormality. Specifically, no hemorrhage, hydrocephalus, mass lesion, acute infarction,  or significant intracranial injury. Vascular: No hyperdense vessel or unexpected calcification. Skull: No acute calvarial abnormality. Sinuses/Orbits: Visualized paranasal sinuses and mastoids clear. Orbital soft tissues unremarkable. Other: None IMPRESSION: Atrophy, chronic microvascular disease. No acute intracranial abnormality. Electronically Signed   By: Rolm Baptise M.D.   On: 09/15/2020 21:37   CT Chest Wo Contrast  Result Date: 09/14/2020 CLINICAL DATA:  86 year old male with weakness, altered mental status. Chest and abdominal pain. EXAM: CT CHEST, ABDOMEN AND PELVIS WITHOUT CONTRAST TECHNIQUE: Multidetector CT imaging of the chest, abdomen and pelvis was performed following the standard protocol without IV contrast. COMPARISON:  Portable chest radiograph today. FINDINGS: CT CHEST FINDINGS Cardiovascular: Calcified coronary artery and aortic atherosclerosis. Cardiomegaly. No pericardial effusion. Vascular patency is not evaluated in the absence of IV contrast. Mediastinum/Nodes: No mediastinal or hilar lymphadenopathy is evident. Lungs/Pleura: Small layering left pleural effusion with simple fluid density favoring transudate. Trace layering right pleural effusion. Mild respiratory motion. Major airways are patent but with atelectatic changes. Bilateral peripheral and dependent pulmonary reticular and ground-glass opacity. Some associated septal thickening mostly in the lung apices. No  consolidation. Musculoskeletal: Osteopenia. Mild T12 superior endplate Schmorl's node. No acute osseous abnormality identified. CT ABDOMEN PELVIS FINDINGS Hepatobiliary: Negative noncontrast liver and gallbladder. Pancreas: Atrophied. Spleen: Negative. Adrenals/Urinary Tract: Normal adrenal glands. Benign small renal cysts suspected in both kidneys. No hydronephrosis or hydroureter. There is a punctate right lower pole renal calculus on coronal image 80. A small bladder dome diverticulum is suspected on series 3, image  110. Otherwise the bladder is within normal limits. Stomach/Bowel: Low-density retained stool in the rectum. Mild motion artifact in the mid abdomen. There is irregular wall thickening of large bowel at the junction of the sigmoid and rectum (coronal images 68 through 78) measuring up to 15 mm in thickness over a segment of about 7 cm. There is regional dependent presacral stranding although no directly adjacent pericolonic stranding. No regional lymphadenopathy. Redundant sigmoid colon. Decompressed descending colon. Redundant transverse colon with retained stool. Similar retained stool in the right colon. No other large bowel wall thickening identified. Diminutive or absent appendix. Motion artifact at the ileocecal valve. Terminal ileum appears negative. No dilated small bowel. Decompressed stomach. No free air. No free fluid in the abdomen. No small bowel mesenteric stranding. Vascular/Lymphatic: Aortoiliac calcified atherosclerosis. Normal caliber aorta. Vascular patency is not evaluated in the absence of IV contrast. No lymphadenopathy. Reproductive: Negative. Other: Mild presacral stranding.  No overt pelvic free fluid. Musculoskeletal: Diffuse lumbar vacuum disc. Lumbar scoliosis. Osteopenia. No acute osseous abnormality identified. IMPRESSION: 1. Irregular wall thickening at the junction of the sigmoid colon and rectum over a segment of about 7 cm appears indeterminate for Tumor versus Proctitis. Regional presacral inflammatory stranding. No lymphadenopathy. 2. Cardiomegaly with small layering left and trace right pleural effusions. Bilateral pulmonary reticular and ground-glass opacity could reflect interstitial edema and/or atelectasis superimposed on a degree of chronic lung disease. Viral/atypical respiratory infection is felt less likely. 3. No other acute or inflammatory process identified in the non-contrast chest, abdomen, or pelvis. 4. Calcified coronary artery and Aortic Atherosclerosis  (ICD10-I70.0). Right nephrolithiasis. Electronically Signed   By: Genevie Ann M.D.   On: 09/03/2020 21:49   CT CERVICAL SPINE WO CONTRAST  Result Date: 09/21/2020 CLINICAL DATA:  Generalized weakness, altered mental status EXAM: CT CERVICAL SPINE WITHOUT CONTRAST TECHNIQUE: Multidetector CT imaging of the cervical spine was performed without intravenous contrast. Multiplanar CT image reconstructions were also generated. COMPARISON:  None FINDINGS: Alignment: Slight degenerative retrolisthesis of C4 on C5. Skull base and vertebrae: No acute fracture. No primary bone lesion or focal pathologic process. Soft tissues and spinal canal: No prevertebral fluid or swelling. No visible canal hematoma. Disc levels: Diffuse moderate degenerative disc disease and facet disease bilaterally, left greater than right. Upper chest: Layering bilateral effusions noted. Other: None IMPRESSION: Diffuse degenerative disc and facet disease. No acute bony abnormality. Layering bilateral pleural effusions. Electronically Signed   By: Rolm Baptise M.D.   On: 08/30/2020 21:39   DG Chest Port 1 View  Result Date: 08/27/2020 CLINICAL DATA:  Weakness and altered mental status. EXAM: PORTABLE CHEST 1 VIEW COMPARISON:  None. FINDINGS: Mild, diffuse, chronic appearing increased interstitial lung markings are seen. Mild areas of atelectasis and/or infiltrate are also seen within the bilateral lung bases. There is mild to moderate severity enlargement of the cardiac silhouette. Mild calcification of the thoracic aorta is seen. The visualized skeletal structures are unremarkable. IMPRESSION: 1. Mild bibasilar atelectasis and/or infiltrate. 2. Mild, diffuse, chronic appearing increased interstitial lung markings. 3. Mild to moderate severity cardiomegaly. Electronically Signed   By:  Virgina Norfolk M.D.   On: 09/18/2020 20:13    EKG: Ordered and pending.  Prior EKG showed A. fib with Controlled right and  RBBB/LAFB.  Assessment/Plan Principal Problem:   Acute encephalopathy Active Problems:   Essential hypertension   CAD (coronary artery disease)   AKI (acute kidney injury) (HCC)   HLD (hyperlipidemia)   BPH (benign prostatic hyperplasia)   Paroxysmal atrial fibrillation (HCC)   Chronic combined systolic and diastolic CHF (congestive heart failure) ([place])   Hyperkalemia   Proctitis  KONNOR VONDRASEK is a 86 y.o. male with medical history significant for CAD s/p PCI, chronic diastolic CHF (EF 76%, G2 DD on TTE 04/24/2020), paroxysmal atrial fibrillation not on anticoagulation due to GI bleeding, severe aortic regurgitation, HTN, HLD, BPH who is admitted with altered mental status, AKI, and proctitis.  Proctitis: CT imaging changes suggestive of proctitis versus tumor at the junction of the sigmoid colon and rectum over a segment of about 7 cm.  Lactic acid elevated at 3.4.  Will continue empiric antibiotics for possible proctitis given recent reported history of diarrhea. -Continue empiric IV ceftriaxone and Flagyl for now -Start gentle IV fluid hydration with LR_0  mL/hour overnight -Repeat lactic acid  Acute kidney injury: Likely prerenal from hypovolemia/dehydration in setting of GI losses and associated medication use. -Continue IV fluid hydration overnight as above -Hold home valsartan and spironolactone  Hyperkalemia: Mild in setting of AKI and valsartan/spironolactone use.  Hold these meds and continue IV fluid hydration overnight and repeat labs in a.m.  Acute encephalopathy: Seems to have some cognitive impairment at baseline with significant change 2 days prior to admission.  Suspect related to proctitis, dehydration, AKI.  Continue management as above.  Chronic combined systolic and diastolic CHF: Last EF 22%, G2 DD on TTE 04/24/2020.  Appears hypovolemic at time of admission.  Continue gentle IV fluid hydration as above.  Holding spironolactone and valsartan.  CAD s/p  PCI: Appears stable.  Minimal troponin elevation which has trended down from likely demand ischemia from hypovolemia.  Continue statin.  Not on aspirin due to recent GI bleeding.  Paroxysmal atrial fibrillation: Recently diagnosed.  Not on anticoagulation or aspirin due to recent GI bleeding.  Remains rate controlled.  Continue to monitor.  BPH: Continue Proscar.  Generalized weakness/deconditioning/goals of care: Significant deconditioning and failure to thrive prior to admission.  Potentially secondary to acute illness as above however if not having significant improvement over next 24-48 hours, may be indicating that patient is approaching end-of-life.  Discussed with patient's daughter at bedside.  She says his CODE STATUS is DNR/DNI.  If he were to further decline or not improve with initial management then we should focus on maintaining comfort and quality of life.  They would be interested in pursuing hospice care in this scenario.  DVT prophylaxis: Subcutaneous heparin Code Status: DNR, confirmed with patient's daughter at bedside Family Communication: Discussed with patient's daughter at bedside Disposition Plan: From home, may need SNF versus hospice on discharge pending clinical progress. Consults called: None Admission status:  Status is: Observation  The patient remains OBS appropriate and will d/c before 2 midnights.  Dispo: The patient is from: Home              Anticipated d/c is to: SNF versus hospice pending clinical progress              Anticipated d/c date is: 1 day  Patient currently is not medically stable to d/c.   Zada Finders MD [facility]  If 7PM-7AM, please contact night-coverage [email or web address]  09/10/2020, 12:05 AM

## 2020-09-09 NOTE — ED Notes (Signed)
Date and time results received: 14-Sep-2020 2219 (use smartphrase ".now" to insert current time)  Test: Lactic Acid  Critical Value: 3.4  Name of Provider Notified: Virgel Bouquet, MD   Orders Received? Or Actions Taken?: Orders Received - See Orders for details

## 2020-09-10 ENCOUNTER — Other Ambulatory Visit: Payer: Self-pay

## 2020-09-10 DIAGNOSIS — Z87891 Personal history of nicotine dependence: Secondary | ICD-10-CM | POA: Diagnosis not present

## 2020-09-10 DIAGNOSIS — K6289 Other specified diseases of anus and rectum: Secondary | ICD-10-CM

## 2020-09-10 DIAGNOSIS — G9341 Metabolic encephalopathy: Secondary | ICD-10-CM | POA: Diagnosis not present

## 2020-09-10 DIAGNOSIS — I351 Nonrheumatic aortic (valve) insufficiency: Secondary | ICD-10-CM | POA: Diagnosis present

## 2020-09-10 DIAGNOSIS — I2510 Atherosclerotic heart disease of native coronary artery without angina pectoris: Secondary | ICD-10-CM | POA: Diagnosis not present

## 2020-09-10 DIAGNOSIS — I5042 Chronic combined systolic (congestive) and diastolic (congestive) heart failure: Secondary | ICD-10-CM

## 2020-09-10 DIAGNOSIS — Z7189 Other specified counseling: Secondary | ICD-10-CM

## 2020-09-10 DIAGNOSIS — N179 Acute kidney failure, unspecified: Secondary | ICD-10-CM | POA: Diagnosis not present

## 2020-09-10 DIAGNOSIS — N490 Inflammatory disorders of seminal vesicle: Secondary | ICD-10-CM | POA: Diagnosis present

## 2020-09-10 DIAGNOSIS — G9340 Encephalopathy, unspecified: Secondary | ICD-10-CM | POA: Diagnosis not present

## 2020-09-10 DIAGNOSIS — E875 Hyperkalemia: Secondary | ICD-10-CM

## 2020-09-10 DIAGNOSIS — N400 Enlarged prostate without lower urinary tract symptoms: Secondary | ICD-10-CM

## 2020-09-10 DIAGNOSIS — Z20822 Contact with and (suspected) exposure to covid-19: Secondary | ICD-10-CM | POA: Diagnosis not present

## 2020-09-10 DIAGNOSIS — E785 Hyperlipidemia, unspecified: Secondary | ICD-10-CM | POA: Diagnosis not present

## 2020-09-10 DIAGNOSIS — R194 Change in bowel habit: Secondary | ICD-10-CM

## 2020-09-10 DIAGNOSIS — I480 Paroxysmal atrial fibrillation: Secondary | ICD-10-CM | POA: Diagnosis not present

## 2020-09-10 DIAGNOSIS — N182 Chronic kidney disease, stage 2 (mild): Secondary | ICD-10-CM | POA: Diagnosis present

## 2020-09-10 DIAGNOSIS — Z515 Encounter for palliative care: Secondary | ICD-10-CM | POA: Diagnosis not present

## 2020-09-10 DIAGNOSIS — I10 Essential (primary) hypertension: Secondary | ICD-10-CM

## 2020-09-10 DIAGNOSIS — H409 Unspecified glaucoma: Secondary | ICD-10-CM | POA: Diagnosis present

## 2020-09-10 DIAGNOSIS — R7881 Bacteremia: Secondary | ICD-10-CM | POA: Diagnosis not present

## 2020-09-10 DIAGNOSIS — B965 Pseudomonas (aeruginosa) (mallei) (pseudomallei) as the cause of diseases classified elsewhere: Secondary | ICD-10-CM | POA: Diagnosis present

## 2020-09-10 DIAGNOSIS — R933 Abnormal findings on diagnostic imaging of other parts of digestive tract: Secondary | ICD-10-CM

## 2020-09-10 DIAGNOSIS — R935 Abnormal findings on diagnostic imaging of other abdominal regions, including retroperitoneum: Secondary | ICD-10-CM

## 2020-09-10 DIAGNOSIS — I130 Hypertensive heart and chronic kidney disease with heart failure and stage 1 through stage 4 chronic kidney disease, or unspecified chronic kidney disease: Secondary | ICD-10-CM | POA: Diagnosis not present

## 2020-09-10 DIAGNOSIS — E860 Dehydration: Secondary | ICD-10-CM | POA: Diagnosis not present

## 2020-09-10 DIAGNOSIS — E861 Hypovolemia: Secondary | ICD-10-CM | POA: Diagnosis present

## 2020-09-10 DIAGNOSIS — R4182 Altered mental status, unspecified: Secondary | ICD-10-CM | POA: Diagnosis present

## 2020-09-10 DIAGNOSIS — E870 Hyperosmolality and hypernatremia: Secondary | ICD-10-CM | POA: Diagnosis not present

## 2020-09-10 DIAGNOSIS — Z66 Do not resuscitate: Secondary | ICD-10-CM | POA: Diagnosis not present

## 2020-09-10 DIAGNOSIS — I452 Bifascicular block: Secondary | ICD-10-CM | POA: Diagnosis not present

## 2020-09-10 LAB — BLOOD CULTURE ID PANEL (REFLEXED) - BCID2

## 2020-09-10 LAB — CBC
HCT: 43.5 % (ref 39.0–52.0)
Hemoglobin: 12.8 g/dL — ABNORMAL LOW (ref 13.0–17.0)
MCH: 25.5 pg — ABNORMAL LOW (ref 26.0–34.0)
MCHC: 29.4 g/dL — ABNORMAL LOW (ref 30.0–36.0)
MCV: 86.8 fL (ref 80.0–100.0)
Platelets: 167 10*3/uL (ref 150–400)
RBC: 5.01 MIL/uL (ref 4.22–5.81)
RDW: 16.8 % — ABNORMAL HIGH (ref 11.5–15.5)
WBC: 12.1 10*3/uL — ABNORMAL HIGH (ref 4.0–10.5)
nRBC: 0.2 % (ref 0.0–0.2)

## 2020-09-10 LAB — BASIC METABOLIC PANEL
Anion gap: 14 (ref 5–15)
BUN: 55 mg/dL — ABNORMAL HIGH (ref 8–23)
CO2: 19 mmol/L — ABNORMAL LOW (ref 22–32)
Calcium: 8.9 mg/dL (ref 8.9–10.3)
Chloride: 110 mmol/L (ref 98–111)
Creatinine, Ser: 1.71 mg/dL — ABNORMAL HIGH (ref 0.61–1.24)
GFR, Estimated: 35 mL/min — ABNORMAL LOW (ref >60)
Glucose, Bld: 158 mg/dL — ABNORMAL HIGH (ref 70–99)
Potassium: 5 mmol/L (ref 3.5–5.1)
Sodium: 143 mmol/L (ref 135–145)

## 2020-09-10 LAB — SODIUM, URINE, RANDOM: Sodium, Ur: 40 mmol/L

## 2020-09-10 LAB — CREATININE, URINE, RANDOM: Creatinine, Urine: 143.87 mg/dL

## 2020-09-10 LAB — POC OCCULT BLOOD, ED: Fecal Occult Bld: NEGATIVE

## 2020-09-10 LAB — PHOSPHORUS: Phosphorus: 6.5 mg/dL — ABNORMAL HIGH (ref 2.5–4.6)

## 2020-09-10 LAB — MAGNESIUM: Magnesium: 2.7 mg/dL — ABNORMAL HIGH (ref 1.7–2.4)

## 2020-09-10 LAB — LACTIC ACID, PLASMA: Lactic Acid, Venous: 1.5 mmol/L (ref 0.5–1.9)

## 2020-09-10 MED ORDER — METOPROLOL TARTRATE 5 MG/5ML IV SOLN
2.5000 mg | Freq: Four times a day (QID) | INTRAVENOUS | Status: DC
  Administered 2020-09-10 – 2020-09-11 (×4): 2.5 mg via INTRAVENOUS
  Filled 2020-09-10 (×4): qty 5

## 2020-09-10 MED ORDER — FINASTERIDE 5 MG PO TABS
5.0000 mg | ORAL_TABLET | Freq: Every day | ORAL | Status: DC
  Filled 2020-09-10: qty 1

## 2020-09-10 MED ORDER — LORAZEPAM 2 MG/ML IJ SOLN: 0.2500 mg | Freq: Three times a day (TID) | INTRAMUSCULAR | Status: DC | PRN

## 2020-09-10 MED ORDER — ATORVASTATIN CALCIUM 10 MG PO TABS
10.0000 mg | ORAL_TABLET | Freq: Every day | ORAL | Status: DC
  Filled 2020-09-10: qty 1

## 2020-09-10 MED ORDER — SODIUM CHLORIDE 0.9 % IV SOLN: INTRAVENOUS | Status: DC

## 2020-09-10 MED ORDER — ISOSORBIDE MONONITRATE ER 60 MG PO TB24
60.0000 mg | ORAL_TABLET | Freq: Every day | ORAL | Status: DC
  Filled 2020-09-10: qty 1

## 2020-09-10 MED ORDER — HYDRALAZINE HCL 20 MG/ML IJ SOLN: 10.0000 mg | Freq: Four times a day (QID) | INTRAMUSCULAR | Status: DC | PRN

## 2020-09-10 MED ORDER — LORAZEPAM 2 MG/ML IJ SOLN
0.5000 mg | Freq: Three times a day (TID) | INTRAMUSCULAR | Status: DC | PRN
  Administered 2020-09-10: 0.5 mg via INTRAVENOUS
  Filled 2020-09-10: qty 1

## 2020-09-10 MED ORDER — LACTATED RINGERS IV SOLN: INTRAVENOUS | Status: DC

## 2020-09-10 MED ORDER — ORAL CARE MOUTH RINSE
15.0000 mL | Freq: Two times a day (BID) | OROMUCOSAL | Status: DC
  Administered 2020-09-10 – 2020-09-12 (×3): 15 mL via OROMUCOSAL

## 2020-09-10 MED ORDER — HYDRALAZINE HCL 20 MG/ML IJ SOLN
5.0000 mg | Freq: Four times a day (QID) | INTRAMUSCULAR | Status: DC | PRN
  Administered 2020-09-10: 5 mg via INTRAVENOUS
  Filled 2020-09-10: qty 1

## 2020-09-10 NOTE — Progress Notes (Signed)
PROGRESS NOTE    John Mason  RKY:[id number] DOB: 09/20/1929 DOA: 09/12/2020 PCP: Lauree Chandler, NP   Chief Complaint  Patient presents with  . Altered Mental Status  . Weakness    Brief Narrative:  HPI per Dr. Valentina Lucks is a 86 y.o. male with medical history significant for CAD s/p PCI, chronic diastolic CHF (EF 31%, G2 DD on TTE 04/24/2020), paroxysmal atrial fibrillation not on anticoagulation due to GI bleeding, severe aortic regurgitation, HTN, HLD, BPH who presents to the ED for evaluation of progressive altered mental status and generalized weakness.  Patient is unable to provide history due to somnolence therefore majority of history is obtained from daughter at bedside, EDP, and chart review.  Daughter states that over the last several months patient has had some memory deficits however has been otherwise functioning well.  She says he has chronic loose stools however about a week ago he had significantly more frequent diarrhea.  Over the last 2 days he has had a significant change in which he has been very fatigued.  He has been staying in bed mostly, only occasionally getting up into a chair.  He has not been eating and therefore has not had any further bowel movements last 2 days.  He has been much less interactive and not communicating as he normally has.  He has been generally weak with a fall at home.  He did not have any significant injury.   He has not been communicating his needs and only stated that he hurt all over.  Daughter states that he was recently given home oxygen to use at night, however patient says he has not wanted to use it.  Daughter says that he was very agitated on arrival to the ED however is now resting peacefully after receiving medications for anxiety/agitation.  ED Course:  Initial vitals showed BP 141/97, pulse 96, RR 20, temp 97.9 Fahrenheit, SPO2 98% on room air.  Labs show WBC 10.5, hemoglobin 12.9, platelets 175,000, sodium  143, potassium 5.3, bicarb 18, BUN 52, creatinine 1.75, serum glucose 165, AST 51, ALT 33, alk phos 79, total bilirubin 2.3, CK 794 high-sensitivity troponin I 57 > 49, lactic acid 3.4.  Urinalysis showed negative nitrites, negative leukocytes, >50 RBC/hpf, 0-5 WBC/hpf, rare bacteria microscopy.  VBG showed pH 7.337, PCO2 31.2, PO2 86.1.  Portable chest x-ray showed mild bibasilar atelectasis, chronic appearing interstitial lung markings, and cardiomegaly.  CT head without contrast showed atrophy and chronic microvascular disease without acute intracranial abnormality.  CT cervical spine without contrast showed diffuse degenerative disc and facet disease without acute bony abnormality.  CT chest/abdomen/pelvis without contrast showed irregular wall thickening at the junction of the sigmoid colon and rectum over 7 cm segment indeterminate for tumor versus proctitis.  Cardiomegaly with small/trace bilateral pleural effusions noted.  Bilateral pulmonary reticular and groundglass opacities also seen felt to reflect interstitial edema versus atelectasis superimposed on degree of chronic lung disease.  No other acute or inflammatory process identified in the chest/abdomen/pelvis.  Patient was given IV ceftriaxone and Flagyl.  Patient was agitated in the ED and received 1 mg IV Ativan and 5 mg IM Haldol.  He also received 1 L normal saline.  The hospitalist service was consulted to admit for further evaluation and management. Review of Systems:  Unable to obtain full review of systems due to altered mental status/somnolence.    Assessment & Plan:   Principal Problem:   Acute encephalopathy Active Problems:  Essential hypertension   CAD (coronary artery disease)   AKI (acute kidney injury) ([place])   HLD (hyperlipidemia)   BPH (benign prostatic hyperplasia)   Paroxysmal atrial fibrillation (HCC)   Chronic combined systolic and diastolic CHF (congestive heart failure) (HCC)    Hyperkalemia   Proctitis   Abnormal CT of the abdomen   Dehydration  1 abnormal CT abdomen and pelvis/concern for proctitis versus tumor Patient had presented with altered mental status, acute kidney injury and concern for proctitis.  CT abdomen and pelvis which was done showed changes concerning for proctitis versus tumor at the junction of the sigmoid colon and rectum with a segment of 7 cm.  Lactic acid noted to be elevated.  Patient placed on IV fluids for hydration.  Lactic acid levels improved down to 1.5.  Leukocytosis of 12.1.  Continue IV Rocephin and IV Flagyl.  If patient develops loose stools will check a C. difficile PCR and GI pathogen panel.  However per admitting MD patient with no bowel movement in the past 2 days.  Consult with GI for further evaluation and management.  2.  Acute metabolic encephalopathy In the setting of underlying cognitive impairment at baseline.  Patient noted to have a worsening confusion over the past 2 days likely secondary to concern for proctitis, severe dehydration, acute kidney injury.  Continue hydration with IV fluids, empiric IV antibiotics, supportive care.  Follow.  3.  Acute kidney injury Secondary to a prerenal azotemia secondary to hypovolemia/dehydration in the setting of GI losses and ARB and spironolactone.  ARB and spironolactone on hold.  Urinalysis nitrite negative, leukocytes negative, 100 protein.  Urine sodium 140.  Urine creatinine 143.87.  CT abdomen and pelvis with no noted hydronephrosis.  Renal function with no significant change from admission.  Increase IV fluids to 100 cc an hour.  Supportive care.  Follow.  4.  Hyperkalemia In the setting of acute kidney injury and ARB and spironolactone use.  ARB and spironolactone on hold.  Improved with hydration.  Follow.  5.  Chronic combined systolic and diastolic CHF/coronary artery disease status post PCI Last 2D echo with a EF of 64%, grade 2 diastolic dysfunction.  Patient volume  depleted, dehydrated.  Continue hydration with IV fluids.  Continue to hold spironolactone and ARB.  Monitor volume status closely with hydration.  Continue statin.  Not on aspirin due to recent GI bleed.  Follow.  6.  Paroxysmal atrial fibrillation Recently diagnosed.  Patient currently somnolent and likely unable to take any oral intake at this time.  Placed on IV Lopressor 2.5 mg every 6 hours for rate control.  Not on anticoagulation aspirin due to recent GI bleed.  Follow.  7.  Hypertension Placed on IV metoprolol.  Lopressor as needed.  8.  BPH Proscar one patient more alert and tolerating oral intake.  9.  Dehydration IV fluids.  10.  Generalized weakness/deconditioning/goals of care Patient noted to have significant deconditioning, failure to thrive prior to admission.  Admitting physician discussed with patient's daughter on admission in terms of patient's deterioration.  Patient currently DNR/DNI.  Patient to be monitored over the next 24 to 48 hours and if patient was to deteriorate further with no significant clinical improvement may need to shift focus to comfort measures.  Palliative care consulted.  DVT prophylaxis: Heparin Code Status: DNR Family Communication: No family at bedside. Disposition:   Status is: Inpatient    Dispo: The patient is from: Home  Anticipated d/c is to: To be determined              Anticipated d/c date is: To be determined              Patient currently somnolent, noted to be agitated, concern for intra-abdominal infection on IV antibiotics, palliative care consultation, GI consultation.  Not stable for discharge.       Consultants:   Gastroenterology: Dr. Loletha Carrow III 09/10/2020  Palliative care:   Procedures:   CT abdomen and pelvis 09/18/2020  CT C-spine 09/15/2020  CT head 09/08/2020  CT chest 09/16/2020\  Chest x-ray 08/23/2020  Antimicrobials:   IV Rocephin 09/21/2020>>>>  IV Flagyl  09/12/2020>>>>   Subjective: Patient somnolent.  In mittens.  We have recently received some sedation.  Objective: Vitals:   09/10/20 1256 09/10/20 1300 09/10/20 1350 09/10/20 1400  BP: (!) 165/92 (!) 183/64 (!) 148/115 (!) 178/68  Pulse: 62 (!) 55 85   Resp: 19 (!) 29 18 20   Temp:      TempSrc:      SpO2: 98% 99% 93%     Intake/Output Summary (Last 24 hours) at 09/10/2020 1723 Last data filed at 09/10/2020 1232 Gross per 24 hour  Intake 984.31 ml  Output 100 ml  Net 884.31 ml   There were no vitals filed for this visit.  Examination:  General exam: Somnolent.  In mittens. Respiratory system: Clear to auscultation anterior lung fields. Respiratory effort normal. Cardiovascular system: Irregularly irregular.  No JVD, no murmurs rubs or gallops.  No lower extremity edema.   Gastrointestinal system: Abdomen is nondistended, soft and some tenderness to palpation in the lower abdominal region.  Normal bowel sounds.  Central nervous system: Alert and oriented. No focal neurological deficits. Extremities: Symmetric 5 x 5 power. Skin: No rashes, lesions or ulcers Psychiatry: Judgement and insight appear normal. Mood & affect appropriate.     Data Reviewed: I have personally reviewed following labs and imaging studies  CBC: Recent Labs  Lab 09/21/2020 1939 09/10/20 0500  WBC 10.5 12.1*  HGB 12.9* 12.8*  HCT 42.8 43.5  MCV 85.3 86.8  PLT 175 998    Basic Metabolic Panel: Recent Labs  Lab 09/05/2020 1939 09/10/20 0500  NA 143 143  K 5.3* 5.0  CL 107 110  CO2 18* 19*  GLUCOSE 165* 158*  BUN 52* 55*  CREATININE 1.75* 1.71*  CALCIUM 9.4 8.9  MG  --  2.7*  PHOS  --  6.5*    GFR: CrCl cannot be calculated (Unknown ideal weight.).  Liver Function Tests: Recent Labs  Lab 08/30/2020 1939  AST 51*  ALT 33  ALKPHOS 79  BILITOT 2.3*  PROT 6.9  ALBUMIN 3.9    CBG: Recent Labs  Lab 08/26/2020 1919  GLUCAP 158*     Recent Results (from the past 240 hour(s))   Culture, blood (single) w Reflex to ID Panel     Status: None (Preliminary result)   Collection Time: 08/25/2020  7:20 PM   Specimen: BLOOD RIGHT FOREARM  Result Value Ref Range Status   Specimen Description   Final    BLOOD RIGHT FOREARM Performed at [facility], [place] [address], [place], [place] [address]    Special Requests   Final    BOTTLES DRAWN AEROBIC AND ANAEROBIC Blood Culture results may not be optimal due to an inadequate volume of blood received in culture bottles Performed at [facility] [address] [place], [place]  [address]    Culture   Final    NO GROWTH < 12 HOURS Performed at [facility] [address], [place], [place] [address]    Report Status PENDING  Incomplete  Respiratory Panel by RT PCR (Flu A&B, Covid) - Nasopharyngeal Swab     Status: None   Collection Time: 09/08/2020  8:57 PM   Specimen: Nasopharyngeal Swab  Result Value Ref Range Status   SARS Coronavirus 2 by RT PCR NEGATIVE NEGATIVE Final    Comment: (NOTE) SARS-CoV-2 target nucleic acids are NOT DETECTED.  The SARS-CoV-2 RNA is generally detectable in upper respiratoy specimens during the acute phase of infection. The lowest concentration of SARS-CoV-2 viral copies this assay can detect is 131 copies/mL. A negative result does not preclude SARS-Cov-2 infection and should not be used as the sole basis for treatment or other patient management decisions. A negative result may occur with  improper specimen collection/handling, submission of specimen other than nasopharyngeal swab, presence of viral mutation(s) within the areas targeted by this assay, and inadequate number of viral copies (<131 copies/mL). A negative result must be combined with clinical observations, patient history, and epidemiological information. The expected result is Negative.  Fact Sheet for Patients:  PinkCheek.be  Fact Sheet for Healthcare Providers:   GravelBags.it  This test is no t yet approved or cleared by the [place] FDA and  has been authorized for detection and/or diagnosis of SARS-CoV-2 by FDA under an Emergency Use Authorization (EUA). This EUA will remain  in effect (meaning this test can be used) for the duration of the COVID-19 declaration under Section 564(b)(1) of the Act, 21 U.S.C. section 360bbb-3(b)(1), unless the authorization is terminated or revoked sooner.     Influenza A by PCR NEGATIVE NEGATIVE Final   Influenza B by PCR NEGATIVE NEGATIVE Final    Comment: (NOTE) The Xpert Xpress SARS-CoV-2/FLU/RSV assay is intended as an aid in  the diagnosis of influenza from Nasopharyngeal swab specimens and  should not be used as a sole basis for treatment. Nasal washings and  aspirates are unacceptable for Xpert Xpress SARS-CoV-2/FLU/RSV  testing.  Fact Sheet for Patients: PinkCheek.be  Fact Sheet for Healthcare Providers: GravelBags.it  This test is not yet approved or cleared by the [place] FDA and  has been authorized for detection and/or diagnosis of SARS-CoV-2 by  FDA under an Emergency Use Authorization (EUA). This EUA will remain  in effect (meaning this test can be used) for the duration of the  Covid-19 declaration under Section 564(b)(1) of the Act, 21  U.S.C. section 360bbb-3(b)(1), unless the authorization is  terminated or revoked. Performed at [facility], [place] [address] [place], [place] [address]          Radiology Studies: CT ABDOMEN PELVIS WO CONTRAST  Result Date: 09/06/2020 CLINICAL DATA:  86 year old male with weakness, altered mental status. Chest and abdominal pain. EXAM: CT CHEST, ABDOMEN AND PELVIS WITHOUT CONTRAST TECHNIQUE: Multidetector CT imaging of the chest, abdomen and pelvis was performed following the standard protocol without IV contrast. COMPARISON:   Portable chest radiograph today. FINDINGS: CT CHEST FINDINGS Cardiovascular: Calcified coronary artery and aortic atherosclerosis. Cardiomegaly. No pericardial effusion. Vascular patency is not evaluated in the absence of IV contrast. Mediastinum/Nodes: No mediastinal or hilar lymphadenopathy is evident. Lungs/Pleura: Small layering left pleural effusion with simple fluid density favoring transudate. Trace layering right pleural effusion. Mild respiratory motion. Major airways are patent but with atelectatic changes. Bilateral peripheral and dependent pulmonary  reticular and ground-glass opacity. Some associated septal thickening mostly in the lung apices. No consolidation. Musculoskeletal: Osteopenia. Mild T12 superior endplate Schmorl's node. No acute osseous abnormality identified. CT ABDOMEN PELVIS FINDINGS Hepatobiliary: Negative noncontrast liver and gallbladder. Pancreas: Atrophied. Spleen: Negative. Adrenals/Urinary Tract: Normal adrenal glands. Benign small renal cysts suspected in both kidneys. No hydronephrosis or hydroureter. There is a punctate right lower pole renal calculus on coronal image 80. A small bladder dome diverticulum is suspected on series 3, image 110. Otherwise the bladder is within normal limits. Stomach/Bowel: Low-density retained stool in the rectum. Mild motion artifact in the mid abdomen. There is irregular wall thickening of large bowel at the junction of the sigmoid and rectum (coronal images 68 through 78) measuring up to 15 mm in thickness over a segment of about 7 cm. There is regional dependent presacral stranding although no directly adjacent pericolonic stranding. No regional lymphadenopathy. Redundant sigmoid colon. Decompressed descending colon. Redundant transverse colon with retained stool. Similar retained stool in the right colon. No other large bowel wall thickening identified. Diminutive or absent appendix. Motion artifact at the ileocecal valve. Terminal ileum  appears negative. No dilated small bowel. Decompressed stomach. No free air. No free fluid in the abdomen. No small bowel mesenteric stranding. Vascular/Lymphatic: Aortoiliac calcified atherosclerosis. Normal caliber aorta. Vascular patency is not evaluated in the absence of IV contrast. No lymphadenopathy. Reproductive: Negative. Other: Mild presacral stranding.  No overt pelvic free fluid. Musculoskeletal: Diffuse lumbar vacuum disc. Lumbar scoliosis. Osteopenia. No acute osseous abnormality identified. IMPRESSION: 1. Irregular wall thickening at the junction of the sigmoid colon and rectum over a segment of about 7 cm appears indeterminate for Tumor versus Proctitis. Regional presacral inflammatory stranding. No lymphadenopathy. 2. Cardiomegaly with small layering left and trace right pleural effusions. Bilateral pulmonary reticular and ground-glass opacity could reflect interstitial edema and/or atelectasis superimposed on a degree of chronic lung disease. Viral/atypical respiratory infection is felt less likely. 3. No other acute or inflammatory process identified in the non-contrast chest, abdomen, or pelvis. 4. Calcified coronary artery and Aortic Atherosclerosis (ICD10-I70.0). Right nephrolithiasis. Electronically Signed   By: Genevie Ann M.D.   On: 08/24/2020 21:49   CT HEAD WO CONTRAST  Result Date: 08/29/2020 CLINICAL DATA:  Generalized weakness, altered mental status EXAM: CT HEAD WITHOUT CONTRAST TECHNIQUE: Contiguous axial images were obtained from the base of the skull through the vertex without intravenous contrast. COMPARISON:  None. FINDINGS: Brain: There is atrophy and chronic small vessel disease changes. No acute intracranial abnormality. Specifically, no hemorrhage, hydrocephalus, mass lesion, acute infarction, or significant intracranial injury. Vascular: No hyperdense vessel or unexpected calcification. Skull: No acute calvarial abnormality. Sinuses/Orbits: Visualized paranasal sinuses and  mastoids clear. Orbital soft tissues unremarkable. Other: None IMPRESSION: Atrophy, chronic microvascular disease. No acute intracranial abnormality. Electronically Signed   By: Rolm Baptise M.D.   On: 08/30/2020 21:37   CT Chest Wo Contrast  Result Date: 09/18/2020 CLINICAL DATA:  86 year old male with weakness, altered mental status. Chest and abdominal pain. EXAM: CT CHEST, ABDOMEN AND PELVIS WITHOUT CONTRAST TECHNIQUE: Multidetector CT imaging of the chest, abdomen and pelvis was performed following the standard protocol without IV contrast. COMPARISON:  Portable chest radiograph today. FINDINGS: CT CHEST FINDINGS Cardiovascular: Calcified coronary artery and aortic atherosclerosis. Cardiomegaly. No pericardial effusion. Vascular patency is not evaluated in the absence of IV contrast. Mediastinum/Nodes: No mediastinal or hilar lymphadenopathy is evident. Lungs/Pleura: Small layering left pleural effusion with simple fluid density favoring transudate. Trace layering right pleural effusion. Mild  respiratory motion. Major airways are patent but with atelectatic changes. Bilateral peripheral and dependent pulmonary reticular and ground-glass opacity. Some associated septal thickening mostly in the lung apices. No consolidation. Musculoskeletal: Osteopenia. Mild T12 superior endplate Schmorl's node. No acute osseous abnormality identified. CT ABDOMEN PELVIS FINDINGS Hepatobiliary: Negative noncontrast liver and gallbladder. Pancreas: Atrophied. Spleen: Negative. Adrenals/Urinary Tract: Normal adrenal glands. Benign small renal cysts suspected in both kidneys. No hydronephrosis or hydroureter. There is a punctate right lower pole renal calculus on coronal image 80. A small bladder dome diverticulum is suspected on series 3, image 110. Otherwise the bladder is within normal limits. Stomach/Bowel: Low-density retained stool in the rectum. Mild motion artifact in the mid abdomen. There is irregular wall thickening  of large bowel at the junction of the sigmoid and rectum (coronal images 68 through 78) measuring up to 15 mm in thickness over a segment of about 7 cm. There is regional dependent presacral stranding although no directly adjacent pericolonic stranding. No regional lymphadenopathy. Redundant sigmoid colon. Decompressed descending colon. Redundant transverse colon with retained stool. Similar retained stool in the right colon. No other large bowel wall thickening identified. Diminutive or absent appendix. Motion artifact at the ileocecal valve. Terminal ileum appears negative. No dilated small bowel. Decompressed stomach. No free air. No free fluid in the abdomen. No small bowel mesenteric stranding. Vascular/Lymphatic: Aortoiliac calcified atherosclerosis. Normal caliber aorta. Vascular patency is not evaluated in the absence of IV contrast. No lymphadenopathy. Reproductive: Negative. Other: Mild presacral stranding.  No overt pelvic free fluid. Musculoskeletal: Diffuse lumbar vacuum disc. Lumbar scoliosis. Osteopenia. No acute osseous abnormality identified. IMPRESSION: 1. Irregular wall thickening at the junction of the sigmoid colon and rectum over a segment of about 7 cm appears indeterminate for Tumor versus Proctitis. Regional presacral inflammatory stranding. No lymphadenopathy. 2. Cardiomegaly with small layering left and trace right pleural effusions. Bilateral pulmonary reticular and ground-glass opacity could reflect interstitial edema and/or atelectasis superimposed on a degree of chronic lung disease. Viral/atypical respiratory infection is felt less likely. 3. No other acute or inflammatory process identified in the non-contrast chest, abdomen, or pelvis. 4. Calcified coronary artery and Aortic Atherosclerosis (ICD10-I70.0). Right nephrolithiasis. Electronically Signed   By: Genevie Ann M.D.   On: 09/04/2020 21:49   CT CERVICAL SPINE WO CONTRAST  Result Date: 09/07/2020 CLINICAL DATA:  Generalized  weakness, altered mental status EXAM: CT CERVICAL SPINE WITHOUT CONTRAST TECHNIQUE: Multidetector CT imaging of the cervical spine was performed without intravenous contrast. Multiplanar CT image reconstructions were also generated. COMPARISON:  None FINDINGS: Alignment: Slight degenerative retrolisthesis of C4 on C5. Skull base and vertebrae: No acute fracture. No primary bone lesion or focal pathologic process. Soft tissues and spinal canal: No prevertebral fluid or swelling. No visible canal hematoma. Disc levels: Diffuse moderate degenerative disc disease and facet disease bilaterally, left greater than right. Upper chest: Layering bilateral effusions noted. Other: None IMPRESSION: Diffuse degenerative disc and facet disease. No acute bony abnormality. Layering bilateral pleural effusions. Electronically Signed   By: Rolm Baptise M.D.   On: 09/07/2020 21:39   DG Chest Port 1 View  Result Date: 09/12/2020 CLINICAL DATA:  Weakness and altered mental status. EXAM: PORTABLE CHEST 1 VIEW COMPARISON:  None. FINDINGS: Mild, diffuse, chronic appearing increased interstitial lung markings are seen. Mild areas of atelectasis and/or infiltrate are also seen within the bilateral lung bases. There is mild to moderate severity enlargement of the cardiac silhouette. Mild calcification of the thoracic aorta is seen. The visualized skeletal structures  are unremarkable. IMPRESSION: 1. Mild bibasilar atelectasis and/or infiltrate. 2. Mild, diffuse, chronic appearing increased interstitial lung markings. 3. Mild to moderate severity cardiomegaly. Electronically Signed   By: Virgina Norfolk M.D.   On: 09/04/2020 20:13        Scheduled Meds: . atorvastatin  10 mg Oral Daily  . finasteride  5 mg Oral Daily  . heparin  5,000 Units Subcutaneous Q8H  . isosorbide mononitrate  60 mg Oral Daily  . mouth rinse  15 mL Mouth Rinse BID  . metoprolol tartrate  2.5 mg Intravenous Q6H  . sodium chloride flush  3 mL  Intravenous Q12H   Continuous Infusions: . sodium chloride 100 mL/hr at 09/10/20 1232  . cefTRIAXone (ROCEPHIN)  IV    . metronidazole 500 mg (09/10/20 1515)     LOS: 0 days    Time spent: 35 minutes    Irine Seal, MD [facility]   To contact the attending provider between 7A-7P or the covering provider during after hours 7P-7A, please log into the web site [email or web address] and access using universal [place] password for that web site. If you do not have the password, please call the hospital operator.  09/10/2020, 5:23 PM

## 2020-09-10 NOTE — Evaluation (Signed)
SLP Cancellation Note  Patient Details Name: John Mason MRN: [id number] DOB: 11-09-1929   Cancelled treatment:       Reason Eval/Treat Not Completed: Other (comment) (spoke to RN, pt is delirious at this time and not appropriate for evaluation/po)  Rolena Infante, MS [facility] SLP Acute Rehab Services Office [phone] Pager [phone]   Chales Abrahams 09/10/2020, 5:54 PM

## 2020-09-10 NOTE — ED Notes (Addendum)
Patient very confused this morning and not opening his eyes, unable to follow commands.  Not comfortable give PO meds.  Mouth care performed and Dr. Janee Morn made aware.  Patient seems very uncomfortable and borderline agitated/restless, pulling gown/ blankets off and pulling at lines, Janee Morn, MD aware.  Will keep patient NPO until speech can evaluated.

## 2020-09-10 NOTE — Consult Note (Addendum)
Referring Provider: No ref. provider found Primary Care Physician:  Lauree Chandler, NP Primary Gastroenterologist:  Dr. Carlean Purl   Reason for Consultation:  Diarrhea, possible proctitis vs mass at the sigmoid/rectal junction   HPI: John Mason is a 86 y.o. male with a past medical history of CAD, anterior MI s/p PCI to LAD in 8416, chronic diastolic CHF with LV EF 60-63%, paroxysmal atrial fibrillation not on anticoagulation ( he was briefly on Xarelto 04/2020 which was discontinued after he developed rectal bleeding and a 1gm drop in his Hg level), severe aortic regurgitation, HTN,  CKD, HLD and BPH. He presented to  [facility] ED on 08/31/2020 with altered mental status.  He is currently sedated and nonconversant.  He received Ativan and Haldol in the ED duet to agitation.  I have obtained his history from his epic records and from the hospitalist admission consult note. His family reported he had loose stools for the past 2 months which worsened over the past week with associated fatigue. No family at the bedside at this time.  Labs in the ED showed BUN 52.  Creatinine 1.75.  Elevated troponin 57 and CK 794.  Hemoglobin 12.9.  WBC 10.5.  Abdominal/pelvic CT scan without contrast showed a 7 cm tumor versus proctitis at the junction of the sigmoid colon and rectum. He was started on Ceftriaxone and Flagyl IV.  GI consult was requested for further evaluation.  Addendum: I contacted the patient's daughter Madaline Savage. She reported her father had soft pudding to loose stools several times daily for the past 2 to 3 months. No bloody diarrhea. No melena. No abdominal pain. He was previously fairly active. His appetite has been decreased with worsening fatigue over the past 2 weeks. He is typically very talkative and not anxious. She stated her father previously declined any further screening colonoscopies. I discussed the above CTAP findings with Madaline Savage and she stated her father would not wish to purse any endoscopic  evaluation.   ED Course:  Sodium 143.  Potassium 5.3.  Glucose 165.  BUN 52.  Creatinine 1.75.  Calcium 9.4.  Alk phos 79.  Albumin 3.9.  AST 51.  ALT 33.  Total bili 2.3.  CK 794.  Troponin 57.  WBC 10.5.  Hemoglobin 12.9.  Hematocrit 42.8.  MCV 85.3.  Platelet 175.  Lactic acid 3.4.  SARS coronavirus 2 negative.  Influenza AMB negative.  Urine with moderate hemoglobin.  Urine protein 100. Blood cultures pending.  Urine culture pending.   Abdominal/Pelvic CT without contrast 09/01/2020:  1. Irregular wall thickening at the junction of the sigmoid colon and rectum over a segment of about 7 cm appears indeterminate for Tumor versus Proctitis. Regional presacral inflammatory stranding. No lymphadenopathy.  2. Cardiomegaly with small layering left and trace right pleural effusions. Bilateral pulmonary reticular and ground-glass opacity could reflect interstitial edema and/or atelectasis superimposed on a degree of chronic lung disease. Viral/atypical respiratory infection is felt less likely.  3. No other acute or inflammatory process identified in the non-contrast chest, abdomen, or pelvis.  4. Calcified coronary artery and Aortic Atherosclerosis (ICD10-I70.0). Right nephrolithiasis.     EGD 01/04/2006 by Dr. Carlean Purl: Antrum ulcer, a few ulcers pre-pyloric area + H. Pylori   Colonoscopy 09/14/2005: Procedure report not available in Epic.     Past Medical History:  Diagnosis Date  . Abnormality of gait   . Actinic keratosis   . Cognitive deficits, late effect of cerebrovascular disease   . Contact dermatitis and other  eczema due to other specified agent   . Dizziness and giddiness   . Elevated prostate specific antigen (PSA)   . Essential and other specified forms of tremor   . First degree atrioventricular block   . Gastric ulcer, unspecified as acute or chronic, without mention of hemorrhage, perforation, or obstruction   . Hypertrophy of prostate without urinary  obstruction and other lower urinary tract symptoms (LUTS)   . Impotence of organic origin   . Insomnia, unspecified   . Obesity, unspecified   . Other abnormal blood chemistry   . Pain in joint, lower leg   . STEMI (ST elevation myocardial infarction) ([place]) 05/07/2014  . Unspecified essential hypertension   . Unspecified glaucoma(365.9)   . Unspecified vitamin D deficiency   . Urge incontinence   . Urinary frequency     Past Surgical History:  Procedure Laterality Date  . APPENDECTOMY  1930's?  Mar[facility] Kitchen CATARACT EXTRACTION W/ INTRAOCULAR LENS  IMPLANT, BILATERAL Bilateral   . CORONARY ANGIOPLASTY WITH STENT PLACEMENT  05/07/2014   "1"  . CYST EXCISION Left 1977   "knee"  . LEFT HEART CATHETERIZATION WITH CORONARY ANGIOGRAM N/A 05/07/2014   Procedure: LEFT HEART CATHETERIZATION WITH CORONARY ANGIOGRAM;  Surgeon: Laverda Page, MD;  Location: [facility] CATH LAB;  Service: Cardiovascular;  Laterality: N/A;  . MULTIPLE TOOTH EXTRACTIONS    . TRANSURETHRAL RESECTION OF PROSTATE  1987    Prior to Admission medications   Medication Sig Start Date End Date Taking? Authorizing Provider  atorvastatin (LIPITOR) 10 MG tablet TAKE 1 TABLET BY MOUTH EVERY DAY 08/09/20  Yes Adrian Prows, MD  finasteride (PROSCAR) 5 MG tablet TAKE 1 TABLET BY MOUTH EVERY DAY 06/05/20  Yes Lauree Chandler, NP  isosorbide mononitrate (IMDUR) 60 MG 24 hr tablet TAKE 1 TABLET BY MOUTH EVERY DAY 02/26/20  Yes Miquel Dunn, NP  latanoprost (XALATAN) 0.005 % ophthalmic solution Place 1 drop into both eyes at bedtime.  03/12/13  Yes [provider]  nitroGLYCERIN (NITROSTAT) 0.4 MG SL tablet Place 1 tablet (0.4 mg total) under the tongue every 5 (five) minutes as needed for chest pain. 07/05/20  Yes Adrian Prows, MD  spironolactone (ALDACTONE) 25 MG tablet Take 0.5 tablets (12.5 mg total) by mouth daily. 04/16/20  Yes Ngetich, Dinah C, NP  valsartan (DIOVAN) 160 MG tablet Take 1 tablet (160 mg total) by mouth  daily. Patient taking differently: Take 320 mg by mouth daily.  05/08/20  Yes Adrian Prows, MD    Current Facility-Administered Medications  Medication Dose Route Frequency Provider Last Rate Last Admin  . 0.9 %  sodium chloride infusion   Intravenous Continuous Eugenie Filler, MD 100 mL/hr at 09/10/20 1005 New Bag at 09/10/20 1005  . acetaminophen (TYLENOL) tablet 650 mg  650 mg Oral Q6H PRN Lenore Cordia, MD       Or  . acetaminophen (TYLENOL) suppository 650 mg  650 mg Rectal Q6H PRN Lenore Cordia, MD      . atorvastatin (LIPITOR) tablet 10 mg  10 mg Oral Daily Patel, Vishal R, MD      . cefTRIAXone (ROCEPHIN) 2 g in sodium chloride 0.9 % 100 mL IVPB  2 g Intravenous Q24H Zada Finders R, MD      . finasteride (PROSCAR) tablet 5 mg  5 mg Oral Daily Patel, Vishal R, MD      . heparin injection 5,000 Units  5,000 Units Subcutaneous Q8H Lenore Cordia, MD   5,000 Units  at 09/10/20 0619  . hydrALAZINE (APRESOLINE) injection 5 mg  5 mg Intravenous Q6H PRN Eugenie Filler, MD   5 mg at 09/10/20 1005  . isosorbide mononitrate (IMDUR) 24 hr tablet 60 mg  60 mg Oral Daily Zada Finders R, MD      . LORazepam (ATIVAN) injection 0.5 mg  0.5 mg Intravenous Q8H PRN Eugenie Filler, MD   0.5 mg at 09/10/20 1001  . metroNIDAZOLE (FLAGYL) IVPB 500 mg  500 mg Intravenous Q8H Zada Finders R, MD 100 mL/hr at 09/10/20 0952 Rate Verify at 09/10/20 0952  . ondansetron (ZOFRAN) tablet 4 mg  4 mg Oral Q6H PRN Lenore Cordia, MD       Or  . ondansetron (ZOFRAN) injection 4 mg  4 mg Intravenous Q6H PRN Zada Finders R, MD      . sodium chloride flush (NS) 0.9 % injection 3 mL  3 mL Intravenous Q12H Lenore Cordia, MD       Current Outpatient Medications  Medication Sig Dispense Refill  . atorvastatin (LIPITOR) 10 MG tablet TAKE 1 TABLET BY MOUTH EVERY DAY 90 tablet 2  . finasteride (PROSCAR) 5 MG tablet TAKE 1 TABLET BY MOUTH EVERY DAY 30 tablet 5  . isosorbide mononitrate (IMDUR) 60 MG 24 hr  tablet TAKE 1 TABLET BY MOUTH EVERY DAY 90 tablet 1  . latanoprost (XALATAN) 0.005 % ophthalmic solution Place 1 drop into both eyes at bedtime.     . nitroGLYCERIN (NITROSTAT) 0.4 MG SL tablet Place 1 tablet (0.4 mg total) under the tongue every 5 (five) minutes as needed for chest pain. 30 tablet 12  . spironolactone (ALDACTONE) 25 MG tablet Take 0.5 tablets (12.5 mg total) by mouth daily. 90 tablet 3  . valsartan (DIOVAN) 160 MG tablet Take 1 tablet (160 mg total) by mouth daily. (Patient taking differently: Take 320 mg by mouth daily. ) 90 tablet 1    Allergies as of 09/05/2020  . (No Known Allergies)    Family History  Problem Relation Age of Onset  . Stroke Mother   . Stroke Sister   . Stroke Brother     Social History   Socioeconomic History  . Marital status: Widowed    Spouse name: Not on file  . Number of children: 2  . Years of education: Not on file  . Highest education level: Not on file  Occupational History  . Not on file  Tobacco Use  . Smoking status: Never Smoker  . Smokeless tobacco: Current User    Types: Chew  . Tobacco comment: Smokeless Tobacco, Patient chews everyday.  Vaping Use  . Vaping Use: Never used  Substance and Sexual Activity  . Alcohol use: No  . Drug use: No  . Sexual activity: Never  Other Topics Concern  . Not on file  Social History Narrative   Son passed away.   Social Determinants of Health   Financial Resource Strain:   . Difficulty of Paying Living Expenses: Not on file  Food Insecurity:   . Worried About Charity fundraiser in the Last Year: Not on file  . Ran Out of Food in the Last Year: Not on file  Transportation Needs:   . Lack of Transportation (Medical): Not on file  . Lack of Transportation (Non-Medical): Not on file  Physical Activity:   . Days of Exercise per Week: Not on file  . Minutes of Exercise per Session: Not on file  Stress:   .  Feeling of Stress : Not on file  Social Connections:   . Frequency  of Communication with Friends and Family: Not on file  . Frequency of Social Gatherings with Friends and Family: Not on file  . Attends Religious Services: Not on file  . Active Member of Clubs or Organizations: Not on file  . Attends Archivist Meetings: Not on file  . Marital Status: Not on file  Intimate Partner Violence:   . Fear of Current or Ex-Partner: Not on file  . Emotionally Abused: Not on file  . Physically Abused: Not on file  . Sexually Abused: Not on file    Review of Systems: Unable to obtain review of systems as the patient is sedated after receiving Ativan and Haldol.  No family at the bedside.  Physical Exam: Vital signs in last 24 hours: Temp:  [97.9 F (36.6 C)-99.2 F (37.3 C)] 99.2 F (37.3 C) (10/18 2003) Pulse Rate:  [25-99] 25 (10/19 1000) Resp:  [12-34] 27 (10/19 1000) BP: (130-204)/(43-99) 204/83 (10/19 1000) SpO2:  [91 %-100 %] 95 % (10/19 1000)   General: Sedated 86 year old male in no acute distress. Head:  Normocephalic and atraumatic. Eyes:  No scleral icterus. Conjunctiva pink. Ears:  Normal auditory acuity. Nose:  No deformity, discharge or lesions. Mouth: Poor dentition.  Small subungual hematoma patches.  No ulcers or lesions.  Neck:  Supple. No lymphadenopathy or thyromegaly.  Lungs: Breath sounds diminished throughout. Heart: Regular rate and rhythm, systolic murmur. Abdomen: Soft, nondistended, no masses organomegaly.  Hypoactive bowel sounds all 4 quadrants. Rectal: Small external hemorrhoid without evidence of active bleeding.  Brown soft stool in the rectum, specimen sent for guaiac testing.  Enlarged prostate.  No mass.  Musculoskeletal:  Symmetrical without gross deformities.  Pulses:  Normal pulses noted. Extremities:  Without clubbing or edema. Neurologic:  Alert and  oriented x4. No focal deficits.  Skin:  Intact without significant lesions or rashes. Psych:  Alert and cooperative. Normal mood and  affect.  Intake/Output from previous day: No intake/output data recorded. Intake/Output this shift: Total I/O In: 704.4 [I.V.:669.5; IV Piggyback:34.9] Out: -   Lab Results: Recent Labs    09/17/2020 1939 09/10/20 0500  WBC 10.5 12.1*  HGB 12.9* 12.8*  HCT 42.8 43.5  PLT 175 167   BMET Recent Labs    09/20/2020 1939 09/10/20 0500  NA 143 143  K 5.3* 5.0  CL 107 110  CO2 18* 19*  GLUCOSE 165* 158*  BUN 52* 55*  CREATININE 1.75* 1.71*  CALCIUM 9.4 8.9   LFT Recent Labs    08/29/2020 1939  PROT 6.9  ALBUMIN 3.9  AST 51*  ALT 33  ALKPHOS 79  BILITOT 2.3*   PT/INR No results for input(s): LABPROT, INR in the last 72 hours. Hepatitis Panel No results for input(s): HEPBSAG, HCVAB, HEPAIGM, HEPBIGM in the last 72 hours.    Studies/Results: CT ABDOMEN PELVIS WO CONTRAST  Result Date: 08/23/2020 CLINICAL DATA:  86 year old male with weakness, altered mental status. Chest and abdominal pain. EXAM: CT CHEST, ABDOMEN AND PELVIS WITHOUT CONTRAST TECHNIQUE: Multidetector CT imaging of the chest, abdomen and pelvis was performed following the standard protocol without IV contrast. COMPARISON:  Portable chest radiograph today. FINDINGS: CT CHEST FINDINGS Cardiovascular: Calcified coronary artery and aortic atherosclerosis. Cardiomegaly. No pericardial effusion. Vascular patency is not evaluated in the absence of IV contrast. Mediastinum/Nodes: No mediastinal or hilar lymphadenopathy is evident. Lungs/Pleura: Small layering left pleural effusion with simple fluid density favoring  transudate. Trace layering right pleural effusion. Mild respiratory motion. Major airways are patent but with atelectatic changes. Bilateral peripheral and dependent pulmonary reticular and ground-glass opacity. Some associated septal thickening mostly in the lung apices. No consolidation. Musculoskeletal: Osteopenia. Mild T12 superior endplate Schmorl's node. No acute osseous abnormality identified. CT  ABDOMEN PELVIS FINDINGS Hepatobiliary: Negative noncontrast liver and gallbladder. Pancreas: Atrophied. Spleen: Negative. Adrenals/Urinary Tract: Normal adrenal glands. Benign small renal cysts suspected in both kidneys. No hydronephrosis or hydroureter. There is a punctate right lower pole renal calculus on coronal image 80. A small bladder dome diverticulum is suspected on series 3, image 110. Otherwise the bladder is within normal limits. Stomach/Bowel: Low-density retained stool in the rectum. Mild motion artifact in the mid abdomen. There is irregular wall thickening of large bowel at the junction of the sigmoid and rectum (coronal images 68 through 78) measuring up to 15 mm in thickness over a segment of about 7 cm. There is regional dependent presacral stranding although no directly adjacent pericolonic stranding. No regional lymphadenopathy. Redundant sigmoid colon. Decompressed descending colon. Redundant transverse colon with retained stool. Similar retained stool in the right colon. No other large bowel wall thickening identified. Diminutive or absent appendix. Motion artifact at the ileocecal valve. Terminal ileum appears negative. No dilated small bowel. Decompressed stomach. No free air. No free fluid in the abdomen. No small bowel mesenteric stranding. Vascular/Lymphatic: Aortoiliac calcified atherosclerosis. Normal caliber aorta. Vascular patency is not evaluated in the absence of IV contrast. No lymphadenopathy. Reproductive: Negative. Other: Mild presacral stranding.  No overt pelvic free fluid. Musculoskeletal: Diffuse lumbar vacuum disc. Lumbar scoliosis. Osteopenia. No acute osseous abnormality identified. IMPRESSION: 1. Irregular wall thickening at the junction of the sigmoid colon and rectum over a segment of about 7 cm appears indeterminate for Tumor versus Proctitis. Regional presacral inflammatory stranding. No lymphadenopathy. 2. Cardiomegaly with small layering left and trace right  pleural effusions. Bilateral pulmonary reticular and ground-glass opacity could reflect interstitial edema and/or atelectasis superimposed on a degree of chronic lung disease. Viral/atypical respiratory infection is felt less likely. 3. No other acute or inflammatory process identified in the non-contrast chest, abdomen, or pelvis. 4. Calcified coronary artery and Aortic Atherosclerosis (ICD10-I70.0). Right nephrolithiasis. Electronically Signed   By: Genevie Ann M.D.   On: 09/06/2020 21:49   CT HEAD WO CONTRAST  Result Date: 08/30/2020 CLINICAL DATA:  Generalized weakness, altered mental status EXAM: CT HEAD WITHOUT CONTRAST TECHNIQUE: Contiguous axial images were obtained from the base of the skull through the vertex without intravenous contrast. COMPARISON:  None. FINDINGS: Brain: There is atrophy and chronic small vessel disease changes. No acute intracranial abnormality. Specifically, no hemorrhage, hydrocephalus, mass lesion, acute infarction, or significant intracranial injury. Vascular: No hyperdense vessel or unexpected calcification. Skull: No acute calvarial abnormality. Sinuses/Orbits: Visualized paranasal sinuses and mastoids clear. Orbital soft tissues unremarkable. Other: None IMPRESSION: Atrophy, chronic microvascular disease. No acute intracranial abnormality. Electronically Signed   By: Rolm Baptise M.D.   On: 09/21/2020 21:37   CT Chest Wo Contrast  Result Date: 09/15/2020 CLINICAL DATA:  86 year old male with weakness, altered mental status. Chest and abdominal pain. EXAM: CT CHEST, ABDOMEN AND PELVIS WITHOUT CONTRAST TECHNIQUE: Multidetector CT imaging of the chest, abdomen and pelvis was performed following the standard protocol without IV contrast. COMPARISON:  Portable chest radiograph today. FINDINGS: CT CHEST FINDINGS Cardiovascular: Calcified coronary artery and aortic atherosclerosis. Cardiomegaly. No pericardial effusion. Vascular patency is not evaluated in the absence of IV  contrast. Mediastinum/Nodes: No mediastinal  or hilar lymphadenopathy is evident. Lungs/Pleura: Small layering left pleural effusion with simple fluid density favoring transudate. Trace layering right pleural effusion. Mild respiratory motion. Major airways are patent but with atelectatic changes. Bilateral peripheral and dependent pulmonary reticular and ground-glass opacity. Some associated septal thickening mostly in the lung apices. No consolidation. Musculoskeletal: Osteopenia. Mild T12 superior endplate Schmorl's node. No acute osseous abnormality identified. CT ABDOMEN PELVIS FINDINGS Hepatobiliary: Negative noncontrast liver and gallbladder. Pancreas: Atrophied. Spleen: Negative. Adrenals/Urinary Tract: Normal adrenal glands. Benign small renal cysts suspected in both kidneys. No hydronephrosis or hydroureter. There is a punctate right lower pole renal calculus on coronal image 80. A small bladder dome diverticulum is suspected on series 3, image 110. Otherwise the bladder is within normal limits. Stomach/Bowel: Low-density retained stool in the rectum. Mild motion artifact in the mid abdomen. There is irregular wall thickening of large bowel at the junction of the sigmoid and rectum (coronal images 68 through 78) measuring up to 15 mm in thickness over a segment of about 7 cm. There is regional dependent presacral stranding although no directly adjacent pericolonic stranding. No regional lymphadenopathy. Redundant sigmoid colon. Decompressed descending colon. Redundant transverse colon with retained stool. Similar retained stool in the right colon. No other large bowel wall thickening identified. Diminutive or absent appendix. Motion artifact at the ileocecal valve. Terminal ileum appears negative. No dilated small bowel. Decompressed stomach. No free air. No free fluid in the abdomen. No small bowel mesenteric stranding. Vascular/Lymphatic: Aortoiliac calcified atherosclerosis. Normal caliber aorta.  Vascular patency is not evaluated in the absence of IV contrast. No lymphadenopathy. Reproductive: Negative. Other: Mild presacral stranding.  No overt pelvic free fluid. Musculoskeletal: Diffuse lumbar vacuum disc. Lumbar scoliosis. Osteopenia. No acute osseous abnormality identified. IMPRESSION: 1. Irregular wall thickening at the junction of the sigmoid colon and rectum over a segment of about 7 cm appears indeterminate for Tumor versus Proctitis. Regional presacral inflammatory stranding. No lymphadenopathy. 2. Cardiomegaly with small layering left and trace right pleural effusions. Bilateral pulmonary reticular and ground-glass opacity could reflect interstitial edema and/or atelectasis superimposed on a degree of chronic lung disease. Viral/atypical respiratory infection is felt less likely. 3. No other acute or inflammatory process identified in the non-contrast chest, abdomen, or pelvis. 4. Calcified coronary artery and Aortic Atherosclerosis (ICD10-I70.0). Right nephrolithiasis. Electronically Signed   By: Genevie Ann M.D.   On: 09/18/2020 21:49   CT CERVICAL SPINE WO CONTRAST  Result Date: 09/04/2020 CLINICAL DATA:  Generalized weakness, altered mental status EXAM: CT CERVICAL SPINE WITHOUT CONTRAST TECHNIQUE: Multidetector CT imaging of the cervical spine was performed without intravenous contrast. Multiplanar CT image reconstructions were also generated. COMPARISON:  None FINDINGS: Alignment: Slight degenerative retrolisthesis of C4 on C5. Skull base and vertebrae: No acute fracture. No primary bone lesion or focal pathologic process. Soft tissues and spinal canal: No prevertebral fluid or swelling. No visible canal hematoma. Disc levels: Diffuse moderate degenerative disc disease and facet disease bilaterally, left greater than right. Upper chest: Layering bilateral effusions noted. Other: None IMPRESSION: Diffuse degenerative disc and facet disease. No acute bony abnormality. Layering bilateral  pleural effusions. Electronically Signed   By: Rolm Baptise M.D.   On: 09/01/2020 21:39   DG Chest Port 1 View  Result Date: 08/25/2020 CLINICAL DATA:  Weakness and altered mental status. EXAM: PORTABLE CHEST 1 VIEW COMPARISON:  None. FINDINGS: Mild, diffuse, chronic appearing increased interstitial lung markings are seen. Mild areas of atelectasis and/or infiltrate are also seen within the bilateral lung bases.  There is mild to moderate severity enlargement of the cardiac silhouette. Mild calcification of the thoracic aorta is seen. The visualized skeletal structures are unremarkable. IMPRESSION: 1. Mild bibasilar atelectasis and/or infiltrate. 2. Mild, diffuse, chronic appearing increased interstitial lung markings. 3. Mild to moderate severity cardiomegaly. Electronically Signed   By: Virgina Norfolk M.D.   On: 09/10/2020 20:13    IMPRESSION/PLAN:  23.  86 year old male admitted to the hospital with altered mental status.  CTAP identified a 7 cm mass versus proctitis at the junction of the sigmoid colon and rectum. Family reports patient had loose stools for a few months. He is afebrile. WBC 12.1.  -Endoscopic evaluation deferred for now, see addendum above. Await further recommendations per Dr. Loletha Carrow -NPO -IVF per the hospitalist  -GI pathogen panel and C. Diff PCR to be ordered if the patient demonstrates diarrhea  -Continue IV Flagyl and Ceftriaxone for now   2. Remote history of H. Pylori gastric ulcers. -Continue IV PPI for now  3. AKI on  CKD. Cr. 1.71.  4. Significant cardiac history including MI, s/p PCI 2015, CHF, severe AR. Elevated Troponin level and CPK.  5. Afib, not on anticoagulation. He was briefly on Xarelto for new onset afib 04/2020 which was discontinued due to rectal bleeding and a mild drop in his Hg. His heart rate is variable 25 - 77. He is hypertensive.   6.  Normocytic Anemia. Hg 12.8 (base line Hg 12.1 - 11.5). No obvious GI bleeding.   Patrecia Pour  Kennedy-Smith  09/10/2020, 10:52 AM  I have reviewed the entire case in detail with the above APP and discussed the plan in detail.  Therefore, I agree with the diagnoses recorded above. In addition,  I have personally interviewed and examined the patient and have personally reviewed any abdominal/pelvic CT scan images.  My additional thoughts are as follows:  Complex clinical picture, but essentially appears to be either an inflammatory rectosigmoid process or, what I feel to be more likely, a malignancy.  His reported diarrhea may be overflow from chronic obstruction.  Either way, I agree entirely with a conservative plan on this poor debilitated elderly man who appears to be nearing the end of his life.  Empiric antibiotics is reasonable but I certainly would not perform any endoscopic procedures, which also appears to be his daughter's wishes.  Thank you for asking our opinion and we will be available if needed, but I suspect this patient will transition to comfort care soon.  Nelida Meuse III [phone]

## 2020-09-10 NOTE — Progress Notes (Signed)
PHARMACY - PHYSICIAN COMMUNICATION CRITICAL VALUE ALERT - BLOOD CULTURE IDENTIFICATION (BCID)  John Mason is an 86 y.o. male who presented to [facility] on 09/16/20 with a chief complaint of proctitis    Name of physician (or Provider) ContactedEmeline Darling  Current antibiotics: CTX  Changes to prescribed antibiotics recommended:  none  Results for orders placed or performed during the hospital encounter of 2020-09-16  Blood Culture ID Panel (Reflexed) (Collected: Sep 16, 2020  7:20 PM)  Result Value Ref Range   Enterococcus faecalis NOT DETECTED NOT DETECTED   Enterococcus Faecium NOT DETECTED NOT DETECTED   Listeria monocytogenes NOT DETECTED NOT DETECTED   Staphylococcus species NOT DETECTED NOT DETECTED   Staphylococcus aureus (BCID) NOT DETECTED NOT DETECTED   Staphylococcus epidermidis NOT DETECTED NOT DETECTED   Staphylococcus lugdunensis NOT DETECTED NOT DETECTED   Streptococcus species NOT DETECTED NOT DETECTED   Streptococcus agalactiae NOT DETECTED NOT DETECTED   Streptococcus pneumoniae NOT DETECTED NOT DETECTED   Streptococcus pyogenes NOT DETECTED NOT DETECTED   A.calcoaceticus-baumannii NOT DETECTED NOT DETECTED   Bacteroides fragilis NOT DETECTED NOT DETECTED   Enterobacterales NOT DETECTED NOT DETECTED   Enterobacter cloacae complex NOT DETECTED NOT DETECTED   Escherichia coli NOT DETECTED NOT DETECTED   Klebsiella aerogenes NOT DETECTED NOT DETECTED   Klebsiella oxytoca NOT DETECTED NOT DETECTED   Klebsiella pneumoniae NOT DETECTED NOT DETECTED   Proteus species NOT DETECTED NOT DETECTED   Salmonella species NOT DETECTED NOT DETECTED   Serratia marcescens NOT DETECTED NOT DETECTED   Haemophilus influenzae NOT DETECTED NOT DETECTED   Neisseria meningitidis NOT DETECTED NOT DETECTED   Pseudomonas aeruginosa NOT DETECTED NOT DETECTED   Stenotrophomonas maltophilia NOT DETECTED NOT DETECTED   Candida albicans NOT DETECTED NOT DETECTED   Candida auris NOT  DETECTED NOT DETECTED   Candida glabrata NOT DETECTED NOT DETECTED   Candida krusei NOT DETECTED NOT DETECTED   Candida parapsilosis NOT DETECTED NOT DETECTED   Candida tropicalis NOT DETECTED NOT DETECTED   Cryptococcus neoformans/gattii NOT DETECTED NOT DETECTED    Arley Phenix RPh 09/10/2020, 11:09 PM

## 2020-09-10 NOTE — Consult Note (Signed)
Consultation Note Date: 09/10/2020   Patient Name: John Mason  DOB: 09-Apr-1929  MRN: [id number]  Age / Sex: 86 y.o., male  PCP: Sharon Seller, NP Referring Physician: Rodolph Bong, MD  Reason for Consultation: Establishing goals of care  HPI/Patient Profile: 86 y.o. male  with past medical history of CAD s/p PCI, chronic diastolic CHF with EF 40%, paroxysmal A. fib not on anticoagulation due to GI bleed, severe aortic regurg, hypertension, hyperlipidemia, BPH admitted on 09/12/2020 with altered mental status and weakness.  Work-up revealed likely proctitis and he was started on empiric antibiotics as he also has reported history of diarrhea.  GI has been consulted as well.  He also has hyperkalemia as well as AKI.  In the ED, he remains somnolent other than periods of agitation.  Palliative consulted for goals of care.  Clinical Assessment and Goals of Care: Palliative care consult received.  Chart reviewed including personal review of pertinent labs and imaging.  I saw and examined John Mason today in the ED.  He was lying in bed and was restless but not in respiratory distress.  He was unable to participate any goals conversation and did not meaningfully interact with me throughout the encounter.  I called and was able to reach patient's daughter, Mayra Neer.  I introduced palliative care as specialized medical care for people living with serious illness. It focuses on providing relief from the symptoms and stress of a serious illness. The goal is to improve quality of life for both the patient and the family.  [name] reports that her father has always been very independent and "hard headed" man.  He worked for [employer] in the past.  He enjoys watching television and going on drives in the country.  In the past he has attended a [employer].  He has been living with his daughter since 34.   Either she or her husband are able to be with him around-the-clock.  He was widowed in 2007 and lives alone until he had a heart attack in 2015.  He has used a cane to assist in ambulation.  He was driving up until March of last year.  He was still able to enjoy things important to him and actually recently went on a trip with his family to the mountains in August as well.  She reports he had an episode of confusion there and since this summer he has had increasing periods of confusion and being more withdrawn.  For example, he could previously use his cell phone independently to call family and friends, but he is no longer able to operate on his own.  We discussed that Mr. Swayze has had continued decline in his nutrition, cognition, and functional status over the past several months and how this relates to his chronic medical problems.  We also discussed acute decline he has had over the past couple of weeks with increased diarrhea and lethargy and decreased ability to care for himself.  In the past, he has been using  depends in order to stay clean, but his use of these is increased significantly over the past couple weeks as well.  We discussed clinical course as well as wishes moving forward in regard to care plan this hospitalization.  We had preliminary discussion in the difference between a aggressive medical intervention path and a palliative, comfort focused care path.  Values and goals of care important to patient and family were attempted to be elicited.  We discussed plan to continue current interventions to see how he continues to respond to gentle medical interventions over the next 24 hours.  Questions and concerns addressed.   PMT will continue to support holistically.  SUMMARY OF RECOMMENDATIONS   -DNR/DNI -Primary decision-maker is his daughter, Mayra Neer -Continuation of current interventions with close monitoring of his clinical course of the next 24 to 48 hours.  His daughter has  good understanding of his situation and reports that she understands he may be approaching end-of-life regardless of interventions moving forward.  We discussed plan to reassess tomorrow (would which would give him at least 24 hours on antibiotics) prior to making any other changes to his current care plan.  Psycho-social/Spiritual:   Desire for further Chaplaincy support:no  Additional Recommendations: Caregiving  Support/Resources and Education on Hospice  Prognosis:   Guarded  Discharge Planning: To Be Determined      Primary Diagnoses: Present on Admission: . Acute encephalopathy . CAD (coronary artery disease) . Essential hypertension . HLD (hyperlipidemia) . BPH (benign prostatic hyperplasia) . Paroxysmal atrial fibrillation (HCC) . Chronic combined systolic and diastolic CHF (congestive heart failure) (HCC) . AKI (acute kidney injury) (HCC) . Hyperkalemia . Proctitis   I have reviewed the medical record, interviewed the patient and family, and examined the patient. The following aspects are pertinent.  Past Medical History:  Diagnosis Date  . Abnormality of gait   . Actinic keratosis   . Cognitive deficits, late effect of cerebrovascular disease   . Contact dermatitis and other eczema due to other specified agent   . Dizziness and giddiness   . Elevated prostate specific antigen (PSA)   . Essential and other specified forms of tremor   . First degree atrioventricular block   . Gastric ulcer, unspecified as acute or chronic, without mention of hemorrhage, perforation, or obstruction   . Hypertrophy of prostate without urinary obstruction and other lower urinary tract symptoms (LUTS)   . Impotence of organic origin   . Insomnia, unspecified   . Obesity, unspecified   . Other abnormal blood chemistry   . Pain in joint, lower leg   . STEMI (ST elevation myocardial infarction) (HCC) 05/07/2014  . Unspecified essential hypertension   . Unspecified glaucoma(365.9)     . Unspecified vitamin D deficiency   . Urge incontinence   . Urinary frequency    Social History   Socioeconomic History  . Marital status: Widowed    Spouse name: Not on file  . Number of children: 2  . Years of education: Not on file  . Highest education level: Not on file  Occupational History  . Not on file  Tobacco Use  . Smoking status: Never Smoker  . Smokeless tobacco: Current User    Types: Chew  . Tobacco comment: Smokeless Tobacco, Patient chews everyday.  Vaping Use  . Vaping Use: Never used  Substance and Sexual Activity  . Alcohol use: No  . Drug use: No  . Sexual activity: Never  Other Topics Concern  . Not on file  Social History Narrative   Son passed away.   Social Determinants of Health   Financial Resource Strain:   . Difficulty of Paying Living Expenses: Not on file  Food Insecurity:   . Worried About Programme researcher, broadcasting/film/video in the Last Year: Not on file  . Ran Out of Food in the Last Year: Not on file  Transportation Needs:   . Lack of Transportation (Medical): Not on file  . Lack of Transportation (Non-Medical): Not on file  Physical Activity:   . Days of Exercise per Week: Not on file  . Minutes of Exercise per Session: Not on file  Stress:   . Feeling of Stress : Not on file  Social Connections:   . Frequency of Communication with Friends and Family: Not on file  . Frequency of Social Gatherings with Friends and Family: Not on file  . Attends Religious Services: Not on file  . Active Member of Clubs or Organizations: Not on file  . Attends Banker Meetings: Not on file  . Marital Status: Not on file   Family History  Problem Relation Age of Onset  . Stroke Mother   . Stroke Sister   . Stroke Brother    Scheduled Meds: . atorvastatin  10 mg Oral Daily  . finasteride  5 mg Oral Daily  . heparin  5,000 Units Subcutaneous Q8H  . isosorbide mononitrate  60 mg Oral Daily  . metoprolol tartrate  2.5 mg Intravenous Q6H  .  sodium chloride flush  3 mL Intravenous Q12H   Continuous Infusions: . sodium chloride 100 mL/hr at 09/10/20 1232  . cefTRIAXone (ROCEPHIN)  IV    . metronidazole 500 mg (09/10/20 1515)   PRN Meds:.acetaminophen **OR** acetaminophen, hydrALAZINE, LORazepam, ondansetron **OR** ondansetron (ZOFRAN) IV Medications Prior to Admission:  Prior to Admission medications   Medication Sig Start Date End Date Taking? Authorizing Provider  atorvastatin (LIPITOR) 10 MG tablet TAKE 1 TABLET BY MOUTH EVERY DAY 08/09/20  Yes Yates Decamp, MD  finasteride (PROSCAR) 5 MG tablet TAKE 1 TABLET BY MOUTH EVERY DAY 06/05/20  Yes Sharon Seller, NP  isosorbide mononitrate (IMDUR) 60 MG 24 hr tablet TAKE 1 TABLET BY MOUTH EVERY DAY 02/26/20  Yes Toniann Fail, NP  latanoprost (XALATAN) 0.005 % ophthalmic solution Place 1 drop into both eyes at bedtime.  03/12/13  Yes [provider]  nitroGLYCERIN (NITROSTAT) 0.4 MG SL tablet Place 1 tablet (0.4 mg total) under the tongue every 5 (five) minutes as needed for chest pain. 07/05/20  Yes Yates Decamp, MD  spironolactone (ALDACTONE) 25 MG tablet Take 0.5 tablets (12.5 mg total) by mouth daily. 04/16/20  Yes Ngetich, Dinah C, NP  valsartan (DIOVAN) 160 MG tablet Take 1 tablet (160 mg total) by mouth daily. Patient taking differently: Take 320 mg by mouth daily.  05/08/20  Yes Yates Decamp, MD   No Known Allergies Review of Systems  Unable to obtain  Physical Exam  General: Frail and chronically ill-appearing.  Does not interact with verbal or tactile stimulation.   HEENT: No bruits, no goiter, no JVD Heart: Irregularly irregular. No murmur appreciated. Lungs: Good air movement, clear Abdomen: Soft, nontender, nondistended, positive bowel sounds.  Ext: + Pedal edema Skin: Warm and dry Neuro: Does not follow commands   Vital Signs: BP (!) 178/68 (BP Location: Left Arm)   Pulse 85   Temp 99.2 F (37.3 C) (Rectal)   Resp 20   SpO2 93%  Pain Scale:  0-10   Pain Score: 0-No pain   SpO2: SpO2: 93 % O2 Device:SpO2: 93 % O2 Flow Rate: .O2 Flow Rate (L/min): 4 L/min  IO: Intake/output summary:   Intake/Output Summary (Last 24 hours) at 09/10/2020 1547 Last data filed at 09/10/2020 1232 Gross per 24 hour  Intake 984.31 ml  Output 100 ml  Net 884.31 ml    LBM: Last BM Date: 09/10/20 (smear) Baseline Weight:   Most recent weight:       Palliative Assessment/Data:   Flowsheet Rows     Most Recent Value  Intake Tab  Referral Department Hospitalist  Unit at Time of Referral ER  Palliative Care Primary Diagnosis Sepsis/Infectious Disease  Date Notified 09/01/2020  Palliative Care Type New Palliative care  Reason for referral Clarify Goals of Care  Date of Admission 09/04/2020  Date first seen by Palliative Care 09/10/20  # of days Palliative referral response time 1 Day(s)  # of days IP prior to Palliative referral 0  Clinical Assessment  Palliative Performance Scale Score 10%  Psychosocial & Spiritual Assessment  Palliative Care Outcomes  Patient/Family meeting held? Yes  Who was at the meeting? Daughter via phone      Time In: 1220 Time Out: 1340 Time Total: 80 Greater than 50%  of this time was spent counseling and coordinating care related to the above assessment and plan.  Signed by: Romie MinusGene Congetta Odriscoll, MD   Please contact Palliative Medicine Team phone at [phone] for questions and concerns.  For individual provider: See Loretha StaplerAmion

## 2020-09-11 DIAGNOSIS — K6289 Other specified diseases of anus and rectum: Secondary | ICD-10-CM | POA: Diagnosis not present

## 2020-09-11 DIAGNOSIS — Z7189 Other specified counseling: Secondary | ICD-10-CM | POA: Diagnosis not present

## 2020-09-11 DIAGNOSIS — R7881 Bacteremia: Secondary | ICD-10-CM

## 2020-09-11 DIAGNOSIS — G9340 Encephalopathy, unspecified: Secondary | ICD-10-CM | POA: Diagnosis not present

## 2020-09-11 DIAGNOSIS — E870 Hyperosmolality and hypernatremia: Secondary | ICD-10-CM

## 2020-09-11 DIAGNOSIS — R935 Abnormal findings on diagnostic imaging of other abdominal regions, including retroperitoneum: Secondary | ICD-10-CM | POA: Diagnosis not present

## 2020-09-11 DIAGNOSIS — E878 Other disorders of electrolyte and fluid balance, not elsewhere classified: Secondary | ICD-10-CM

## 2020-09-11 DIAGNOSIS — E785 Hyperlipidemia, unspecified: Secondary | ICD-10-CM

## 2020-09-11 LAB — RENAL FUNCTION PANEL
Albumin: 3.5 g/dL (ref 3.5–5.0)
Anion gap: 18 — ABNORMAL HIGH (ref 5–15)
BUN: 62 mg/dL — ABNORMAL HIGH (ref 8–23)
CO2: 17 mmol/L — ABNORMAL LOW (ref 22–32)
Calcium: 9.2 mg/dL (ref 8.9–10.3)
Chloride: 113 mmol/L — ABNORMAL HIGH (ref 98–111)
Creatinine, Ser: 1.62 mg/dL — ABNORMAL HIGH (ref 0.61–1.24)
GFR, Estimated: 37 mL/min — ABNORMAL LOW (ref >60)
Glucose, Bld: 156 mg/dL — ABNORMAL HIGH (ref 70–99)
Phosphorus: 5.3 mg/dL — ABNORMAL HIGH (ref 2.5–4.6)
Potassium: 5.4 mmol/L — ABNORMAL HIGH (ref 3.5–5.1)
Sodium: 148 mmol/L — ABNORMAL HIGH (ref 135–145)

## 2020-09-11 LAB — CBC WITH DIFFERENTIAL/PLATELET
Abs Immature Granulocytes: 0.06 10*3/uL (ref 0.00–0.07)
Basophils Absolute: 0 10*3/uL (ref 0.0–0.1)
Basophils Relative: 0 %
Eosinophils Absolute: 0 10*3/uL (ref 0.0–0.5)
Eosinophils Relative: 0 %
HCT: 41 % (ref 39.0–52.0)
Hemoglobin: 12.4 g/dL — ABNORMAL LOW (ref 13.0–17.0)
Immature Granulocytes: 1 %
Lymphocytes Relative: 7 %
Lymphs Abs: 0.9 10*3/uL (ref 0.7–4.0)
MCH: 26.1 pg (ref 26.0–34.0)
MCHC: 30.2 g/dL (ref 30.0–36.0)
MCV: 86.1 fL (ref 80.0–100.0)
Monocytes Absolute: 0.8 10*3/uL (ref 0.1–1.0)
Monocytes Relative: 7 %
Neutro Abs: 11.1 10*3/uL — ABNORMAL HIGH (ref 1.7–7.7)
Neutrophils Relative %: 85 %
Platelets: 176 10*3/uL (ref 150–400)
RBC: 4.76 MIL/uL (ref 4.22–5.81)
RDW: 17 % — ABNORMAL HIGH (ref 11.5–15.5)
WBC: 12.9 10*3/uL — ABNORMAL HIGH (ref 4.0–10.5)
nRBC: 0.3 % — ABNORMAL HIGH (ref 0.0–0.2)

## 2020-09-11 LAB — MAGNESIUM: Magnesium: 2.7 mg/dL — ABNORMAL HIGH (ref 1.7–2.4)

## 2020-09-11 LAB — URINE CULTURE

## 2020-09-11 MED ORDER — HALOPERIDOL LACTATE 2 MG/ML PO CONC
0.5000 mg | ORAL | Status: DC | PRN
  Filled 2020-09-11: qty 0.3

## 2020-09-11 MED ORDER — DEXTROSE 5 % IV SOLN: INTRAVENOUS | Status: DC

## 2020-09-11 MED ORDER — SODIUM CHLORIDE 0.9 % IV SOLN
2.0000 g | Freq: Two times a day (BID) | INTRAVENOUS | Status: DC
  Filled 2020-09-11: qty 2

## 2020-09-11 MED ORDER — LORAZEPAM 2 MG/ML IJ SOLN: 1.0000 mg | INTRAMUSCULAR | Status: DC | PRN

## 2020-09-11 MED ORDER — HYDROMORPHONE HCL 1 MG/ML IJ SOLN
0.5000 mg | INTRAMUSCULAR | Status: DC | PRN
  Administered 2020-09-11 – 2020-09-12 (×8): 0.5 mg via INTRAVENOUS
  Filled 2020-09-11 (×8): qty 0.5

## 2020-09-11 MED ORDER — GLYCOPYRROLATE 1 MG PO TABS
1.0000 mg | ORAL_TABLET | ORAL | Status: DC | PRN
  Filled 2020-09-11: qty 1

## 2020-09-11 MED ORDER — GLYCOPYRROLATE 0.2 MG/ML IJ SOLN
0.2000 mg | INTRAMUSCULAR | Status: DC | PRN
  Filled 2020-09-11: qty 1

## 2020-09-11 MED ORDER — SODIUM BICARBONATE 650 MG PO TABS: 650.0000 mg | ORAL_TABLET | Freq: Two times a day (BID) | ORAL | Status: DC

## 2020-09-11 MED ORDER — BIOTENE DRY MOUTH MT LIQD: 15.0000 mL | OROMUCOSAL | Status: DC | PRN

## 2020-09-11 MED ORDER — GLYCOPYRROLATE 0.2 MG/ML IJ SOLN
0.2000 mg | INTRAMUSCULAR | Status: DC | PRN
  Administered 2020-09-11: 0.2 mg via INTRAVENOUS

## 2020-09-11 MED ORDER — HALOPERIDOL 0.5 MG PO TABS
0.5000 mg | ORAL_TABLET | ORAL | Status: DC | PRN
  Filled 2020-09-11: qty 1

## 2020-09-11 MED ORDER — SODIUM ZIRCONIUM CYCLOSILICATE 10 G PO PACK: 10.0000 g | PACK | Freq: Once | ORAL | Status: DC

## 2020-09-11 MED ORDER — HALOPERIDOL LACTATE 5 MG/ML IJ SOLN
0.5000 mg | INTRAMUSCULAR | Status: DC | PRN
  Administered 2020-09-11: 0.5 mg via INTRAVENOUS
  Filled 2020-09-11: qty 1

## 2020-09-11 MED ORDER — LORAZEPAM 1 MG PO TABS: 1.0000 mg | ORAL_TABLET | ORAL | Status: DC | PRN

## 2020-09-11 MED ORDER — POLYVINYL ALCOHOL 1.4 % OP SOLN
1.0000 [drp] | Freq: Four times a day (QID) | OPHTHALMIC | Status: DC | PRN
  Filled 2020-09-11: qty 15

## 2020-09-11 MED ORDER — LORAZEPAM 2 MG/ML PO CONC
1.0000 mg | ORAL | Status: DC | PRN
  Filled 2020-09-11: qty 0.5

## 2020-09-11 NOTE — Progress Notes (Signed)
Patient resting quietly in bed at this time. Daughter at bedside refuse vs. Will cont to monitor.

## 2020-09-11 NOTE — Evaluation (Signed)
SLP Cancellation Note  Patient Details Name: John Mason MRN: [id number] DOB: [birth year]   Cancelled treatment:       Reason Eval/Treat Not Completed: Other (comment) (RN, Annabelle Harman, reported pt lethargic at this time and per notes - comfort care may be initiated; requested her to contact me if indicated- pt alert for eval)  Rolena Infante, MS [facility] SLP Acute Rehab Services Office [phone] Pager [phone]   Chales Abrahams 09/11/2020, 8:14 AM

## 2020-09-11 NOTE — Progress Notes (Signed)
Patient no longer a risk to fall as he is now comfort care only and responds only to pain.

## 2020-09-11 NOTE — Progress Notes (Signed)
PROGRESS NOTE    John Mason  ATF:[id number] DOB: 1928/12/07 DOA: 09/08/2020 PCP: Lauree Chandler, NP   Brief Narrative:  HPI per Dr. Zada Finders on 09/03/2020 John Mason is a 86 y.o. male with medical history significant for CAD s/p PCI, chronic diastolic CHF (EF 27%, G2 DD on TTE 04/24/2020), paroxysmal atrial fibrillation not on anticoagulation due to GI bleeding, severe aortic regurgitation, HTN, HLD, BPH who presents to the ED for evaluation of progressive altered mental status and generalized weakness.  Patient is unable to provide history due to somnolence therefore majority of history is obtained from daughter at bedside, EDP, and chart review.  Daughter states that over the last several months patient has had some memory deficits however has been otherwise functioning well.  She says he has chronic loose stools however about a week ago he had significantly more frequent diarrhea.  Over the last 2 days he has had a significant change in which he has been very fatigued.  He has been staying in bed mostly, only occasionally getting up into a chair.  He has not been eating and therefore has not had any further bowel movements last 2 days.  He has been much less interactive and not communicating as he normally has.  He has been generally weak with a fall at home.  He did not have any significant injury.   He has not been communicating his needs and only stated that he hurt all over.  Daughter states that he was recently given home oxygen to use at night, however patient says he has not wanted to use it.  Daughter says that he was very agitated on arrival to the ED however is now resting peacefully after receiving medications for anxiety/agitation.  ED Course:  Initial vitals showed BP 141/97, pulse 96, RR 20, temp 97.9 Fahrenheit, SPO2 98% on room air.  Labs show WBC 10.5, hemoglobin 12.9, platelets 175,000, sodium 143, potassium 5.3, bicarb 18, BUN 52, creatinine 1.75, serum  glucose 165, AST 51, ALT 33, alk phos 79, total bilirubin 2.3, CK 794 high-sensitivity troponin I 57 > 49, lactic acid 3.4.  Urinalysis showed negative nitrites, negative leukocytes, >50 RBC/hpf, 0-5 WBC/hpf, rare bacteria microscopy.  VBG showed pH 7.337, PCO2 31.2, PO2 86.1.  Portable chest x-ray showed mild bibasilar atelectasis, chronic appearing interstitial lung markings, and cardiomegaly.  CT head without contrast showed atrophy and chronic microvascular disease without acute intracranial abnormality.  CT cervical spine without contrast showed diffuse degenerative disc and facet disease without acute bony abnormality.  CT chest/abdomen/pelvis without contrast showed irregular wall thickening at the junction of the sigmoid colon and rectum over 7 cm segment indeterminate for tumor versus proctitis.  Cardiomegaly with small/trace bilateral pleural effusions noted.  Bilateral pulmonary reticular and groundglass opacities also seen felt to reflect interstitial edema versus atelectasis superimposed on degree of chronic lung disease.  No other acute or inflammatory process identified in the chest/abdomen/pelvis.  Patient was given IV ceftriaxone and Flagyl.  Patient was agitated in the ED and received 1 mg IV Ativan and 5 mg IM Haldol.  He also received 1 L normal saline.  The hospitalist service was consulted to admit for further evaluation and management.  **Interim History Further work-up revealed that patient had a Pseudomonas Luteola bacteremia.  His labs are worsened somewhat with minimal improvement in his renal function.  After further goals of care discussion the patient and daughter elected to transition the patient to full comfort measures and if  patient survives the night and is stable for the morning can be discharged to residential hospice however we expect in-hospital death given his poor prognosis and overall deterioration.  Patient appears uncomfortable on examination and  comfort measures have been enacted and he has been placed on Dilaudid, Ativan, Haldol, as well as glycopyrrolate for comfort measures and all other measures have been discontinued.  Assessment & Plan:   Principal Problem:   Acute encephalopathy Active Problems:   Essential hypertension   CAD (coronary artery disease)   AKI (acute kidney injury) ([place])   HLD (hyperlipidemia)   BPH (benign prostatic hyperplasia)   Paroxysmal atrial fibrillation (HCC)   Chronic combined systolic and diastolic CHF (congestive heart failure) (HCC)   Hyperkalemia   Proctitis   Abnormal CT of the abdomen   Dehydration  Abnormal CT abdomen and pelvis/concern for proctitis versus tumor -Patient had presented with altered mental status, acute kidney injury and concern for proctitis.   -CT abdomen and pelvis which was done showed changes concerning for proctitis versus tumor at the junction of the sigmoid colon and rectum with a segment of 7 cm.   -Lactic acid noted to be elevated.  Patient placed on IV fluids for hydration and Lactic acid levels improved down to 1.5.  Leukocytosis has gone from 10.5 -> 12.1 and worsened to 12.9.  -Continued IV Rocephin and IV Flagyl but escalated to IV Cefepime given Pseudomonas Luteola Bacteremia.   -If patient developed loose stools would have checked a C. difficile PCR and GI pathogen panel. Per admitting MD patient with no bowel movement in the past 2 days.   -Consult with GI for further evaluation and management felt that the patient had a complex clinical picture but essentially it appeared to be either inflammatory rectosigmoid process or more likely a malignancy and they recommended entirely with a conservative plan on this poor debilitated patient and recommended palliative measures. -Palliative consulted for further evaluation and goals of care discussion and after further discussion with the patient's daughter she is elected to transition the patient to comfort measures  given that he he appeared to be actively dying.  All medications not in the patient's comfort have been discontinued and he has been given Dilaudid as needed for pain or shortness breath, Ativan for anxiety, Haldol for agitation nausea as well as Robinul for excessive secretions.  If the patient is to survive the night and appears stable enough in the morning could consider transport to hospice but very likely will anticipate an in-hospital death given his poor prognosis and high risk for decompensation as he is nearing end-of-life  Pseudomonas Luteola Bacteremia -Noted on blood cultures from 09/05/2020 -As above; antibiotics were escalated IV cefepime however patient has been transitioned to comfort measures only now and all medications not in the patient's comfort has been discontinued  Acute Metabolic Encephalopathy -In the setting of underlying cognitive impairment at baseline.   -Patient noted to have a worsening confusion over the past 2 days likely secondary to concern for proctitis, severe dehydration, acute kidney injury.   -Continued hydration with IV fluids but had stopped IV sodium chloride and started D5W given his hyperkalemia and hyperchloremia -Empiric IV antibiotics were changed but now has been discontinued -Continue with supportive care given the patient has a very high risk for decompensation and likely has a in-hospital death  Acute Kidney Injury -Secondary to a prerenal azotemia secondary to hypovolemia/dehydration in the setting of GI losses and ARB and spironolactone.   -ARB and  spironolactone on hold and will continue to Hold.   -Urinalysis nitrite negative, leukocytes negative, 100 protein.  Urine sodium 140.  Urine creatinine 143.87.   -CT abdomen and pelvis with no noted hydronephrosis.   -Renal function with minimal improvement from admission as BUN/Cr went from 52/1.75 -> 55/1.71 -> 62/1.62 -Increased IV fluids to 100 cc an hour but changed to D5W at 75 mL/hr given  Hypernatremia, Hyperchloremia, and Hyperkalemia -Continued Supportive care until transitioned to FULL Comfort Measures  Hyperkalemia -In the setting of acute kidney injury and ARB and spironolactone use.   -ARB and spironolactone on hold.   -Improved with hydration but worsened back to 5.4 -Changed IVF to D5W and gave a dose of Lokelma but will not check anymore as patient is being transitioned to FULL Comfort Measures and will stop IVF and not recheck labs  Hyperphosphatemia -Patient's phosphorus level from 6.5 and slightly improved to 5.3  -We will not recheck given patient is being transitioned to comfort measures  Hypermagnesemia -Patient magnesium levels 2.7 -We will not recheck as patient is being transitioned to comfort measures  Hypernatremia/Hyperchloremia -Patient sodium went from 143 is now 148 and chloride level went from 107 and is now 113 -Setting of normal saline resuscitation -Changed IV fluids from normal saline to D5W and change the rate to 75 MLS per hour however because of the shift of focus to comfort measures IV fluids will now be discontinued and will not check CMP again in the morning if the patient survives  Chronic Combined Systolic and Diastolic CHF Coronary artery disease status post PCI -Last 2D echo with a EF of 62%, grade 2 diastolic dysfunction.   -Patient volume depleted, dehydrated.   -Continued hydration with IV fluids but changed to above.  -Continue to hold spironolactone and ARB.  Monitor volume status closely with hydration.   -Continue statin.  Not on aspirin due to recent GI bleed.  -As Above patient is being transitioned to FULL Comfort Measures  Paroxysmal atrial fibrillation -Recently diagnosed within the last 6 months.   -Patient currently somnolent and likely unable to take any oral intake at this time.   -Placed on IV Lopressor 2.5 mg every 6 hours for rate control.   -Not on anticoagulation aspirin due to recent GI bleed.  -Patient  is being transitioned to Comfort Measures   Hypertension -Placed on IV metoprolol but this will be discontinued as he is being transitioned to FULL Comfort Measures    BPH -Proscar one patient more alert and tolerating oral intake.  Dehydration -IV fluids now discontinued   Generalized Weakness/Deconditioning Goals of Care -Patient noted to have significant deconditioning, failure to thrive prior to admission.   -Admitting physician discussed with patient's daughter on admission in terms of patient's deterioration.   -Patient currently DNR/DNI.   -Patient was monitored over the next 24 to 48 hours and since he had no significant clinical improvement Daughter decided to shift focus to comfort measures.   -Palliative care consulted and appreciate GOC discussion   DVT prophylaxis: None; Patient is now Comfort Care Code Status: DO NOT RESUSCITATE Family Communication: Discussed with Daughter at bedside  Disposition Plan: Residential hospice this patient is stable enough to be transported tomorrow but likely anticipate in-hospital death given his poor prognosis and current deterioration  Status is: Inpatient  Remains inpatient appropriate because:Ongoing diagnostic testing needed not appropriate for outpatient work up, Unsafe d/c plan, IV treatments appropriate due to intensity of illness or inability to take PO and  Inpatient level of care appropriate due to severity of illness   Dispo: The patient is from: Home              Anticipated d/c is to: [facility]              Anticipated d/c date is: 1-2 days              Patient currently is not medically stable to d/c.  Consultants:   Gastroenterology  Palliative Care Medicine    Procedures: None  Antimicrobials:  Anti-infectives (From admission, onward)   Start     Dose/Rate Route Frequency Ordered Stop   09/11/20 1115  ceFEPIme (MAXIPIME) 2 g in sodium chloride 0.9 % 100 mL IVPB  Status:   Discontinued        2 g 200 mL/hr over 30 Minutes Intravenous Every 12 hours 09/11/20 1017 09/11/20 1118   09/10/20 2200  cefTRIAXone (ROCEPHIN) 2 g in sodium chloride 0.9 % 100 mL IVPB  Status:  Discontinued        2 g 200 mL/hr over 30 Minutes Intravenous Every 24 hours 08/28/2020 2349 09/11/20 1016   09/10/20 0800  metroNIDAZOLE (FLAGYL) IVPB 500 mg  Status:  Discontinued        500 mg 100 mL/hr over 60 Minutes Intravenous Every 8 hours 09/08/2020 2349 09/11/20 1118   09/17/2020 2215  cefTRIAXone (ROCEPHIN) 2 g in sodium chloride 0.9 % 100 mL IVPB        2 g 200 mL/hr over 30 Minutes Intravenous  Once 09/05/2020 2207 08/27/2020 2306   09/10/2020 2215  metroNIDAZOLE (FLAGYL) IVPB 500 mg        500 mg 100 mL/hr over 60 Minutes Intravenous  Once 09/01/2020 2207 09/10/20 0028        Subjective: Patient is not awake and alert enough to provide a subjective history and does appear uncomfortable.  His eyes have a glassy appearance and he does not follow commands and remains fidgety.  Does have some dyspnea and shallow breathing.  Daughter is at bedside and she understands that he may be actively dying.  After further goals of care discussion patient was transitioned to comfort care  Objective: Vitals:   09/11/20 0231 09/11/20 0500 09/11/20 0519 09/11/20 0543  BP: (!) 172/105  (!) 154/78 (!) 181/67  Pulse: (!) 54  95 81  Resp: 18  (!) 22 20  Temp: (!) 97.4 F (36.3 C)  98.1 F (36.7 C) 98.4 F (36.9 C)  TempSrc:   Axillary Oral  SpO2: 94%  94% 97%  Weight:  70.6 kg    Height:  _0  (1.753 m)      Intake/Output Summary (Last 24 hours) at 09/11/2020 1157 Last data filed at 09/11/2020 0263 Gross per 24 hour  Intake 672.23 ml  Output 700 ml  Net -27.77 ml   Filed Weights   09/11/20 0500  Weight: 70.6 kg   Examination: Physical Exam:  Constitutional: Elderly Caucasian male currently appears slightly uncomfortable and is not awake or oriented to provide subjective history Eyes: Lids and  conjunctivae normal, sclerae anicteric  ENMT: External Ears, Nose appear normal. Grossly normal hearing.  Neck: Appears normal, supple, no cervical masses, normal ROM, no appreciable thyromegaly; no JVD Respiratory: Diminished to auscultation bilaterally with coarse breath sounds and slightly short shallow breathing., no wheezing, rales, rhonchi or crackles. Normal respiratory effort and patient is not tachypenic. No accessory muscle use.  Cardiovascular: RRR, no murmurs /  rubs / gallops. S1 and S2 auscultated.  Abdomen: Soft, non-tender, mildly distended. Bowel sounds positive.  GU: Deferred. Musculoskeletal: No clubbing / cyanosis of digits/nails. No joint deformity upper and lower extremities. Skin: No rashes, lesions, ulcers on limited skin evaluation. No induration; Warm and dry.  Neurologic: He is awake but does not follow commands and appears very fidgety Psychiatric: Impaired judgment and insight.  He is awake but he is not alert and oriented x 3. Normal mood and appropriate affect.   Data Reviewed: I have personally reviewed following labs and imaging studies  CBC: Recent Labs  Lab 09/19/2020 1939 09/10/20 0500 09/11/20 0030  WBC 10.5 12.1* 12.9*  NEUTROABS  --   --  11.1*  HGB 12.9* 12.8* 12.4*  HCT 42.8 43.5 41.0  MCV 85.3 86.8 86.1  PLT 175 167 675   Basic Metabolic Panel: Recent Labs  Lab 09/07/2020 1939 09/10/20 0500 09/11/20 0030  NA 143 143 148*  K 5.3* 5.0 5.4*  CL 107 110 113*  CO2 18* 19* 17*  GLUCOSE 165* 158* 156*  BUN 52* 55* 62*  CREATININE 1.75* 1.71* 1.62*  CALCIUM 9.4 8.9 9.2  MG  --  2.7* 2.7*  PHOS  --  6.5* 5.3*   GFR: Estimated Creatinine Clearance: 30.9 mL/min (A) (by C-G formula based on SCr of 1.62 mg/dL (H)). Liver Function Tests: Recent Labs  Lab 09/08/2020 1939 09/11/20 0030  AST 51*  --   ALT 33  --   ALKPHOS 79  --   BILITOT 2.3*  --   PROT 6.9  --   ALBUMIN 3.9 3.5   No results for input(s): LIPASE, AMYLASE in the last 168  hours. No results for input(s): AMMONIA in the last 168 hours. Coagulation Profile: No results for input(s): INR, PROTIME in the last 168 hours. Cardiac Enzymes: Recent Labs  Lab 09/02/2020 1939  CKTOTAL 794*   BNP (last 3 results) No results for input(s): PROBNP in the last 8760 hours. HbA1C: No results for input(s): HGBA1C in the last 72 hours. CBG: Recent Labs  Lab 09/07/2020 1919  GLUCAP 158*   Lipid Profile: No results for input(s): CHOL, HDL, LDLCALC, TRIG, CHOLHDL, LDLDIRECT in the last 72 hours. Thyroid Function Tests: No results for input(s): TSH, T4TOTAL, FREET4, T3FREE, THYROIDAB in the last 72 hours. Anemia Panel: No results for input(s): VITAMINB12, FOLATE, FERRITIN, TIBC, IRON, RETICCTPCT in the last 72 hours. Sepsis Labs: Recent Labs  Lab 09/06/2020 2055 09/01/2020 2346  LATICACIDVEN 3.4* 1.5    Recent Results (from the past 240 hour(s))  Urine culture     Status: Abnormal   Collection Time: 09/20/2020  7:05 PM   Specimen: Urine, Clean Catch  Result Value Ref Range Status   Specimen Description   Final    URINE, CLEAN CATCH Performed at [facility], [place] [address] [place], [place] [address]    Special Requests   Final    NONE Performed at [facility], [place] [address] [place], [place] [address]    Culture MULTIPLE SPECIES PRESENT, SUGGEST RECOLLECTION (A)  Final   Report Status 09/11/2020 FINAL  Final  Culture, blood (single) w Reflex to ID Panel     Status: Abnormal (Preliminary result)   Collection Time: 09/08/2020  7:20 PM   Specimen: BLOOD RIGHT FOREARM  Result Value Ref Range Status   Specimen Description   Final    BLOOD RIGHT FOREARM Performed at [facility], [place] [address], [place], [place]  [address]    Special Requests   Final    BOTTLES DRAWN AEROBIC AND ANAEROBIC Blood Culture results may not be optimal due to an inadequate volume of blood received in culture bottles Performed at [facility], [place] [address], [place], [place] [address]    Culture  Setup Time   Final    AEROBIC BOTTLE ONLY GRAM NEGATIVE RODS Organism ID to follow CRITICAL RESULT CALLED TO, READ BACK BY AND VERIFIED WITH: Seleta Rhymes [facility] 09/10/20 2303 JDW    Culture (A)  Final    PSEUDOMONAS LUTEOLA SUSCEPTIBILITIES TO FOLLOW Performed at [facility], [place] [address], [place], [place] [address]    Report Status PENDING  Incomplete  Blood Culture ID Panel (Reflexed)     Status: None   Collection Time: 09/12/2020  7:20 PM  Result Value Ref Range Status   Enterococcus faecalis NOT DETECTED NOT DETECTED Final   Enterococcus Faecium NOT DETECTED NOT DETECTED Final   Listeria monocytogenes NOT DETECTED NOT DETECTED Final   Staphylococcus species NOT DETECTED NOT DETECTED Final   Staphylococcus aureus (BCID) NOT DETECTED NOT DETECTED Final   Staphylococcus epidermidis NOT DETECTED NOT DETECTED Final   Staphylococcus lugdunensis NOT DETECTED NOT DETECTED Final   Streptococcus species NOT DETECTED NOT DETECTED Final   Streptococcus agalactiae NOT DETECTED NOT DETECTED Final   Streptococcus pneumoniae NOT DETECTED NOT DETECTED Final   Streptococcus pyogenes NOT DETECTED NOT DETECTED Final   A.calcoaceticus-baumannii NOT DETECTED NOT DETECTED Final   Bacteroides fragilis NOT DETECTED NOT DETECTED Final   Enterobacterales NOT DETECTED NOT DETECTED Final   Enterobacter cloacae complex NOT DETECTED NOT DETECTED Final   Escherichia coli NOT DETECTED NOT DETECTED Final   Klebsiella aerogenes NOT DETECTED NOT DETECTED Final   Klebsiella oxytoca NOT DETECTED NOT DETECTED Final   Klebsiella pneumoniae NOT DETECTED NOT DETECTED Final   Proteus species NOT DETECTED NOT DETECTED Final   Salmonella species NOT DETECTED NOT DETECTED Final   Serratia marcescens NOT DETECTED NOT DETECTED Final   Haemophilus influenzae NOT DETECTED NOT DETECTED Final   Neisseria meningitidis NOT DETECTED NOT DETECTED  Final   Pseudomonas aeruginosa NOT DETECTED NOT DETECTED Final   Stenotrophomonas maltophilia NOT DETECTED NOT DETECTED Final   Candida albicans NOT DETECTED NOT DETECTED Final   Candida auris NOT DETECTED NOT DETECTED Final   Candida glabrata NOT DETECTED NOT DETECTED Final   Candida krusei NOT DETECTED NOT DETECTED Final   Candida parapsilosis NOT DETECTED NOT DETECTED Final   Candida tropicalis NOT DETECTED NOT DETECTED Final   Cryptococcus neoformans/gattii NOT DETECTED NOT DETECTED Final    Comment: Performed at [facility], [address] [place], [place] [address]  Respiratory Panel by RT PCR (Flu A&B, Covid) - Nasopharyngeal Swab     Status: None   Collection Time: 08/24/2020  8:57 PM   Specimen: Nasopharyngeal Swab  Result Value Ref Range Status   SARS Coronavirus 2 by RT PCR NEGATIVE NEGATIVE Final    Comment: (NOTE) SARS-CoV-2 target nucleic acids are NOT DETECTED.  The SARS-CoV-2 RNA is generally detectable in upper respiratoy specimens during the acute phase of infection. The lowest concentration of SARS-CoV-2 viral copies this assay can detect is 131 copies/mL. A negative result does not preclude SARS-Cov-2 infection and should not be used as the sole basis for treatment or other patient management decisions. A negative result may occur with  improper specimen collection/handling, submission of specimen other than nasopharyngeal swab, presence of viral  mutation(s) within the areas targeted by this assay, and inadequate number of viral copies (<131 copies/mL). A negative result must be combined with clinical observations, patient history, and epidemiological information. The expected result is Negative.  Fact Sheet for Patients:  PinkCheek.be  Fact Sheet for Healthcare Providers:  GravelBags.it  This test is no t yet approved or cleared by the [place] FDA and  has been authorized for detection  and/or diagnosis of SARS-CoV-2 by FDA under an Emergency Use Authorization (EUA). This EUA will remain  in effect (meaning this test can be used) for the duration of the COVID-19 declaration under Section 564(b)(1) of the Act, 21 U.S.C. section 360bbb-3(b)(1), unless the authorization is terminated or revoked sooner.     Influenza A by PCR NEGATIVE NEGATIVE Final   Influenza B by PCR NEGATIVE NEGATIVE Final    Comment: (NOTE) The Xpert Xpress SARS-CoV-2/FLU/RSV assay is intended as an aid in  the diagnosis of influenza from Nasopharyngeal swab specimens and  should not be used as a sole basis for treatment. Nasal washings and  aspirates are unacceptable for Xpert Xpress SARS-CoV-2/FLU/RSV  testing.  Fact Sheet for Patients: PinkCheek.be  Fact Sheet for Healthcare Providers: GravelBags.it  This test is not yet approved or cleared by the [place] FDA and  has been authorized for detection and/or diagnosis of SARS-CoV-2 by  FDA under an Emergency Use Authorization (EUA). This EUA will remain  in effect (meaning this test can be used) for the duration of the  Covid-19 declaration under Section 564(b)(1) of the Act, 21  U.S.C. section 360bbb-3(b)(1), unless the authorization is  terminated or revoked. Performed at [facility], [place] [address], [place], [place] [address]   Culture, blood (routine x 2)     Status: None (Preliminary result)   Collection Time: 09/11/20 12:30 AM   Specimen: BLOOD  Result Value Ref Range Status   Specimen Description   Final    BLOOD RIGHT WRIST Performed at [place] [address], [place], [place] [address]    Special Requests   Final    BOTTLES DRAWN AEROBIC ONLY Blood Culture adequate volume Performed at [place] [address] [place], [place] [address]    Culture   Final    NO GROWTH < 12 HOURS Performed at [place] [address] [place], [place] [address]    Report Status PENDING  Incomplete  Culture, blood (routine x 2)     Status: None (Preliminary result)   Collection Time: 09/11/20 12:30 AM   Specimen: BLOOD  Result Value Ref Range Status   Specimen Description   Final    BLOOD RIGHT ANTECUBITAL Performed at [place] [address] [place], [place] [address]    Special Requests   Final    BOTTLES DRAWN AEROBIC AND ANAEROBIC Blood Culture adequate volume Performed at [facility] [address], [place], [place] [address]    Culture   Final    NO GROWTH < 12 HOURS Performed at [place] [address], [place], [place] [address]    Report Status PENDING  Incomplete    RN Pressure Injury Documentation:     Estimated body mass index is 22.98 kg/m as calculated from the following:   Height as of this encounter: _0  (1.753 m).   Weight as of this encounter: 70.6 kg.  Malnutrition Type:     Malnutrition Characteristics:  Nutrition Interventions:    Radiology Studies: CT ABDOMEN PELVIS WO CONTRAST  Result Date: 09/05/2020 CLINICAL DATA:  86 year old male with weakness, altered mental status. Chest and abdominal pain. EXAM: CT CHEST, ABDOMEN AND PELVIS WITHOUT CONTRAST TECHNIQUE: Multidetector CT imaging of the chest, abdomen and pelvis was performed following the standard protocol without IV contrast. COMPARISON:  Portable chest radiograph today. FINDINGS: CT CHEST FINDINGS Cardiovascular: Calcified coronary artery and aortic atherosclerosis. Cardiomegaly. No pericardial effusion. Vascular patency is not evaluated in the absence of IV contrast. Mediastinum/Nodes: No mediastinal or hilar lymphadenopathy is evident. Lungs/Pleura: Small layering left pleural effusion with simple fluid density favoring transudate. Trace layering right pleural effusion. Mild respiratory motion. Major airways are patent but with  atelectatic changes. Bilateral peripheral and dependent pulmonary reticular and ground-glass opacity. Some associated septal thickening mostly in the lung apices. No consolidation. Musculoskeletal: Osteopenia. Mild T12 superior endplate Schmorl's node. No acute osseous abnormality identified. CT ABDOMEN PELVIS FINDINGS Hepatobiliary: Negative noncontrast liver and gallbladder. Pancreas: Atrophied. Spleen: Negative. Adrenals/Urinary Tract: Normal adrenal glands. Benign small renal cysts suspected in both kidneys. No hydronephrosis or hydroureter. There is a punctate right lower pole renal calculus on coronal image 80. A small bladder dome diverticulum is suspected on series 3, image 110. Otherwise the bladder is within normal limits. Stomach/Bowel: Low-density retained stool in the rectum. Mild motion artifact in the mid abdomen. There is irregular wall thickening of large bowel at the junction of the sigmoid and rectum (coronal images 68 through 78) measuring up to 15 mm in thickness over a segment of about 7 cm. There is regional dependent presacral stranding although no directly adjacent pericolonic stranding. No regional lymphadenopathy. Redundant sigmoid colon. Decompressed descending colon. Redundant transverse colon with retained stool. Similar retained stool in the right colon. No other large bowel wall thickening identified. Diminutive or absent appendix. Motion artifact at the ileocecal valve. Terminal ileum appears negative. No dilated small bowel. Decompressed stomach. No free air. No free fluid in the abdomen. No small bowel mesenteric stranding. Vascular/Lymphatic: Aortoiliac calcified atherosclerosis. Normal caliber aorta. Vascular patency is not evaluated in the absence of IV contrast. No lymphadenopathy. Reproductive: Negative. Other: Mild presacral stranding.  No overt pelvic free fluid. Musculoskeletal: Diffuse lumbar vacuum disc. Lumbar scoliosis. Osteopenia. No acute osseous abnormality  identified. IMPRESSION: 1. Irregular wall thickening at the junction of the sigmoid colon and rectum over a segment of about 7 cm appears indeterminate for Tumor versus Proctitis. Regional presacral inflammatory stranding. No lymphadenopathy. 2. Cardiomegaly with small layering left and trace right pleural effusions. Bilateral pulmonary reticular and ground-glass opacity could reflect interstitial edema and/or atelectasis superimposed on a degree of chronic lung disease. Viral/atypical respiratory infection is felt less likely. 3. No other acute or inflammatory process identified in the non-contrast chest, abdomen, or pelvis. 4. Calcified coronary artery and Aortic Atherosclerosis (ICD10-I70.0). Right nephrolithiasis. Electronically Signed   By: Genevie Ann M.D.   On: 08/26/2020 21:49   CT HEAD WO CONTRAST  Result Date: 09/18/2020 CLINICAL DATA:  Generalized weakness, altered mental status EXAM: CT HEAD WITHOUT CONTRAST TECHNIQUE: Contiguous axial images were obtained from the base of the skull through the vertex without intravenous contrast. COMPARISON:  None. FINDINGS: Brain: There is atrophy and chronic small vessel disease changes. No acute intracranial abnormality. Specifically, no hemorrhage, hydrocephalus, mass lesion, acute infarction, or significant intracranial injury. Vascular: No hyperdense vessel or unexpected calcification. Skull: No acute calvarial abnormality. Sinuses/Orbits: Visualized paranasal sinuses and mastoids clear. Orbital soft tissues unremarkable. Other: None IMPRESSION: Atrophy,  chronic microvascular disease. No acute intracranial abnormality. Electronically Signed   By: Rolm Baptise M.D.   On: 09/14/2020 21:37   CT Chest Wo Contrast  Result Date: 09/06/2020 CLINICAL DATA:  86 year old male with weakness, altered mental status. Chest and abdominal pain. EXAM: CT CHEST, ABDOMEN AND PELVIS WITHOUT CONTRAST TECHNIQUE: Multidetector CT imaging of the chest, abdomen and pelvis was  performed following the standard protocol without IV contrast. COMPARISON:  Portable chest radiograph today. FINDINGS: CT CHEST FINDINGS Cardiovascular: Calcified coronary artery and aortic atherosclerosis. Cardiomegaly. No pericardial effusion. Vascular patency is not evaluated in the absence of IV contrast. Mediastinum/Nodes: No mediastinal or hilar lymphadenopathy is evident. Lungs/Pleura: Small layering left pleural effusion with simple fluid density favoring transudate. Trace layering right pleural effusion. Mild respiratory motion. Major airways are patent but with atelectatic changes. Bilateral peripheral and dependent pulmonary reticular and ground-glass opacity. Some associated septal thickening mostly in the lung apices. No consolidation. Musculoskeletal: Osteopenia. Mild T12 superior endplate Schmorl's node. No acute osseous abnormality identified. CT ABDOMEN PELVIS FINDINGS Hepatobiliary: Negative noncontrast liver and gallbladder. Pancreas: Atrophied. Spleen: Negative. Adrenals/Urinary Tract: Normal adrenal glands. Benign small renal cysts suspected in both kidneys. No hydronephrosis or hydroureter. There is a punctate right lower pole renal calculus on coronal image 80. A small bladder dome diverticulum is suspected on series 3, image 110. Otherwise the bladder is within normal limits. Stomach/Bowel: Low-density retained stool in the rectum. Mild motion artifact in the mid abdomen. There is irregular wall thickening of large bowel at the junction of the sigmoid and rectum (coronal images 68 through 78) measuring up to 15 mm in thickness over a segment of about 7 cm. There is regional dependent presacral stranding although no directly adjacent pericolonic stranding. No regional lymphadenopathy. Redundant sigmoid colon. Decompressed descending colon. Redundant transverse colon with retained stool. Similar retained stool in the right colon. No other large bowel wall thickening identified. Diminutive or  absent appendix. Motion artifact at the ileocecal valve. Terminal ileum appears negative. No dilated small bowel. Decompressed stomach. No free air. No free fluid in the abdomen. No small bowel mesenteric stranding. Vascular/Lymphatic: Aortoiliac calcified atherosclerosis. Normal caliber aorta. Vascular patency is not evaluated in the absence of IV contrast. No lymphadenopathy. Reproductive: Negative. Other: Mild presacral stranding.  No overt pelvic free fluid. Musculoskeletal: Diffuse lumbar vacuum disc. Lumbar scoliosis. Osteopenia. No acute osseous abnormality identified. IMPRESSION: 1. Irregular wall thickening at the junction of the sigmoid colon and rectum over a segment of about 7 cm appears indeterminate for Tumor versus Proctitis. Regional presacral inflammatory stranding. No lymphadenopathy. 2. Cardiomegaly with small layering left and trace right pleural effusions. Bilateral pulmonary reticular and ground-glass opacity could reflect interstitial edema and/or atelectasis superimposed on a degree of chronic lung disease. Viral/atypical respiratory infection is felt less likely. 3. No other acute or inflammatory process identified in the non-contrast chest, abdomen, or pelvis. 4. Calcified coronary artery and Aortic Atherosclerosis (ICD10-I70.0). Right nephrolithiasis. Electronically Signed   By: Genevie Ann M.D.   On: 09/08/2020 21:49   CT CERVICAL SPINE WO CONTRAST  Result Date: 09/07/2020 CLINICAL DATA:  Generalized weakness, altered mental status EXAM: CT CERVICAL SPINE WITHOUT CONTRAST TECHNIQUE: Multidetector CT imaging of the cervical spine was performed without intravenous contrast. Multiplanar CT image reconstructions were also generated. COMPARISON:  None FINDINGS: Alignment: Slight degenerative retrolisthesis of C4 on C5. Skull base and vertebrae: No acute fracture. No primary bone lesion or focal pathologic process. Soft tissues and spinal canal: No prevertebral fluid or swelling. No  visible  canal hematoma. Disc levels: Diffuse moderate degenerative disc disease and facet disease bilaterally, left greater than right. Upper chest: Layering bilateral effusions noted. Other: None IMPRESSION: Diffuse degenerative disc and facet disease. No acute bony abnormality. Layering bilateral pleural effusions. Electronically Signed   By: Rolm Baptise M.D.   On: 08/23/2020 21:39   DG Chest Port 1 View  Result Date: 08/30/2020 CLINICAL DATA:  Weakness and altered mental status. EXAM: PORTABLE CHEST 1 VIEW COMPARISON:  None. FINDINGS: Mild, diffuse, chronic appearing increased interstitial lung markings are seen. Mild areas of atelectasis and/or infiltrate are also seen within the bilateral lung bases. There is mild to moderate severity enlargement of the cardiac silhouette. Mild calcification of the thoracic aorta is seen. The visualized skeletal structures are unremarkable. IMPRESSION: 1. Mild bibasilar atelectasis and/or infiltrate. 2. Mild, diffuse, chronic appearing increased interstitial lung markings. 3. Mild to moderate severity cardiomegaly. Electronically Signed   By: Virgina Norfolk M.D.   On: 09/07/2020 20:13   Scheduled Meds: . mouth rinse  15 mL Mouth Rinse BID  . sodium chloride flush  3 mL Intravenous Q12H   Continuous Infusions:   LOS: 1 day   Kerney Elbe, DO [facility] PAGER is on AMION  If 7PM-7AM, please contact night-coverage [email or web address]

## 2020-09-11 NOTE — Progress Notes (Signed)
Daily Progress Note   Patient Name: John Mason       Date: 09/11/2020 DOB: Jul 08, 1929  Age: 86 y.o. MRN#: [id number] Attending Physician: Merlene Laughter, DO Primary Care Physician: Sharon Seller, NP Admit Date: 08/29/2020  Reason for Consultation/Follow-up: Establishing goals of care  Subjective: I saw and examined John Mason today.  His daughter, John Mason, is at the bedside.    John Mason reports speaking with Dr. Marland Mcalpine this morning and plan for change in antibiotics.    She reports that she and her father discussed regarding end of life included his desire not to suffer and not to be placed in long term care facility.  She is tearful as she feels that he is suffering at this point and is "tired and ready to go."  We discussed difference between a aggressive medical intervention path and a palliative, comfort focused care path.  Discussed options of focus only on things for comfort vs continuation of current interventions and reassessing his situation tomorrow.    John Mason feels that her father has been clear that he is ready and that his wish would be to transition to full comfort care.  We discussed changes in care plan associated with this and she was in agreement with recommendations.  Length of Stay: 1  Current Medications: Scheduled Meds:  . mouth rinse  15 mL Mouth Rinse BID  . sodium chloride flush  3 mL Intravenous Q12H    Continuous Infusions:   PRN Meds: acetaminophen **OR** acetaminophen, antiseptic oral rinse, glycopyrrolate **OR** glycopyrrolate **OR** glycopyrrolate, haloperidol **OR** haloperidol **OR** haloperidol lactate, HYDROmorphone (DILAUDID) injection, LORazepam **OR** LORazepam **OR** LORazepam, ondansetron **OR** ondansetron (ZOFRAN) IV, polyvinyl  alcohol  Physical Exam         General: Restless and agitated, periods of apnea noted HEENT: No bruits, no goiter, no JVD, oral mucosa dry Heart: Regular rate and rhythm. No murmur appreciated. Lungs: Decreased air movement, Scattered coarse Abdomen: Soft, nontender, nondistended, positive bowel sounds.  Ext: + edema Skin: Warm and dry Neuro: agitated and restless  Vital Signs: BP (!) 181/67 (BP Location: Right Arm)   Pulse 81   Temp 98.4 F (36.9 C) (Oral)   Resp 20   Ht 5\' 9"  (1.753 m)   Wt 70.6 kg   SpO2 97%   BMI 22.98 kg/m  SpO2: SpO2: 97 % O2 Device: O2 Device: Nasal Cannula O2 Flow Rate: O2 Flow Rate (L/min): 4 L/min  Intake/output summary:   Intake/Output Summary (Last 24 hours) at 09/11/2020 1122 Last data filed at 09/11/2020 4098 Gross per 24 hour  Intake 672.23 ml  Output 700 ml  Net -27.77 ml   LBM: Last BM Date: 09/10/20 Baseline Weight: Weight: 70.6 kg Most recent weight: Weight: 70.6 kg       Palliative Assessment/Data:    Flowsheet Rows     Most Recent Value  Intake Tab  Referral Department Hospitalist  Unit at Time of Referral ER  Palliative Care Primary Diagnosis Sepsis/Infectious Disease  Date Notified 09/01/2020  Palliative Care Type New Palliative care  Reason for referral Clarify Goals of Care  Date of Admission 08/27/2020  Date first seen by Palliative Care 09/10/20  # of days Palliative referral response time 1 Day(s)  # of days IP prior to Palliative referral 0  Clinical Assessment  Palliative Performance Scale Score 10%  Psychosocial & Spiritual Assessment  Palliative Care Outcomes  Patient/Family meeting held? Yes  Who was at the meeting? Daughter via phone      Patient Active Problem List   Diagnosis Date Noted  . Abnormal CT of the abdomen   . Dehydration   . Acute encephalopathy 09/04/2020  . BPH (benign prostatic hyperplasia) 08/29/2020  . Paroxysmal atrial fibrillation (HCC) 09/03/2020  . Chronic combined systolic  and diastolic CHF (congestive heart failure) (HCC) 09/16/2020  . Hyperkalemia 08/28/2020  . Proctitis 09/15/2020  . Cough 08/26/2016  . Edema 08/26/2016  . Glaucoma 04/15/2016  . Anemia 04/15/2016  . Memory loss 04/15/2016  . HLD (hyperlipidemia) 04/15/2016  . Actinic keratosis 12/17/2015  . Syncope 05/22/2014  . CAD (coronary artery disease) 05/22/2014  . Bradycardia 05/22/2014  . AKI (acute kidney injury) (HCC) 05/22/2014  . Essential hypertension 10/31/2013  . Urgency incontinence 10/31/2013  . DM type 2 (diabetes mellitus, type 2) (HCC) 03/29/2013  . Unspecified vitamin D deficiency   . Benign essential tremor     Palliative Care Assessment & Plan   Patient Profile: 86 y.o. male  with past medical history of CAD s/p PCI, chronic diastolic CHF with EF 40%, paroxysmal A. fib not on anticoagulation due to GI bleed, severe aortic regurg, hypertension, hyperlipidemia, BPH admitted on 09/05/2020 with altered mental status and weakness.  Work-up revealed likely proctitis and he was started on empiric antibiotics as he also has reported history of diarrhea.  GI has been consulted as well.  He also has hyperkalemia as well as AKI.  In the ED, he remains somnolent other than periods of agitation.  Palliative consulted for goals of care.  Recommendations/Plan:  DNR/DNI  John Mason appears to be actively dying.  He does not appear comfortable on my exam today.  His daughter also states that he appears to be suffering.  We discussed options and she would like to focus only on comfort for her father moving forward.  Pain/SOB: dilaudid as needed   Anxiety: Ativan as needed  Agitaiton/nausea: Haldol as needed  Excess secretions: Robinul as needed  Visitation per end of life policy.  If he survives the night and appears stable enough to consider transport, we are planning to discuss options for hospice tomorrow.  I do think that there is high likelihood that this will be a hospital  death.  Goals of Care and Additional Recommendations:  Limitations on Scope of Treatment: Full Comfort Care  Code Status:  Code Status Orders  (From admission, onward)         Start     Ordered   09/11/20 1119  Do not attempt resuscitation (DNR)  Continuous       Question Answer Comment  In the event of cardiac or respiratory ARREST Do not call a "code blue"   In the event of cardiac or respiratory ARREST Do not perform Intubation, CPR, defibrillation or ACLS   In the event of cardiac or respiratory ARREST Use medication by any route, position, wound care, and other measures to relive pain and suffering. May use oxygen, suction and manual treatment of airway obstruction as needed for comfort.      09/11/20 1120        Code Status History    Date Active Date Inactive Code Status Order ID Comments User Context   2020/09/17 2348 09/11/2020 1120 DNR [id number]  Charlsie Quest, MD ED   05/22/2014 1638 05/24/2014 1739 Full Code [id number]  Clydia Llano, MD ED   05/07/2014 0608 05/11/2014 1714 Full Code [id number]  Yates Decamp, MD Inpatient   05/07/2014 0602 05/07/2014 0608 Full Code [id number]  Yates Decamp, MD Inpatient   Advance Care Planning Activity    Advance Directive Documentation     Most Recent Value  Type of Advance Directive Out of facility DNR (pink MOST or yellow form)  Pre-existing out of facility DNR order (yellow form or pink MOST form) Physician notified to receive inpatient order  "MOST" Form in Place? --       Prognosis:   Hours - Days  Discharge Planning:  Anticipated Hospital Death  Care plan was discussed with daughter, RN, Dr. Marland Mcalpine  Thank you for allowing the Palliative Medicine Team to assist in the care of this patient.   Time In: 1050 Time Out: 1130 Total Time 40 Prolonged Time Billed No      Greater than 50%  of this time was spent counseling and coordinating care related to the above assessment and plan.  Romie Minus, MD  Please contact  Palliative Medicine Team phone at [phone] for questions and concerns.

## 2020-09-12 DIAGNOSIS — Z7189 Other specified counseling: Secondary | ICD-10-CM | POA: Diagnosis not present

## 2020-09-12 DIAGNOSIS — R7881 Bacteremia: Secondary | ICD-10-CM | POA: Diagnosis not present

## 2020-09-12 DIAGNOSIS — I5042 Chronic combined systolic (congestive) and diastolic (congestive) heart failure: Secondary | ICD-10-CM | POA: Diagnosis not present

## 2020-09-12 DIAGNOSIS — K6289 Other specified diseases of anus and rectum: Secondary | ICD-10-CM | POA: Diagnosis not present

## 2020-09-12 DIAGNOSIS — R935 Abnormal findings on diagnostic imaging of other abdominal regions, including retroperitoneum: Secondary | ICD-10-CM | POA: Diagnosis not present

## 2020-09-12 DIAGNOSIS — G9340 Encephalopathy, unspecified: Secondary | ICD-10-CM | POA: Diagnosis not present

## 2020-09-12 DIAGNOSIS — N179 Acute kidney failure, unspecified: Secondary | ICD-10-CM | POA: Diagnosis not present

## 2020-09-12 DIAGNOSIS — N400 Enlarged prostate without lower urinary tract symptoms: Secondary | ICD-10-CM | POA: Diagnosis not present

## 2020-09-12 LAB — CULTURE, BLOOD (SINGLE)

## 2020-09-12 MED ORDER — HYDROMORPHONE BOLUS VIA INFUSION
0.5000 mg | INTRAVENOUS | Status: DC | PRN
  Administered 2020-09-12: 0.5 mg via INTRAVENOUS
  Filled 2020-09-12: qty 1

## 2020-09-12 MED ORDER — SODIUM CHLORIDE 0.9 % IV SOLN
1.0000 mg/h | INTRAVENOUS | Status: DC
  Administered 2020-09-12: 0.5 mg/h via INTRAVENOUS
  Filled 2020-09-12: qty 2.5

## 2020-09-12 NOTE — Progress Notes (Signed)
PROGRESS NOTE    John Mason  MRN:[id number] DOB: 08/12/1929 DOA: 09/10/2020 PCP: Eubanks, Jessica K, NP   Brief Narrative:  HPI per Dr. Vishal Patel on 08/23/2020 John Mason is a 86 y.o. male with medical history significant for CAD s/p PCI, chronic diastolic CHF (EF 40%, G2 DD on TTE 04/24/2020), paroxysmal atrial fibrillation not on anticoagulation due to GI bleeding, severe aortic regurgitation, HTN, HLD, BPH who presents to the ED for evaluation of progressive altered mental status and generalized weakness.  Patient is unable to provide history due to somnolence therefore majority of history is obtained from daughter at bedside, EDP, and chart review.  Daughter states that over the last several months patient has had some memory deficits however has been otherwise functioning well.  She says he has chronic loose stools however about a week ago he had significantly more frequent diarrhea.  Over the last 2 days he has had a significant change in which he has been very fatigued.  He has been staying in bed mostly, only occasionally getting up into a chair.  He has not been eating and therefore has not had any further bowel movements last 2 days.  He has been much less interactive and not communicating as he normally has.  He has been generally weak with a fall at home.  He did not have any significant injury.   He has not been communicating his needs and only stated that he hurt all over.  Daughter states that he was recently given home oxygen to use at night, however patient says he has not wanted to use it.  Daughter says that he was very agitated on arrival to the ED however is now resting peacefully after receiving medications for anxiety/agitation.  ED Course:  Initial vitals showed BP 141/97, pulse 96, RR 20, temp 97.9 Fahrenheit, SPO2 98% on room air.  Labs show WBC 10.5, hemoglobin 12.9, platelets 175,000, sodium 143, potassium 5.3, bicarb 18, BUN 52, creatinine 1.75, serum  glucose 165, AST 51, ALT 33, alk phos 79, total bilirubin 2.3, CK 794 high-sensitivity troponin I 57 > 49, lactic acid 3.4.  Urinalysis showed negative nitrites, negative leukocytes, >50 RBC/hpf, 0-5 WBC/hpf, rare bacteria microscopy.  VBG showed pH 7.337, PCO2 31.2, PO2 86.1.  Portable chest x-ray showed mild bibasilar atelectasis, chronic appearing interstitial lung markings, and cardiomegaly.  CT head without contrast showed atrophy and chronic microvascular disease without acute intracranial abnormality.  CT cervical spine without contrast showed diffuse degenerative disc and facet disease without acute bony abnormality.  CT chest/abdomen/pelvis without contrast showed irregular wall thickening at the junction of the sigmoid colon and rectum over 7 cm segment indeterminate for tumor versus proctitis.  Cardiomegaly with small/trace bilateral pleural effusions noted.  Bilateral pulmonary reticular and groundglass opacities also seen felt to reflect interstitial edema versus atelectasis superimposed on degree of chronic lung disease.  No other acute or inflammatory process identified in the chest/abdomen/pelvis.  Patient was given IV ceftriaxone and Flagyl.  Patient was agitated in the ED and received 1 mg IV Ativan and 5 mg IM Haldol.  He also received 1 L normal saline.  The hospitalist service was consulted to admit for further evaluation and management.  **Interim History Further work-up revealed that patient had a Pseudomonas Luteola bacteremia.  Yesterday (09/11/20) His labs were worsened somewhat with minimal improvement in his renal function.  After further goals of care discussion the patient and daughter elected to transition the patient to full comfort measures   and if patient survives the night and is stable for the morning can be discharged to residential hospice however we expect in-hospital death given his poor prognosis and overall deterioration.  Patient appears uncomfortable  on examination and comfort measures have been enacted and he has been placed on Dilaudid, Ativan, Haldol, as well as glycopyrrolate for comfort measures and all other measures have been discontinued.  The patient is resting but is having short and shallow breathing and after discussion with palliative care we will change him to a Dilaudid drip for further comfort.  Anticipate in-hospital death as prognosis extremely poor and guarded and he is a high risk for further decompensation.  Assessment & Plan:   Principal Problem:   Acute encephalopathy Active Problems:   Essential hypertension   CAD (coronary artery disease)   AKI (acute kidney injury) (HCC)   HLD (hyperlipidemia)   BPH (benign prostatic hyperplasia)   Paroxysmal atrial fibrillation (HCC)   Chronic combined systolic and diastolic CHF (congestive heart failure) (HCC)   Hyperkalemia   Proctitis   Abnormal CT of the abdomen   Dehydration  Abnormal CT abdomen and pelvis/concern for proctitis versus tumor -Patient had presented with altered mental status, acute kidney injury and concern for proctitis.   -CT abdomen and pelvis which was done showed changes concerning for proctitis versus tumor at the junction of the sigmoid colon and rectum with a segment of 7 cm.   -Lactic acid noted to be elevated.  Patient placed on IV fluids for hydration and Lactic acid levels improved down to 1.5.  Leukocytosis has gone from 10.5 -> 12.1 and worsened to 12.9.  -Continued IV Rocephin and IV Flagyl but escalated to IV Cefepime given Pseudomonas Luteola Bacteremia.   -If patient developed loose stools would have checked a C. difficile PCR and GI pathogen panel. Per admitting MD patient with no bowel movement in the past 2 days.   -Consult with GI for further evaluation and management felt that the patient had a complex clinical picture but essentially it appeared to be either inflammatory rectosigmoid process or more likely a malignancy and they  recommended entirely with a conservative plan on this poor debilitated patient and recommended palliative measures. -Palliative consulted for further evaluation and goals of care discussion and after further discussion with the patient's daughter she is elected to transition the patient to comfort measures given that he he appeared to be actively dying.  All medications not in the patient's comfort have been discontinued and he has been given Dilaudid as needed for pain or shortness breath, Ativan for anxiety, Haldol for agitation nausea as well as Robinul for excessive secretions.  Patient does not appear stable enough to consider transport to hospice and anticipating in hospital death given his extremely poor prognosis  Pseudomonas Luteola Bacteremia -Noted on blood cultures from 09/19/2020 -As above; antibiotics were escalated IV cefepime however patient has been transitioned to comfort measures only now and all medications not in the patient's comfort has been discontinued and he will be started on a Dilaudid drip with as needed Dilaudid IV as needed  Acute Metabolic Encephalopathy -In the setting of underlying cognitive impairment at baseline.   -Patient noted to have a worsening confusion over the past 2 days likely secondary to concern for proctitis, severe dehydration, acute kidney injury.   -Continued hydration with IV fluids but had stopped IV sodium chloride and started D5W given his hyperkalemia and hyperchloremia -Empiric IV antibiotics were changed but now has been discontinued -Continue   with supportive care given the patient has a very high risk for decompensation and likely has a in-hospital death  Acute Kidney Injury -Secondary to a prerenal azotemia secondary to hypovolemia/dehydration in the setting of GI losses and ARB and spironolactone.   -ARB and spironolactone on hold and will continue to Hold.   -Urinalysis nitrite negative, leukocytes negative, 100 protein.  Urine sodium  140.  Urine creatinine 143.87.   -CT abdomen and pelvis with no noted hydronephrosis.   -Renal function with minimal improvement from admission as BUN/Cr went from 52/1.75 -> 55/1.71 -> 62/1.62 -Increased IV fluids to 100 cc an hour but changed to D5W at 75 mL/hr given Hypernatremia, Hyperchloremia, and Hyperkalemia but this is now stopped as he is not now full comfort measures -Patient now full comfort measures and on Dilaudid drip and will not  Hyperkalemia -In the setting of acute kidney injury and ARB and spironolactone use.   -ARB and spironolactone on hold.   -Improved with hydration but worsened back to 5.4 -Changed IVF to D5W and gave a dose of Lokelma but will not check anymore as patient is being transitioned to FULL Comfort Measures and will stop IVF and not recheck labs  Hyperphosphatemia -Patient's phosphorus level from 6.5 and slightly improved to 5.3  -We will not recheck given patient is being transitioned to comfort measures  Hypermagnesemia -Patient magnesium levels 2.7 -We will not recheck as patient is being transitioned to comfort measures  Hypernatremia/Hyperchloremia -Patient sodium went from 143 is now 148 and chloride level went from 107 and is now 113 -Setting of normal saline resuscitation -Changed IV fluids from normal saline to D5W and change the rate to 75 MLS per hour however because of the shift of focus to comfort measures IV fluids will now be discontinued and will not check CMP again in the morning if the patient survives  Chronic Combined Systolic and Diastolic CHF Coronary artery disease status post PCI -Last 2D echo with a EF of 40%, grade 2 diastolic dysfunction.   -Patient volume depleted, dehydrated.   -Continued hydration with IV fluids but changed to above.  -Continue to hold spironolactone and ARB.  Monitor volume status closely with hydration.   -Continue statin.  Not on aspirin due to recent GI bleed.  -As Above patient is being  transitioned to FULL Comfort Measures  Paroxysmal atrial fibrillation -Recently diagnosed within the last 6 months.   -Patient currently somnolent and likely unable to take any oral intake at this time.   -Placed on IV Lopressor 2.5 mg every 6 hours for rate control.   -Not on anticoagulation aspirin due to recent GI bleed.  -Patient is being transitioned to Comfort Measures   Hypertension -Placed on IV metoprolol but this will be discontinued as he is being transitioned to FULL Comfort Measures    BPH -Proscar one patient more alert and tolerating oral intake.  Dehydration -IV fluids now discontinued   Generalized Weakness/Deconditioning Goals of Care -Patient noted to have significant deconditioning, failure to thrive prior to admission.   -Admitting physician discussed with patient's daughter on admission in terms of patient's deterioration.   -Patient currently DNR/DNI.   -Patient was monitored over the next 24 to 48 hours and since he had no significant clinical improvement Daughter decided to shift focus to comfort measures.   -Palliative care consulted and appreciate GOC discussion   DVT prophylaxis: None; Patient is now Comfort Care Code Status: DO NOT RESUSCITATE Family Communication: Discussed with family at bedside   Disposition Plan: Residential hospice this patient is stable enough to be transported tomorrow but likely anticipate in-hospital death given his poor prognosis and current deterioration  Status is: Inpatient  Remains inpatient appropriate because:Ongoing diagnostic testing needed not appropriate for outpatient work up, Unsafe d/c plan, IV treatments appropriate due to intensity of illness or inability to take PO and Inpatient level of care appropriate due to severity of illness   Dispo: The patient is from: Home              Anticipated d/c is to: Inpatient Hospital Death              Anticipated d/c date is: 1-2 days              Patient currently is  not medically stable to d/c.  Consultants:   Gastroenterology  Palliative Care Medicine    Procedures: None  Antimicrobials:  Anti-infectives (From admission, onward)   Start     Dose/Rate Route Frequency Ordered Stop   09/11/20 1115  ceFEPIme (MAXIPIME) 2 g in sodium chloride 0.9 % 100 mL IVPB  Status:  Discontinued        2 g 200 mL/hr over 30 Minutes Intravenous Every 12 hours 09/11/20 1017 09/11/20 1118   09/10/20 2200  cefTRIAXone (ROCEPHIN) 2 g in sodium chloride 0.9 % 100 mL IVPB  Status:  Discontinued        2 g 200 mL/hr over 30 Minutes Intravenous Every 24 hours 09/07/2020 2349 09/11/20 1016   09/10/20 0800  metroNIDAZOLE (FLAGYL) IVPB 500 mg  Status:  Discontinued        500 mg 100 mL/hr over 60 Minutes Intravenous Every 8 hours 09/06/2020 2349 09/11/20 1118   08/26/2020 2215  cefTRIAXone (ROCEPHIN) 2 g in sodium chloride 0.9 % 100 mL IVPB        2 g 200 mL/hr over 30 Minutes Intravenous  Once 09/21/2020 2207 09/10/2020 2306   09/08/2020 2215  metroNIDAZOLE (FLAGYL) IVPB 500 mg        500 mg 100 mL/hr over 60 Minutes Intravenous  Once 08/30/2020 2207 09/10/20 0028        Subjective: Seen and examined and he is resting comfortably today and having short shallow breathing.  Is not awake enough to participate and provide a subjective history.  Family states that they had a rough time yesterday but he is more comfortable today.  Palliative care changing the patient to a Dilaudid drip and he is currently not stable to be transferred to hospice as expected in-hospital death is anticipated and imminent.  Objective: Vitals:   09/11/20 0231 09/11/20 0500 09/11/20 0519 09/11/20 0543  BP: (!) 172/105  (!) 154/78 (!) 181/67  Pulse: (!) 54  95 81  Resp: 18  (!) 22 20  Temp: (!) 97.4 F (36.3 C)  98.1 F (36.7 C) 98.4 F (36.9 C)  TempSrc:   Axillary Oral  SpO2: 94%  94% 97%  Weight:  70.6 kg    Height:  5' 9" (1.753 m)      Intake/Output Summary (Last 24 hours) at 09/12/2020  1133 Last data filed at 09/12/2020 0725 Gross per 24 hour  Intake 0 ml  Output 1000 ml  Net -1000 ml   Filed Weights   09/11/20 0500  Weight: 70.6 kg   Examination: Physical Exam:  Constitutional: Patient is an elderly Caucasian male currently resting comfortably and unable to provide a subjective history due to his current condition.  Does have short   shallow breathing Eyes: His eyes are closed ENMT: External Ears, Nose appear normal.  Neck: Appears normal, supple, no cervical masses, normal ROM, no appreciable thyromegaly; no appreciable JVD Respiratory: Diminished to auscultation bilaterally with slightly coarse breath sounds, no wheezing, rales, rhonchi or crackles. Normal respiratory effort and patient is not tachypenic. No accessory muscle use.  He is wearing supplemental oxygen via nasal cannula and has some short shallow breathing Cardiovascular: RRR, no murmurs / rubs / gallops. S1 and S2 auscultated. No extremity edema.  Abdomen: Soft, non-tender, non-distended. . Bowel sounds positive.  GU: Deferred. Musculoskeletal: No clubbing / cyanosis of digits/nails. No joint deformity upper and lower extremities.  Skin: No rashes, lesions, ulcers on to skin evaluation. No induration; Warm and dry.  Neurologic: He is not awake and following commands Psychiatric: Impaired judgment and insight.  He is not awake, alert or oriented  Data Reviewed: I have personally reviewed following labs and imaging studies  CBC: Recent Labs  Lab 09/04/2020 1939 09/10/20 0500 09/11/20 0030  WBC 10.5 12.1* 12.9*  NEUTROABS  --   --  11.1*  HGB 12.9* 12.8* 12.4*  HCT 42.8 43.5 41.0  MCV 85.3 86.8 86.1  PLT 175 167 443   Basic Metabolic Panel: Recent Labs  Lab 09/12/2020 1939 09/10/20 0500 09/11/20 0030  NA 143 143 148*  K 5.3* 5.0 5.4*  CL 107 110 113*  CO2 18* 19* 17*  GLUCOSE 165* 158* 156*  BUN 52* 55* 62*  CREATININE 1.75* 1.71* 1.62*  CALCIUM 9.4 8.9 9.2  MG  --  2.7* 2.7*  PHOS   --  6.5* 5.3*   GFR: Estimated Creatinine Clearance: 30.9 mL/min (A) (by C-G formula based on SCr of 1.62 mg/dL (H)). Liver Function Tests: Recent Labs  Lab 08/31/2020 1939 09/11/20 0030  AST 51*  --   ALT 33  --   ALKPHOS 79  --   BILITOT 2.3*  --   PROT 6.9  --   ALBUMIN 3.9 3.5   No results for input(s): LIPASE, AMYLASE in the last 168 hours. No results for input(s): AMMONIA in the last 168 hours. Coagulation Profile: No results for input(s): INR, PROTIME in the last 168 hours. Cardiac Enzymes: Recent Labs  Lab 08/29/2020 1939  CKTOTAL 794*   BNP (last 3 results) No results for input(s): PROBNP in the last 8760 hours. HbA1C: No results for input(s): HGBA1C in the last 72 hours. CBG: Recent Labs  Lab 08/30/2020 1919  GLUCAP 158*   Lipid Profile: No results for input(s): CHOL, HDL, LDLCALC, TRIG, CHOLHDL, LDLDIRECT in the last 72 hours. Thyroid Function Tests: No results for input(s): TSH, T4TOTAL, FREET4, T3FREE, THYROIDAB in the last 72 hours. Anemia Panel: No results for input(s): VITAMINB12, FOLATE, FERRITIN, TIBC, IRON, RETICCTPCT in the last 72 hours. Sepsis Labs: Recent Labs  Lab 09/11/2020 2055 09/01/2020 2346  LATICACIDVEN 3.4* 1.5    Recent Results (from the past 240 hour(s))  Urine culture     Status: Abnormal   Collection Time: 08/30/2020  7:05 PM   Specimen: Urine, Clean Catch  Result Value Ref Range Status   Specimen Description   Final    URINE, CLEAN CATCH Performed at [facility], [place] [address], [place], [place] [address]    Special Requests   Final    NONE Performed at [facility], [place] [address] [place], [place] [address]    Culture MULTIPLE SPECIES PRESENT, SUGGEST RECOLLECTION (A)  Final   Report Status 09/11/2020  FINAL  Final  Culture, blood (single) w Reflex to ID Panel     Status: Abnormal   Collection Time: 09/08/2020  7:20 PM   Specimen: BLOOD RIGHT FOREARM  Result Value Ref Range Status    Specimen Description   Final    BLOOD RIGHT FOREARM Performed at [facility], [place] [address], [place], [place] [address]    Special Requests   Final    BOTTLES DRAWN AEROBIC AND ANAEROBIC Blood Culture results may not be optimal due to an inadequate volume of blood received in culture bottles Performed at [facility] [address] [place], [place] [address]    Culture  Setup Time   Final    AEROBIC BOTTLE ONLY GRAM NEGATIVE RODS Organism ID to follow CRITICAL RESULT CALLED TO, READ BACK BY AND VERIFIED WITHSeleta Rhymes [facility] 09/10/20 2303 JDW Performed at [facility], [address] [place], [place] [address]    Culture PSEUDOMONAS LUTEOLA (A)  Final   Report Status 09/12/2020 FINAL  Final   Organism ID, Bacteria PSEUDOMONAS LUTEOLA  Final      Susceptibility   Pseudomonas luteola - MIC*    CEFTAZIDIME <=1 SENSITIVE Sensitive     CIPROFLOXACIN <=0.25 SENSITIVE Sensitive     GENTAMICIN <=1 SENSITIVE Sensitive     IMIPENEM <=0.25 SENSITIVE Sensitive     PIP/TAZO <=4 SENSITIVE Sensitive     * PSEUDOMONAS LUTEOLA  Blood Culture ID Panel (Reflexed)     Status: None   Collection Time: 09/14/2020  7:20 PM  Result Value Ref Range Status   Enterococcus faecalis NOT DETECTED NOT DETECTED Final   Enterococcus Faecium NOT DETECTED NOT DETECTED Final   Listeria monocytogenes NOT DETECTED NOT DETECTED Final   Staphylococcus species NOT DETECTED NOT DETECTED Final   Staphylococcus aureus (BCID) NOT DETECTED NOT DETECTED Final   Staphylococcus epidermidis NOT DETECTED NOT DETECTED Final   Staphylococcus lugdunensis NOT DETECTED NOT DETECTED Final   Streptococcus species NOT DETECTED NOT DETECTED Final   Streptococcus agalactiae NOT DETECTED NOT DETECTED Final   Streptococcus pneumoniae NOT DETECTED NOT DETECTED Final   Streptococcus pyogenes NOT DETECTED NOT DETECTED Final   A.calcoaceticus-baumannii NOT DETECTED NOT DETECTED Final   Bacteroides fragilis  NOT DETECTED NOT DETECTED Final   Enterobacterales NOT DETECTED NOT DETECTED Final   Enterobacter cloacae complex NOT DETECTED NOT DETECTED Final   Escherichia coli NOT DETECTED NOT DETECTED Final   Klebsiella aerogenes NOT DETECTED NOT DETECTED Final   Klebsiella oxytoca NOT DETECTED NOT DETECTED Final   Klebsiella pneumoniae NOT DETECTED NOT DETECTED Final   Proteus species NOT DETECTED NOT DETECTED Final   Salmonella species NOT DETECTED NOT DETECTED Final   Serratia marcescens NOT DETECTED NOT DETECTED Final   Haemophilus influenzae NOT DETECTED NOT DETECTED Final   Neisseria meningitidis NOT DETECTED NOT DETECTED Final   Pseudomonas aeruginosa NOT DETECTED NOT DETECTED Final   Stenotrophomonas maltophilia NOT DETECTED NOT DETECTED Final   Candida albicans NOT DETECTED NOT DETECTED Final   Candida auris NOT DETECTED NOT DETECTED Final   Candida glabrata NOT DETECTED NOT DETECTED Final   Candida krusei NOT DETECTED NOT DETECTED Final   Candida parapsilosis NOT DETECTED NOT DETECTED Final   Candida tropicalis NOT DETECTED NOT DETECTED Final   Cryptococcus neoformans/gattii NOT DETECTED NOT DETECTED Final    Comment: Performed at [facility], [address], [place], [place] [address]  Respiratory Panel by RT PCR (Flu A&B, Covid) - Nasopharyngeal  Swab     Status: None   Collection Time: 08/26/2020  8:57 PM   Specimen: Nasopharyngeal Swab  Result Value Ref Range Status   SARS Coronavirus 2 by RT PCR NEGATIVE NEGATIVE Final    Comment: (NOTE) SARS-CoV-2 target nucleic acids are NOT DETECTED.  The SARS-CoV-2 RNA is generally detectable in upper respiratoy specimens during the acute phase of infection. The lowest concentration of SARS-CoV-2 viral copies this assay can detect is 131 copies/mL. A negative result does not preclude SARS-Cov-2 infection and should not be used as the sole basis for treatment or other patient management decisions. A negative result may occur with   improper specimen collection/handling, submission of specimen other than nasopharyngeal swab, presence of viral mutation(s) within the areas targeted by this assay, and inadequate number of viral copies (<131 copies/mL). A negative result must be combined with clinical observations, patient history, and epidemiological information. The expected result is Negative.  Fact Sheet for Patients:  PinkCheek.be  Fact Sheet for Healthcare Providers:  GravelBags.it  This test is no t yet approved or cleared by the [place] FDA and  has been authorized for detection and/or diagnosis of SARS-CoV-2 by FDA under an Emergency Use Authorization (EUA). This EUA will remain  in effect (meaning this test can be used) for the duration of the COVID-19 declaration under Section 564(b)(1) of the Act, 21 U.S.C. section 360bbb-3(b)(1), unless the authorization is terminated or revoked sooner.     Influenza A by PCR NEGATIVE NEGATIVE Final   Influenza B by PCR NEGATIVE NEGATIVE Final    Comment: (NOTE) The Xpert Xpress SARS-CoV-2/FLU/RSV assay is intended as an aid in  the diagnosis of influenza from Nasopharyngeal swab specimens and  should not be used as a sole basis for treatment. Nasal washings and  aspirates are unacceptable for Xpert Xpress SARS-CoV-2/FLU/RSV  testing.  Fact Sheet for Patients: PinkCheek.be  Fact Sheet for Healthcare Providers: GravelBags.it  This test is not yet approved or cleared by the [place] FDA and  has been authorized for detection and/or diagnosis of SARS-CoV-2 by  FDA under an Emergency Use Authorization (EUA). This EUA will remain  in effect (meaning this test can be used) for the duration of the  Covid-19 declaration under Section 564(b)(1) of the Act, 21  U.S.C. section 360bbb-3(b)(1), unless the authorization is  terminated or  revoked. Performed at [facility], [place] [address] [place], [place] [address]   Culture, blood (routine x 2)     Status: None (Preliminary result)   Collection Time: 09/11/20 12:30 AM   Specimen: BLOOD  Result Value Ref Range Status   Specimen Description   Final    BLOOD RIGHT WRIST Performed at [facility] [address] [place], [place] [address]    Special Requests   Final    BOTTLES DRAWN AEROBIC ONLY Blood Culture adequate volume Performed at [facility] [address] [place], [place] [address]    Culture   Final    NO GROWTH < 12 HOURS Performed at [facility] [address], [place], [place] [address]    Report Status PENDING  Incomplete  Culture, blood (routine x 2)     Status: None (Preliminary result)   Collection Time: 09/11/20 12:30 AM   Specimen: BLOOD  Result Value Ref Range Status   Specimen Description   Final    BLOOD RIGHT ANTECUBITAL Performed at [facility] [address] [place], [place]  [address]    Special Requests   Final    BOTTLES DRAWN AEROBIC AND ANAEROBIC Blood Culture adequate volume Performed at [facility], [address] [address] [place], [place] [address]    Culture   Final    NO GROWTH < 12 HOURS Performed at [facility], [address] [place], [place] [address]    Report Status PENDING  Incomplete    RN Pressure Injury Documentation:     Estimated body mass index is 22.98 kg/m as calculated from the following:   Height as of this encounter: 5' 9" (1.753 m).   Weight as of this encounter: 70.6 kg.  Malnutrition Type:   Malnutrition Characteristics:    Nutrition Interventions:   Radiology Studies: No results found. Scheduled Meds: . mouth rinse  15 mL Mouth Rinse BID  . sodium chloride flush  3 mL Intravenous Q12H   Continuous Infusions: . HYDROmorphone      LOS: 2 days    Latif , DO [facility] is on AMION  If 7PM-7AM, please contact night-coverage [email or web address]   

## 2020-09-12 NOTE — Progress Notes (Signed)
Daily Progress Note   John Mason Name: John Mason       Date: 09/12/2020 DOB: Apr 05, 1929  Age: 86 y.o. MRN#: [id number] Attending Physician: Merlene Laughter, DO Primary Care Physician: Sharon Seller, NP Admit Date: 09/16/2020  Reason for Consultation/Follow-up: Establishing goals of care  Subjective: I saw and examined John Mason today.  He was lying in bed in no distress at time of my encounter.  Review of MAR reveals that he has had 8 doses of 0.5 mg of Dilaudid since yesterday.  Family at bedside including John Mason and John Mason.  We discussed clinical course overnight and the fact that he continues to progress in actively dying.  At this point, he does not appear to me to be stable enough to consider discharge from the hospital.  Family is aware of anticipated hospital death.  With his increased need for as needed medication, I also recommended consideration for starting continuous infusion of Dilaudid.  Family was in agreement as he has been getting agitated at times between doses of as needed medication.  Anticipatory guidance provided on progression and dying process.  Family understands short prognosis and is beginning to work to determine arrangements for the time with John Mason passes.  Length of Stay: 2  Current Medications: Scheduled Meds:  . mouth rinse  15 mL Mouth Rinse BID  . sodium chloride flush  3 mL Intravenous Q12H    Continuous Infusions: . HYDROmorphone      PRN Meds: acetaminophen **OR** acetaminophen, antiseptic oral rinse, glycopyrrolate **OR** glycopyrrolate **OR** glycopyrrolate, haloperidol **OR** haloperidol **OR** haloperidol lactate, HYDROmorphone, HYDROmorphone (DILAUDID) injection, LORazepam **OR** LORazepam **OR** LORazepam,  ondansetron **OR** ondansetron (ZOFRAN) IV, polyvinyl alcohol  Physical Exam         General: Resting on exam, periods of apnea noted HEENT: No bruits, no goiter, no JVD, oral mucosa dry Heart: Regular rate and rhythm. No murmur appreciated. Lungs: Decreased air movement, Scattered coarse Abdomen: Soft, nontender, nondistended, positive bowel sounds.  Ext: + edema Skin: Warm and dry  Vital Signs: BP (!) 181/67 (BP Location: Right Arm)   Pulse 81   Temp 98.4 F (36.9 C) (Oral)   Resp 20   Ht 5\' 9"  (1.753 m)   Wt 70.6 kg   SpO2 97%   BMI 22.98 kg/m  SpO2: SpO2: 97 %  O2 Device: O2 Device: Nasal Cannula O2 Flow Rate: O2 Flow Rate (L/min): 4 L/min  Intake/output summary:   Intake/Output Summary (Last 24 hours) at 09/12/2020 1214 Last data filed at 09/12/2020 0725 Gross per 24 hour  Intake 0 ml  Output 1000 ml  Net -1000 ml   LBM: Last BM Date: 09/10/20 Baseline Weight: Weight: 70.6 kg Most recent weight: Weight: 70.6 kg       Palliative Assessment/Data:    Flowsheet Rows     Most Recent Value  Intake Tab  Referral Department Hospitalist  Unit at Time of Referral ER  Palliative Care Primary Diagnosis Sepsis/Infectious Disease  Date Notified 2020-09-28  Palliative Care Type New Palliative care  Reason for referral Clarify Goals of Care  Date of Admission 09-28-2020  Date first seen by Palliative Care 09/10/20  # of days Palliative referral response time 1 Day(s)  # of days IP prior to Palliative referral 0  Clinical Assessment  Palliative Performance Scale Score 10%  Psychosocial & Spiritual Assessment  Palliative Care Outcomes  John Mason/Family meeting held? Yes  Who was at the meeting? John Mason via phone      John Mason Active Problem List   Diagnosis Date Noted  . Abnormal CT of the abdomen   . Dehydration   . Acute encephalopathy 28-Sep-2020  . BPH (benign prostatic hyperplasia) Sep 28, 2020  . Paroxysmal atrial fibrillation (HCC) 09-28-20  . Chronic  combined systolic and diastolic CHF (congestive heart failure) (HCC) September 28, 2020  . Hyperkalemia September 28, 2020  . Proctitis Sep 28, 2020  . Cough 08/26/2016  . Edema 08/26/2016  . Glaucoma 04/15/2016  . Anemia 04/15/2016  . Memory loss 04/15/2016  . HLD (hyperlipidemia) 04/15/2016  . Actinic keratosis 12/17/2015  . Syncope 05/22/2014  . CAD (coronary artery disease) 05/22/2014  . Bradycardia 05/22/2014  . AKI (acute kidney injury) (HCC) 05/22/2014  . Essential hypertension 10/31/2013  . Urgency incontinence 10/31/2013  . DM type 2 (diabetes mellitus, type 2) (HCC) 03/29/2013  . Unspecified vitamin D deficiency   . Benign essential tremor     Palliative Care Assessment & Plan   John Mason Profile: 86 y.o. male  with past medical history of CAD s/p PCI, chronic diastolic CHF with EF 40%, paroxysmal A. fib not on anticoagulation due to GI bleed, severe aortic regurg, hypertension, hyperlipidemia, BPH admitted on September 28, 2020 with altered mental status and weakness.  Work-up revealed likely proctitis and he was started on empiric antibiotics as he also has reported history of diarrhea.  GI has been consulted as well.  He also has hyperkalemia as well as AKI.  In the ED, he remains somnolent other than periods of agitation.  Palliative consulted for goals of care.  Recommendations/Plan:  DNR/DNI  John Mason is actively dying.  Prognosis at this point is likely hours to days.  He does not appear to be stable enough to transition from the hospital.  Anticipate hospital death.  Pain/SOB: Has required 8 doses of as needed medication overnight.  We will therefore begin Dilaudid continuous infusion at 0.5 mg/h with an additional 0.5 mg every 1 hour as needed.  Would have low threshold to titrate medication upward if needed to ensure comfort.  Anxiety: Ativan as needed  Agitaiton/nausea: Haldol as needed  Excess secretions: Robinul as needed  Visitation per end of life policy.  Goals of Care and  Additional Recommendations:  Limitations on Scope of Treatment: Full Comfort Care  Code Status:    Code Status Orders  (From admission, onward)  Start     Ordered   09/11/20 1119  Do not attempt resuscitation (DNR)  Continuous       Question Answer Comment  In the event of cardiac or respiratory ARREST Do not call a "code blue"   In the event of cardiac or respiratory ARREST Do not perform Intubation, CPR, defibrillation or ACLS   In the event of cardiac or respiratory ARREST Use medication by any route, position, wound care, and other measures to relive pain and suffering. May use oxygen, suction and manual treatment of airway obstruction as needed for comfort.      09/11/20 1120        Code Status History    Date Active Date Inactive Code Status Order ID Comments User Context   09/22/2020 2348 09/11/2020 1120 DNR [id number]  Charlsie Quest, MD ED   05/22/2014 1638 05/24/2014 1739 Full Code [id number]  Clydia Llano, MD ED   05/07/2014 0608 05/11/2014 1714 Full Code [id number]  Yates Decamp, MD Inpatient   05/07/2014 0602 05/07/2014 0608 Full Code [id number]  Yates Decamp, MD Inpatient   Advance Care Planning Activity    Advance Directive Documentation     Most Recent Value  Type of Advance Directive Out of facility DNR (pink MOST or yellow form)  Pre-existing out of facility DNR order (yellow form or pink MOST form) Physician notified to receive inpatient order  "MOST" Form in Place? --       Prognosis:   Hours - Days  Discharge Planning:  Anticipated Hospital Death  Care plan was discussed with daughter, RN, Dr. Marland Mcalpine  Thank you for allowing the Palliative Medicine Team to assist in the care of this John Mason.   Time In: 1100 Time Out: 1145 Total Time 45 Prolonged Time Billed No   Greater than 50%  of this time was spent counseling and coordinating care related to the above assessment and plan.  Romie Minus, MD  Please contact Palliative Medicine Team phone at  [phone] for questions and concerns.

## 2020-09-12 NOTE — Plan of Care (Signed)
Pt remains unresponsive.  Family at bedside.  No requests at this time.   Problem: Education: Goal: Knowledge of General Education information will improve Description: Including pain rating scale, medication(s)/side effects and non-pharmacologic comfort measures Outcome: Not Progressing   Problem: Health Behavior/Discharge Planning: Goal: Ability to manage health-related needs will improve Outcome: Not Progressing   Problem: Clinical Measurements: Goal: Ability to maintain clinical measurements within normal limits will improve Outcome: Not Progressing Goal: Will remain free from infection Outcome: Not Progressing Goal: Diagnostic test results will improve Outcome: Not Progressing Goal: Respiratory complications will improve Outcome: Not Progressing Goal: Cardiovascular complication will be avoided Outcome: Not Progressing   Problem: Activity: Goal: Risk for activity intolerance will decrease Outcome: Not Progressing   Problem: Nutrition: Goal: Adequate nutrition will be maintained Outcome: Not Progressing   Problem: Elimination: Goal: Will not experience complications related to bowel motility Outcome: Not Progressing   Problem: Pain Managment: Goal: General experience of comfort will improve Outcome: Not Progressing   Problem: Skin Integrity: Goal: Risk for impaired skin integrity will decrease Outcome: Not Progressing

## 2020-09-13 DIAGNOSIS — Z7189 Other specified counseling: Secondary | ICD-10-CM | POA: Diagnosis not present

## 2020-09-13 DIAGNOSIS — R935 Abnormal findings on diagnostic imaging of other abdominal regions, including retroperitoneum: Secondary | ICD-10-CM | POA: Diagnosis not present

## 2020-09-13 DIAGNOSIS — K6289 Other specified diseases of anus and rectum: Secondary | ICD-10-CM | POA: Diagnosis not present

## 2020-09-13 DIAGNOSIS — N179 Acute kidney failure, unspecified: Secondary | ICD-10-CM | POA: Diagnosis not present

## 2020-09-13 DIAGNOSIS — G9340 Encephalopathy, unspecified: Secondary | ICD-10-CM | POA: Diagnosis not present

## 2020-09-13 DIAGNOSIS — N400 Enlarged prostate without lower urinary tract symptoms: Secondary | ICD-10-CM | POA: Diagnosis not present

## 2020-09-13 DIAGNOSIS — R7881 Bacteremia: Secondary | ICD-10-CM | POA: Diagnosis not present

## 2020-09-13 DIAGNOSIS — I2510 Atherosclerotic heart disease of native coronary artery without angina pectoris: Secondary | ICD-10-CM | POA: Diagnosis not present

## 2020-09-16 LAB — CULTURE, BLOOD (ROUTINE X 2)
Culture: NO GROWTH
Culture: NO GROWTH
Special Requests: ADEQUATE
Special Requests: ADEQUATE

## 2020-09-18 IMAGING — DX DG CHEST 2V
2 series · 2 of 2 positions shown · non-contrast
Comparison: 05/07/2014

CLINICAL DATA: Cough and shortness of breath

EXAM:
CHEST - 2 VIEW

[dg chest 2 view (1 of 2)]
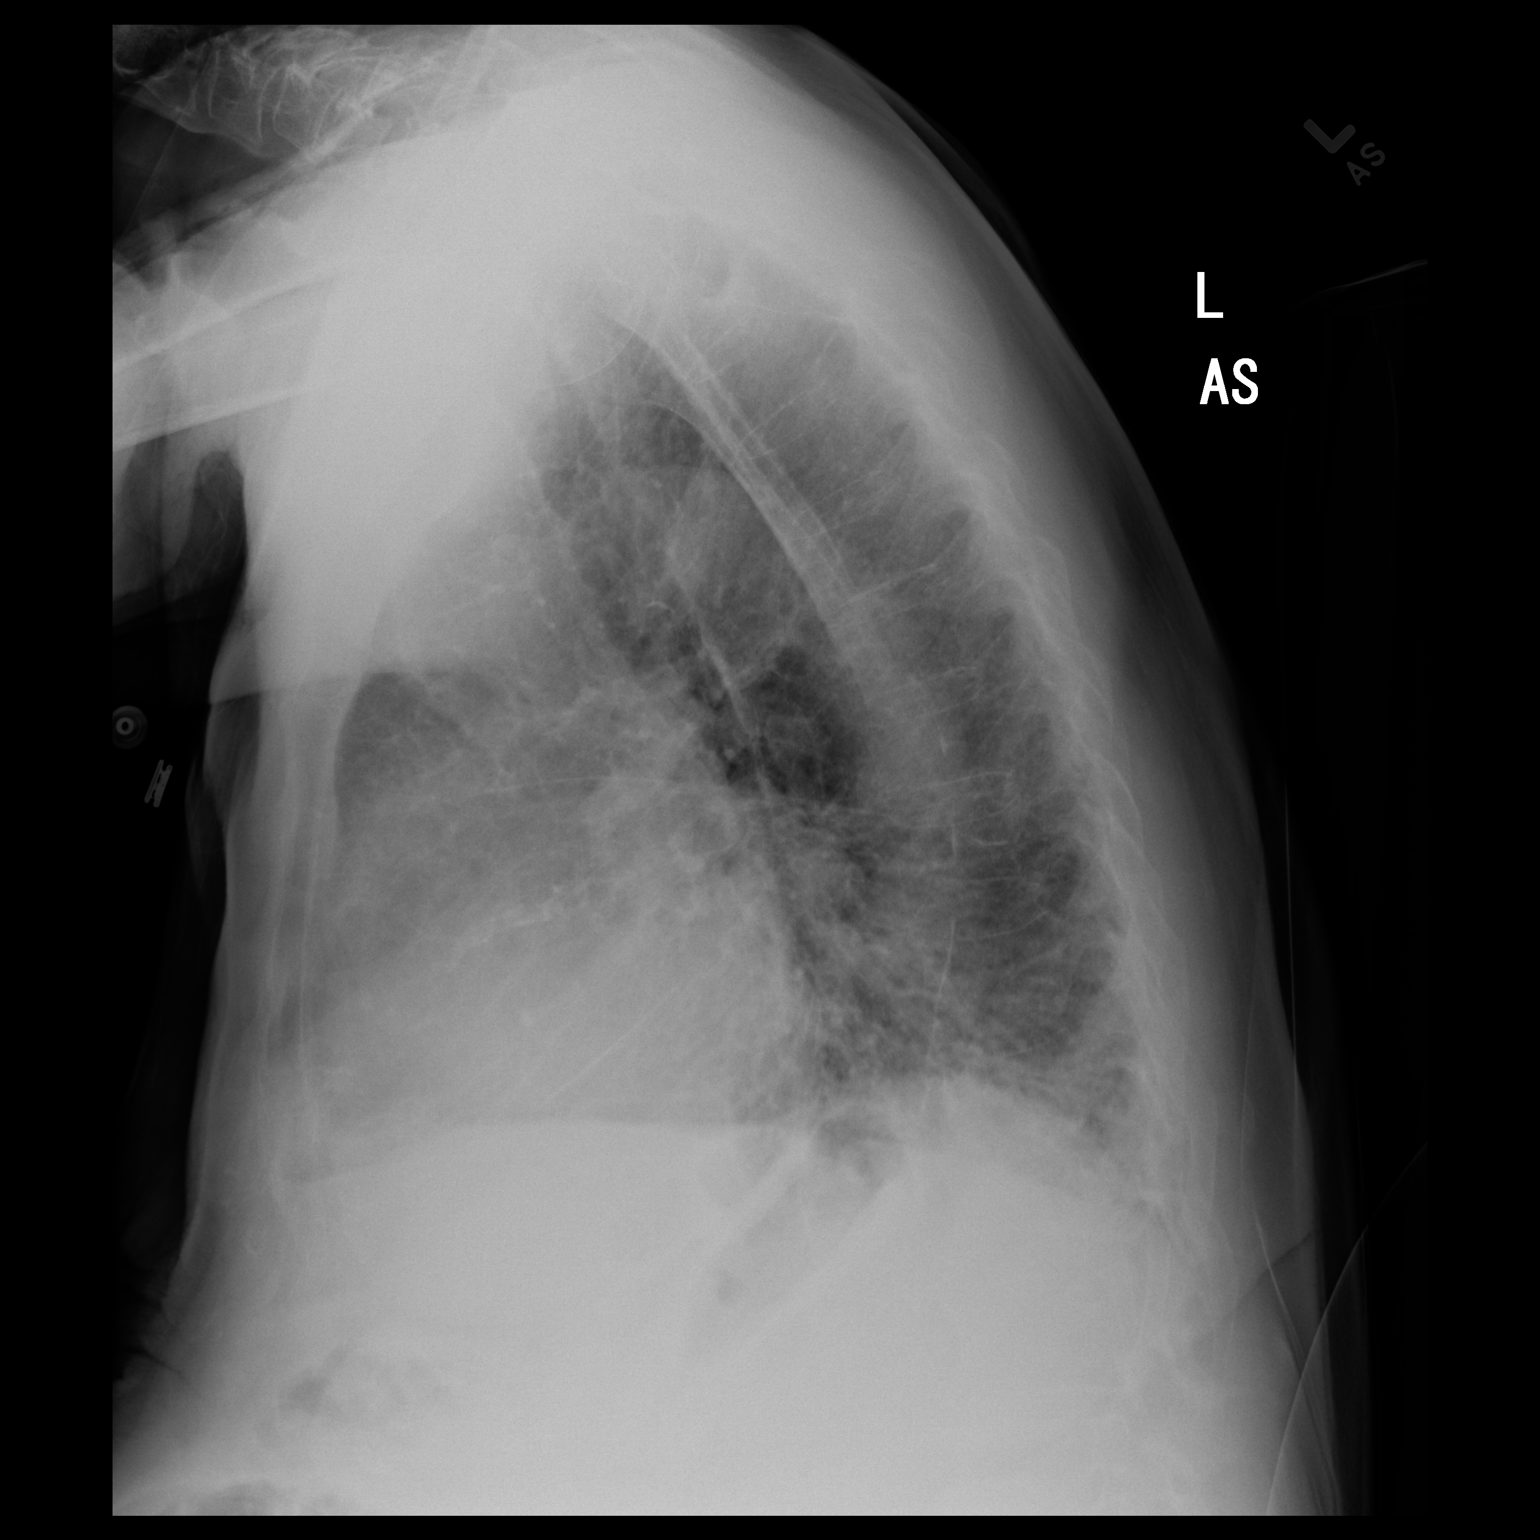

[dg chest 2 view (2 of 2)]
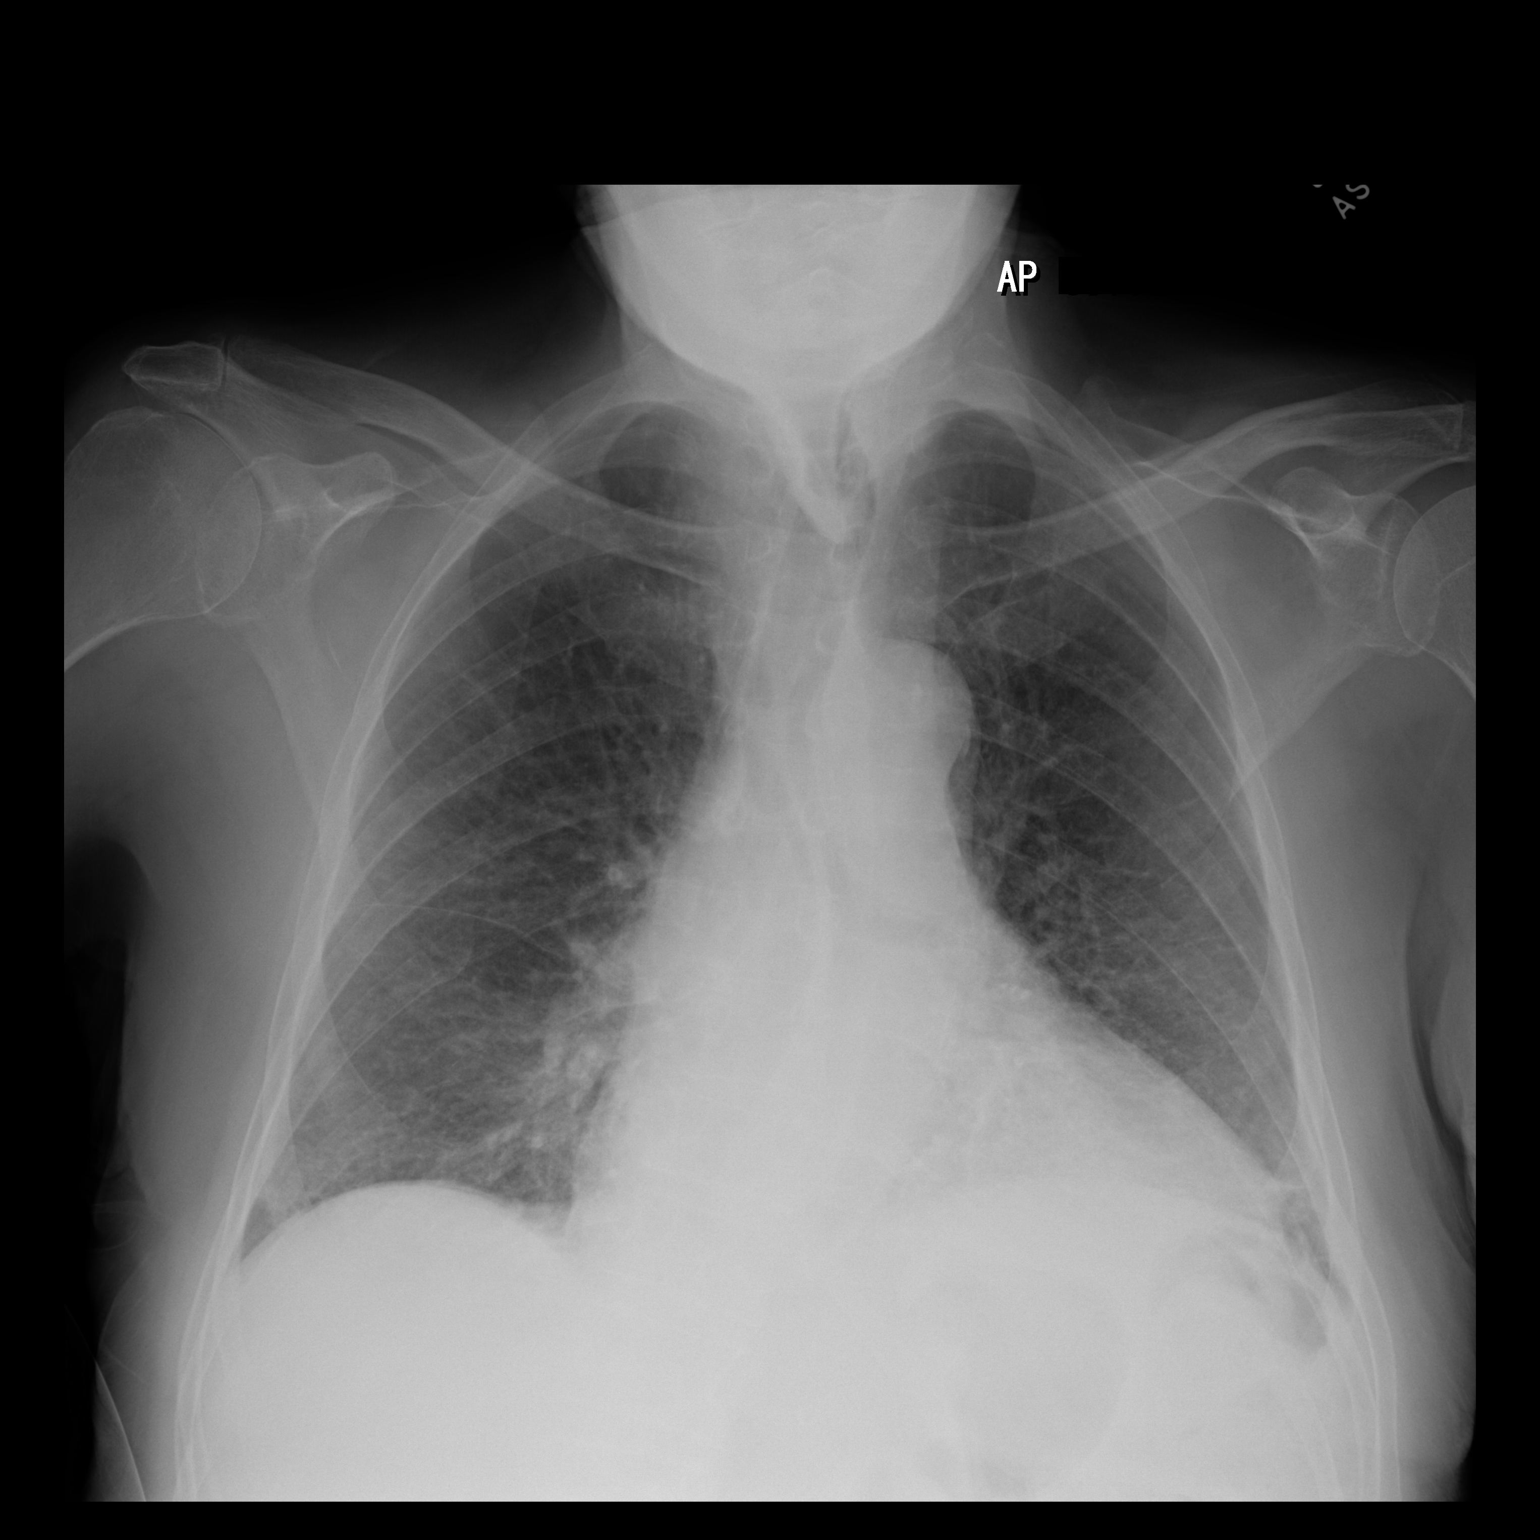

[2 of 2 positions shown; findings below may reference images not displayed]

FINDINGS: Cardiac shadow remains enlarged. Aortic calcifications are seen. The
lungs are well aerated bilaterally. Minimal left basilar atelectasis
is seen. No bony abnormality is noted.
IMPRESSION: Minimal left basilar atelectasis.

## 2020-09-23 NOTE — Progress Notes (Signed)
PROGRESS NOTE    John Mason  MRN:[id number] DOB: 11/11/1929 DOA: 08/24/2020 PCP: Eubanks, Jessica K, NP   Brief Narrative:  HPI per Dr. Vishal Patel on 09/08/2020 John Mason is a 86 y.o. male with medical history significant for CAD s/p PCI, chronic diastolic CHF (EF 40%, G2 DD on TTE 04/24/2020), paroxysmal atrial fibrillation not on anticoagulation due to GI bleeding, severe aortic regurgitation, HTN, HLD, BPH who presents to the ED for evaluation of progressive altered mental status and generalized weakness.  Patient is unable to provide history due to somnolence therefore majority of history is obtained from daughter at bedside, EDP, and chart review.  Daughter states that over the last several months patient has had some memory deficits however has been otherwise functioning well.  She says he has chronic loose stools however about a week ago he had significantly more frequent diarrhea.  Over the last 2 days he has had a significant change in which he has been very fatigued.  He has been staying in bed mostly, only occasionally getting up into a chair.  He has not been eating and therefore has not had any further bowel movements last 2 days.  He has been much less interactive and not communicating as he normally has.  He has been generally weak with a fall at home.  He did not have any significant injury.   He has not been communicating his needs and only stated that he hurt all over.  Daughter states that he was recently given home oxygen to use at night, however patient says he has not wanted to use it.  Daughter says that he was very agitated on arrival to the ED however is now resting peacefully after receiving medications for anxiety/agitation.  ED Course:  Initial vitals showed BP 141/97, pulse 96, RR 20, temp 97.9 Fahrenheit, SPO2 98% on room air.  Labs show WBC 10.5, hemoglobin 12.9, platelets 175,000, sodium 143, potassium 5.3, bicarb 18, BUN 52, creatinine 1.75, serum  glucose 165, AST 51, ALT 33, alk phos 79, total bilirubin 2.3, CK 794 high-sensitivity troponin I 57 > 49, lactic acid 3.4.  Urinalysis showed negative nitrites, negative leukocytes, >50 RBC/hpf, 0-5 WBC/hpf, rare bacteria microscopy.  VBG showed pH 7.337, PCO2 31.2, PO2 86.1.  Portable chest x-ray showed mild bibasilar atelectasis, chronic appearing interstitial lung markings, and cardiomegaly.  CT head without contrast showed atrophy and chronic microvascular disease without acute intracranial abnormality.  CT cervical spine without contrast showed diffuse degenerative disc and facet disease without acute bony abnormality.  CT chest/abdomen/pelvis without contrast showed irregular wall thickening at the junction of the sigmoid colon and rectum over 7 cm segment indeterminate for tumor versus proctitis.  Cardiomegaly with small/trace bilateral pleural effusions noted.  Bilateral pulmonary reticular and groundglass opacities also seen felt to reflect interstitial edema versus atelectasis superimposed on degree of chronic lung disease.  No other acute or inflammatory process identified in the chest/abdomen/pelvis.  Patient was given IV ceftriaxone and Flagyl.  Patient was agitated in the ED and received 1 mg IV Ativan and 5 mg IM Haldol.  He also received 1 L normal saline.  The hospitalist service was consulted to admit for further evaluation and management.  **Interim History Further work-up revealed that patient had a Pseudomonas Luteola bacteremia.  Yesterday (09/11/20) His labs were worsened somewhat with minimal improvement in his renal function.  After further goals of care discussion the patient and daughter elected to transition the patient to full comfort measures   and if patient survives the night and is stable for the morning can be discharged to residential hospice however we expect in-hospital death given his poor prognosis and overall deterioration.  Patient appears uncomfortable  on examination and comfort measures have been enacted and he has been placed on Dilaudid, Ativan, Haldol, as well as glycopyrrolate for comfort measures and all other measures have been discontinued.  Yesterday The patient was resting but started having short and shallow breathing and after discussion with palliative care we will change him to a Dilaudid drip for further comfort.  Anticipate in-hospital death as prognosis extremely poor and guarded and he is a high risk for further decompensation.  Today the patient remains unresponsive to physical and verbal stimuli but is appearing comfortable and resting.  Continues to have a short shallow breathing and extremities are starting to become cool.  He is not safe to transfer to residential hospice at this time and possible death is expected and likely imminent.  Assessment & Plan:   Principal Problem:   Acute encephalopathy Active Problems:   Essential hypertension   CAD (coronary artery disease)   AKI (acute kidney injury) ([place])   HLD (hyperlipidemia)   BPH (benign prostatic hyperplasia)   Paroxysmal atrial fibrillation (HCC)   Chronic combined systolic and diastolic CHF (congestive heart failure) (HCC)   Hyperkalemia   Proctitis   Abnormal CT of the abdomen   Dehydration  Abnormal CT abdomen and pelvis/concern for proctitis versus tumor -Patient had presented with altered mental status, acute kidney injury and concern for proctitis.   -CT abdomen and pelvis which was done showed changes concerning for proctitis versus tumor at the junction of the sigmoid colon and rectum with a segment of 7 cm.   -Lactic acid noted to be elevated.  Patient placed on IV fluids for hydration and Lactic acid levels improved down to 1.5.  Leukocytosis has gone from 10.5 -> 12.1 and worsened to 12.9.  -Continued IV Rocephin and IV Flagyl but escalated to IV Cefepime given Pseudomonas Luteola Bacteremia.   -If patient developed loose stools would have checked  a C. difficile PCR and GI pathogen panel. Per admitting MD patient with no bowel movement in the past 2 days.   -Consult with GI for further evaluation and management felt that the patient had a complex clinical picture but essentially it appeared to be either inflammatory rectosigmoid process or more likely a malignancy and they recommended entirely with a conservative plan on this poor debilitated patient and recommended palliative measures. -Palliative consulted for further evaluation and goals of care discussion and after further discussion with the patient's daughter she is elected to transition the patient to comfort measures given that he he appeared to be actively dying.  All medications not in the patient's comfort have been discontinued and he has been given Dilaudid as needed for pain or shortness breath, Ativan for anxiety, Haldol for agitation nausea as well as Robinul for excessive secretions.  Patient does not appear stable enough to consider transport to hospice and anticipating in hospital death given his extremely poor prognosis; he is unresponsive to physical verbal stimuli and resting comfortably; continues to have short shallow breathing now and extremities are started become cool  Pseudomonas Luteola Bacteremia -Noted on blood cultures from 08/26/2020 -As above; antibiotics were escalated IV cefepime however patient has been transitioned to comfort measures only now and all medications not in the patient's comfort has been discontinued and he will be started on a Dilaudid drip  with as needed Dilaudid IV as needed  Acute Metabolic Encephalopathy -In the setting of underlying cognitive impairment at baseline.   -Patient noted to have a worsening confusion over the past 2 days likely secondary to concern for proctitis, severe dehydration, acute kidney injury.   -Continued hydration with IV fluids but had stopped IV sodium chloride and started D5W given his hyperkalemia and  hyperchloremia -Empiric IV antibiotics were changed but now has been discontinued -Continue with supportive care given the patient has a very high risk for decompensation and likely has a in-hospital death  Acute Kidney Injury -Secondary to a prerenal azotemia secondary to hypovolemia/dehydration in the setting of GI losses and ARB and spironolactone.   -ARB and spironolactone on hold and will continue to Hold.   -Urinalysis nitrite negative, leukocytes negative, 100 protein.  Urine sodium 140.  Urine creatinine 143.87.   -CT abdomen and pelvis with no noted hydronephrosis.   -Renal function with minimal improvement from admission as BUN/Cr went from 52/1.75 -> 55/1.71 -> 62/1.62 -Increased IV fluids to 100 cc an hour but changed to D5W at 75 mL/hr given Hypernatremia, Hyperchloremia, and Hyperkalemia but this is now stopped as he is not now full comfort measures -Patient now full comfort measures and on Dilaudid drip and will not  Hyperkalemia -In the setting of acute kidney injury and ARB and spironolactone use.   -ARB and spironolactone on hold.   -Improved with hydration but worsened back to 5.4 -Changed IVF to D5W and gave a dose of Lokelma but will not check anymore as patient is being transitioned to FULL Comfort Measures and will stop IVF and not recheck labs  Hyperphosphatemia -Patient's phosphorus level from 6.5 and slightly improved to 5.3  -We will not recheck given patient is being transitioned to comfort measures  Hypermagnesemia -Patient magnesium levels 2.7 -We will not recheck as patient is being transitioned to comfort measures  Hypernatremia/Hyperchloremia -Patient sodium went from 143 is now 148 and chloride level went from 107 and is now 113 -Setting of normal saline resuscitation -Changed IV fluids from normal saline to D5W and change the rate to 75 MLS per hour however because of the shift of focus to comfort measures IV fluids will now be discontinued and will  not check CMP again in the morning if the patient survives  Chronic Combined Systolic and Diastolic CHF Coronary artery disease status post PCI -Last 2D echo with a EF of 56%, grade 2 diastolic dysfunction.   -Patient volume depleted, dehydrated.   -Continued hydration with IV fluids but changed to above.  -Continue to hold spironolactone and ARB.  Monitor volume status closely with hydration.   -Continue statin.  Not on aspirin due to recent GI bleed.  -As Above patient is being transitioned to FULL Comfort Measures  Paroxysmal atrial fibrillation -Recently diagnosed within the last 6 months.   -Patient currently somnolent and likely unable to take any oral intake at this time.   -Placed on IV Lopressor 2.5 mg every 6 hours for rate control.   -Not on anticoagulation aspirin due to recent GI bleed.  -Patient is being transitioned to Comfort Measures   Hypertension -Placed on IV metoprolol but this will be discontinued as he is being transitioned to FULL Comfort Measures    BPH -Proscar one patient more alert and tolerating oral intake.  Dehydration -IV fluids now discontinued   Generalized Weakness/Deconditioning Goals of Care -Patient noted to have significant deconditioning, failure to thrive prior to admission.   -  Admitting physician discussed with patient's daughter on admission in terms of patient's deterioration.  -Patient currently DNR/DNI.   -Patient was monitored over the next 24 to 48 hours and since he had no significant clinical improvement Daughter decided to shift focus to comfort measures.   -Palliative care consulted and appreciate GOC discussion and he is now on a Dilaudid drip and had end-of-life/terminal care   DVT prophylaxis: None; Patient is now Comfort Care Code Status: DO NOT RESUSCITATE Family Communication: Discussed with family at bedside Disposition Plan: Residential hospice this patient is stable enough to be transported tomorrow but likely  anticipate in-hospital death given his poor prognosis and current deterioration  Status is: Inpatient  Remains inpatient appropriate because:Ongoing diagnostic testing needed not appropriate for outpatient work up, Unsafe d/c plan, IV treatments appropriate due to intensity of illness or inability to take PO and Inpatient level of care appropriate due to severity of illness   Dispo: The patient is from: Home              Anticipated d/c is to: Inpatient Hospital Death              Anticipated d/c date is: 1-2 days              Patient currently is not medically stable to d/c.  Consultants:   Gastroenterology  Palliative Care Medicine    Procedures: None  Antimicrobials:  Anti-infectives (From admission, onward)   Start     Dose/Rate Route Frequency Ordered Stop   09/11/20 1115  ceFEPIme (MAXIPIME) 2 g in sodium chloride 0.9 % 100 mL IVPB  Status:  Discontinued        2 g 200 mL/hr over 30 Minutes Intravenous Every 12 hours 09/11/20 1017 09/11/20 1118   09/10/20 2200  cefTRIAXone (ROCEPHIN) 2 g in sodium chloride 0.9 % 100 mL IVPB  Status:  Discontinued        2 g 200 mL/hr over 30 Minutes Intravenous Every 24 hours 09/21/2020 2349 09/11/20 1016   09/10/20 0800  metroNIDAZOLE (FLAGYL) IVPB 500 mg  Status:  Discontinued        500 mg 100 mL/hr over 60 Minutes Intravenous Every 8 hours 08/31/2020 2349 09/11/20 1118   09/08/2020 2215  cefTRIAXone (ROCEPHIN) 2 g in sodium chloride 0.9 % 100 mL IVPB        2 g 200 mL/hr over 30 Minutes Intravenous  Once 09/18/2020 2207 08/28/2020 2306   09/08/2020 2215  metroNIDAZOLE (FLAGYL) IVPB 500 mg        500 mg 100 mL/hr over 60 Minutes Intravenous  Once 09/22/2020 2207 09/10/20 0028        Subjective: Seen and examined and again patient appears to be resting comfortably and still is having short shallow breathing.  Extremities remain cool.  He is essentially unresponsive to physical verbal stimuli.  Family at bedside and answered all her questions to  their satisfaction.  No other concerns or complaints at this time.  Objective: Vitals:   09/11/20 0543 09/12/20 1428 09/12/20 2241 09-26-20 0547  BP: (!) 181/67 (!) 174/77 (!) 169/76 (!) 155/56  Pulse: 81 73 74 (!) 54  Resp: _0 Temp: 98.4 F (36.9 C) 98.3 F (36.8 C) (!) 97.5 F (36.4 C) 98.1 F (36.7 C)  TempSrc: Oral Oral Oral Oral  SpO2: 97% (!) 88% (!) 86% 94%  Weight:      Height:        Intake/Output Summary (Last 24  hours) at 19-Sep-2020 1126 Last data filed at 19-Sep-2020 0553 Gross per 24 hour  Intake 15.75 ml  Output 750 ml  Net -734.25 ml   Filed Weights   09/11/20 0500  Weight: 70.6 kg   Examination: Physical Exam:  Constitutional: Patient is an elderly Caucasian male resting comfortably and unresponsive to physical and verbal stimuli.  Continues to have short shallow breathing  Respiratory: Diminished to auscultation bilaterally with slightly coarse breath sounds.  He is slightly tachypneic and has short shallow breathing and is wearing supplemental oxygen via nasal cannula. Cardiovascular: Irregularly irregular, no murmurs / rubs / gallops. S1 and S2 auscultated.  Has 1+ lower extremity edema Abdomen: Soft, non-tender, non-distended. Bowel sounds positive.  GU: Deferred. Skin: No rashes, lesions, ulcers on limited skin evaluation. No induration; skin is cool and dry.  Neurologic: Unresponsive to physical and verbal stimuli.  Data Reviewed: I have personally reviewed following labs and imaging studies  CBC: Recent Labs  Lab 09/12/2020 1939 09/10/20 0500 09/11/20 0030  WBC 10.5 12.1* 12.9*  NEUTROABS  --   --  11.1*  HGB 12.9* 12.8* 12.4*  HCT 42.8 43.5 41.0  MCV 85.3 86.8 86.1  PLT 175 167 010   Basic Metabolic Panel: Recent Labs  Lab 09/02/2020 1939 09/10/20 0500 09/11/20 0030  NA 143 143 148*  K 5.3* 5.0 5.4*  CL 107 110 113*  CO2 18* 19* 17*  GLUCOSE 165* 158* 156*  BUN 52* 55* 62*  CREATININE 1.75* 1.71* 1.62*  CALCIUM 9.4 8.9  9.2  MG  --  2.7* 2.7*  PHOS  --  6.5* 5.3*   GFR: Estimated Creatinine Clearance: 30.9 mL/min (A) (by C-G formula based on SCr of 1.62 mg/dL (H)). Liver Function Tests: Recent Labs  Lab 09/15/2020 1939 09/11/20 0030  AST 51*  --   ALT 33  --   ALKPHOS 79  --   BILITOT 2.3*  --   PROT 6.9  --   ALBUMIN 3.9 3.5   No results for input(s): LIPASE, AMYLASE in the last 168 hours. No results for input(s): AMMONIA in the last 168 hours. Coagulation Profile: No results for input(s): INR, PROTIME in the last 168 hours. Cardiac Enzymes: Recent Labs  Lab 08/24/2020 1939  CKTOTAL 794*   BNP (last 3 results) No results for input(s): PROBNP in the last 8760 hours. HbA1C: No results for input(s): HGBA1C in the last 72 hours. CBG: Recent Labs  Lab 08/24/2020 1919  GLUCAP 158*   Lipid Profile: No results for input(s): CHOL, HDL, LDLCALC, TRIG, CHOLHDL, LDLDIRECT in the last 72 hours. Thyroid Function Tests: No results for input(s): TSH, T4TOTAL, FREET4, T3FREE, THYROIDAB in the last 72 hours. Anemia Panel: No results for input(s): VITAMINB12, FOLATE, FERRITIN, TIBC, IRON, RETICCTPCT in the last 72 hours. Sepsis Labs: Recent Labs  Lab 09/02/2020 2055 09/03/2020 2346  LATICACIDVEN 3.4* 1.5    Recent Results (from the past 240 hour(s))  Urine culture     Status: Abnormal   Collection Time: 09/17/2020  7:05 PM   Specimen: Urine, Clean Catch  Result Value Ref Range Status   Specimen Description   Final    URINE, CLEAN CATCH Performed at [facility], [place] [address] [place], [place] [address]    Special Requests   Final    NONE Performed at [facility], [place] [address] [place], [place] [address]    Culture MULTIPLE SPECIES PRESENT, SUGGEST RECOLLECTION (A)  Final   Report Status 09/11/2020 FINAL  Final  Culture, blood (single) w Reflex to ID Panel     Status: Abnormal   Collection Time: 09/08/2020  7:20 PM   Specimen: BLOOD RIGHT FOREARM    Result Value Ref Range Status   Specimen Description   Final    BLOOD RIGHT FOREARM Performed at [facility], [place] [address] [place], [place] [address]    Special Requests   Final    BOTTLES DRAWN AEROBIC AND ANAEROBIC Blood Culture results may not be optimal due to an inadequate volume of blood received in culture bottles Performed at [place] [address] [place], [place] [address]    Culture  Setup Time   Final    AEROBIC BOTTLE ONLY GRAM NEGATIVE RODS Organism ID to follow CRITICAL RESULT CALLED TO, READ BACK BY AND VERIFIED WITHSeleta Rhymes [facility] 09/10/20 2303 JDW Performed at [facility], [address] [place], [place] [address]    Culture PSEUDOMONAS LUTEOLA (A)  Final   Report Status 09/12/2020 FINAL  Final   Organism ID, Bacteria PSEUDOMONAS LUTEOLA  Final      Susceptibility   Pseudomonas luteola - MIC*    CEFTAZIDIME <=1 SENSITIVE Sensitive     CIPROFLOXACIN <=0.25 SENSITIVE Sensitive     GENTAMICIN <=1 SENSITIVE Sensitive     IMIPENEM <=0.25 SENSITIVE Sensitive     PIP/TAZO <=4 SENSITIVE Sensitive     * PSEUDOMONAS LUTEOLA  Blood Culture ID Panel (Reflexed)     Status: None   Collection Time: 08/25/2020  7:20 PM  Result Value Ref Range Status   Enterococcus faecalis NOT DETECTED NOT DETECTED Final   Enterococcus Faecium NOT DETECTED NOT DETECTED Final   Listeria monocytogenes NOT DETECTED NOT DETECTED Final   Staphylococcus species NOT DETECTED NOT DETECTED Final   Staphylococcus aureus (BCID) NOT DETECTED NOT DETECTED Final   Staphylococcus epidermidis NOT DETECTED NOT DETECTED Final   Staphylococcus lugdunensis NOT DETECTED NOT DETECTED Final   Streptococcus species NOT DETECTED NOT DETECTED Final   Streptococcus agalactiae NOT DETECTED NOT DETECTED Final   Streptococcus pneumoniae NOT DETECTED NOT DETECTED Final   Streptococcus pyogenes NOT DETECTED NOT DETECTED Final   A.calcoaceticus-baumannii NOT DETECTED NOT  DETECTED Final   Bacteroides fragilis NOT DETECTED NOT DETECTED Final   Enterobacterales NOT DETECTED NOT DETECTED Final   Enterobacter cloacae complex NOT DETECTED NOT DETECTED Final   Escherichia coli NOT DETECTED NOT DETECTED Final   Klebsiella aerogenes NOT DETECTED NOT DETECTED Final   Klebsiella oxytoca NOT DETECTED NOT DETECTED Final   Klebsiella pneumoniae NOT DETECTED NOT DETECTED Final   Proteus species NOT DETECTED NOT DETECTED Final   Salmonella species NOT DETECTED NOT DETECTED Final   Serratia marcescens NOT DETECTED NOT DETECTED Final   Haemophilus influenzae NOT DETECTED NOT DETECTED Final   Neisseria meningitidis NOT DETECTED NOT DETECTED Final   Pseudomonas aeruginosa NOT DETECTED NOT DETECTED Final   Stenotrophomonas maltophilia NOT DETECTED NOT DETECTED Final   Candida albicans NOT DETECTED NOT DETECTED Final   Candida auris NOT DETECTED NOT DETECTED Final   Candida glabrata NOT DETECTED NOT DETECTED Final   Candida krusei NOT DETECTED NOT DETECTED Final   Candida parapsilosis NOT DETECTED NOT DETECTED Final   Candida tropicalis NOT DETECTED NOT DETECTED Final   Cryptococcus neoformans/gattii NOT DETECTED NOT DETECTED Final    Comment: Performed at [facility], [address] [place], [place] [address]  Respiratory Panel by RT PCR (Flu A&B, Covid) - Nasopharyngeal Swab  Status: None   Collection Time: 09/08/2020  8:57 PM   Specimen: Nasopharyngeal Swab  Result Value Ref Range Status   SARS Coronavirus 2 by RT PCR NEGATIVE NEGATIVE Final    Comment: (NOTE) SARS-CoV-2 target nucleic acids are NOT DETECTED.  The SARS-CoV-2 RNA is generally detectable in upper respiratoy specimens during the acute phase of infection. The lowest concentration of SARS-CoV-2 viral copies this assay can detect is 131 copies/mL. A negative result does not preclude SARS-Cov-2 infection and should not be used as the sole basis for treatment or other patient management decisions.  A negative result may occur with  improper specimen collection/handling, submission of specimen other than nasopharyngeal swab, presence of viral mutation(s) within the areas targeted by this assay, and inadequate number of viral copies (<131 copies/mL). A negative result must be combined with clinical observations, patient history, and epidemiological information. The expected result is Negative.  Fact Sheet for Patients:  PinkCheek.be  Fact Sheet for Healthcare Providers:  GravelBags.it  This test is no t yet approved or cleared by the [place] FDA and  has been authorized for detection and/or diagnosis of SARS-CoV-2 by FDA under an Emergency Use Authorization (EUA). This EUA will remain  in effect (meaning this test can be used) for the duration of the COVID-19 declaration under Section 564(b)(1) of the Act, 21 U.S.C. section 360bbb-3(b)(1), unless the authorization is terminated or revoked sooner.     Influenza A by PCR NEGATIVE NEGATIVE Final   Influenza B by PCR NEGATIVE NEGATIVE Final    Comment: (NOTE) The Xpert Xpress SARS-CoV-2/FLU/RSV assay is intended as an aid in  the diagnosis of influenza from Nasopharyngeal swab specimens and  should not be used as a sole basis for treatment. Nasal washings and  aspirates are unacceptable for Xpert Xpress SARS-CoV-2/FLU/RSV  testing.  Fact Sheet for Patients: PinkCheek.be  Fact Sheet for Healthcare Providers: GravelBags.it  This test is not yet approved or cleared by the [place] FDA and  has been authorized for detection and/or diagnosis of SARS-CoV-2 by  FDA under an Emergency Use Authorization (EUA). This EUA will remain  in effect (meaning this test can be used) for the duration of the  Covid-19 declaration under Section 564(b)(1) of the Act, 21  U.S.C. section 360bbb-3(b)(1), unless the  authorization is  terminated or revoked. Performed at [facility], [place] [address] [place], [place] [address]   Culture, blood (routine x 2)     Status: None (Preliminary result)   Collection Time: 09/11/20 12:30 AM   Specimen: BLOOD  Result Value Ref Range Status   Specimen Description   Final    BLOOD RIGHT WRIST Performed at [place] [address], [place], [place] [address]    Special Requests   Final    BOTTLES DRAWN AEROBIC ONLY Blood Culture adequate volume Performed at [place] [address], [place], [place] [address]    Culture   Final    NO GROWTH 2 DAYS Performed at [place] [address] [place], [place] [address]    Report Status PENDING  Incomplete  Culture, blood (routine x 2)     Status: None (Preliminary result)   Collection Time: 09/11/20 12:30 AM   Specimen: BLOOD  Result Value Ref Range Status   Specimen Description   Final    BLOOD RIGHT ANTECUBITAL Performed at [place] [address] [address] [place], [place] [address]    Special Requests  Final    BOTTLES DRAWN AEROBIC AND ANAEROBIC Blood Culture adequate volume Performed at [facility] [address] [place], [place] [address]    Culture   Final    NO GROWTH 2 DAYS Performed at [facility] [address], [place], [place] [address]    Report Status PENDING  Incomplete    RN Pressure Injury Documentation:     Estimated body mass index is 22.98 kg/m as calculated from the following:   Height as of this encounter: _0  (1.753 m).   Weight as of this encounter: 70.6 kg.  Malnutrition Type:   Malnutrition Characteristics:    Nutrition Interventions:   Radiology Studies: No results found. Scheduled Meds: . mouth rinse  15 mL Mouth Rinse BID  . sodium chloride flush  3 mL Intravenous Q12H   Continuous Infusions: . HYDROmorphone 0.5 mg/hr (09/15/20 0656)     LOS: 3 days   Kerney Elbe, DO [facility] PAGER is on [place]  If 7PM-7AM, please contact night-coverage [email or web address]

## 2020-09-23 NOTE — ED Provider Notes (Addendum)
[facility]-EMER[facility] DEPT [facility] Emergency Department Provider Note MRN:  [id number]  Arrival date & time: 09/16/20     Chief Complaint   Altered Mental Status and Weakness   History of Present Illness   John Mason is a 86 y.o. year-old male with a history of CAD presenting to the ED with chief complaint of altered mental status.  Worsening mental status for 1 week, can normally ambulate but now cannot.  I was unable to obtain an accurate HPI, PMH, or ROS due to the patient's altered mental status.  Level 5 caveat.  Review of Systems  Positive for altered mental status.  Patient's Health History    Past Medical History:  Diagnosis Date  . Abnormality of gait   . Actinic keratosis   . Cognitive deficits, late effect of cerebrovascular disease   . Contact dermatitis and other eczema due to other specified agent   . Dizziness and giddiness   . Elevated prostate specific antigen (PSA)   . Essential and other specified forms of tremor   . First degree atrioventricular block   . Gastric ulcer, unspecified as acute or chronic, without mention of hemorrhage, perforation, or obstruction   . Hypertrophy of prostate without urinary obstruction and other lower urinary tract symptoms (LUTS)   . Impotence of organic origin   . Insomnia, unspecified   . Obesity, unspecified   . Other abnormal blood chemistry   . Pain in joint, lower leg   . STEMI (ST elevation myocardial infarction) (HCC) 05/07/2014  . Unspecified essential hypertension   . Unspecified glaucoma(365.9)   . Unspecified vitamin D deficiency   . Urge incontinence   . Urinary frequency     Past Surgical History:  Procedure Laterality Date  . APPENDECTOMY  1930's?  Marland Kitchen CATARACT EXTRACTION W/ INTRAOCULAR LENS  IMPLANT, BILATERAL Bilateral   . CORONARY ANGIOPLASTY WITH STENT PLACEMENT  05/07/2014   "1"  . CYST EXCISION Left 1977   "knee"  . LEFT HEART CATHETERIZATION WITH CORONARY ANGIOGRAM N/A 05/07/2014    Procedure: LEFT HEART CATHETERIZATION WITH CORONARY ANGIOGRAM;  Surgeon: Pamella Pert, MD;  Location: [facility] CATH LAB;  Service: Cardiovascular;  Laterality: N/A;  . MULTIPLE TOOTH EXTRACTIONS    . TRANSURETHRAL RESECTION OF PROSTATE  1987    Family History  Problem Relation Age of Onset  . Stroke Mother   . Stroke Sister   . Stroke Brother     Social History   Socioeconomic History  . Marital status: Widowed    Spouse name: Not on file  . Number of children: 2  . Years of education: Not on file  . Highest education level: Not on file  Occupational History  . Not on file  Tobacco Use  . Smoking status: Never Smoker  . Smokeless tobacco: Current User    Types: Chew  . Tobacco comment: Smokeless Tobacco, Patient chews everyday.  Vaping Use  . Vaping Use: Never used  Substance and Sexual Activity  . Alcohol use: No  . Drug use: No  . Sexual activity: Never  Other Topics Concern  . Not on file  Social History Narrative   Son passed away.   Social Determinants of Health   Financial Resource Strain:   . Difficulty of Paying Living Expenses: Not on file  Food Insecurity:   . Worried About Programme researcher, broadcasting/film/video in the Last Year: Not on file  . Ran Out of Food in the Last Year: Not on file  Transportation Needs:   .  Lack of Transportation (Medical): Not on file  . Lack of Transportation (Non-Medical): Not on file  Physical Activity:   . Days of Exercise per Week: Not on file  . Minutes of Exercise per Session: Not on file  Stress:   . Feeling of Stress : Not on file  Social Connections:   . Frequency of Communication with Friends and Family: Not on file  . Frequency of Social Gatherings with Friends and Family: Not on file  . Attends Religious Services: Not on file  . Active Member of Clubs or Organizations: Not on file  . Attends Banker Meetings: Not on file  . Marital Status: Not on file  Intimate Partner Violence:   . Fear of Current or Ex-Partner:  Not on file  . Emotionally Abused: Not on file  . Physically Abused: Not on file  . Sexually Abused: Not on file     Physical Exam   Vitals:   09/12/20 2241 09-22-2020 0547  BP: (!) 169/76 (!) 155/56  Pulse: 74 (!) 54  Resp: 14 14  Temp: (!) 97.5 F (36.4 C) 98.1 F (36.7 C)  SpO2: (!) 86% 94%    CONSTITUTIONAL: Ill-appearing, NAD NEURO:  Alert and oriented x 3, no focal deficits EYES:  eyes equal and reactive ENT/NECK:  no LAD, no JVD CARDIO: Regular rate, well-perfused, normal S1 and S2 PULM:  CTAB no wheezing or rhonchi GI/GU:  normal bowel sounds, non-distended, non-tender MSK/SPINE:  No gross deformities, no edema SKIN:  no rash, atraumatic PSYCH:  Appropriate speech and behavior  *Additional and/or pertinent findings included in MDM below  Diagnostic and Interventional Summary    EKG Interpretation  Date/Time:  Monday September 09 2020 19:28:50 EDT Ventricular Rate:  65 PR Interval:    QRS Duration: 143 QT Interval:  442 QTC Calculation: 460 R Axis:   -62 Text Interpretation: Atrial fibrillation RBBB and LAFB when compared to prior, now afib. No STEMI Confirmed by Theda Belfast ([id number]) on 09/10/2020 1:45:03 PM      Labs Reviewed  CULTURE, BLOOD (SINGLE) - Abnormal; Notable for the following components:      Result Value   Culture PSEUDOMONAS LUTEOLA (*)    All other components within normal limits  URINE CULTURE - Abnormal; Notable for the following components:   Culture MULTIPLE SPECIES PRESENT, SUGGEST RECOLLECTION (*)    All other components within normal limits  CBC - Abnormal; Notable for the following components:   Hemoglobin 12.9 (*)    MCH 25.7 (*)    RDW 17.0 (*)    nRBC 0.3 (*)    All other components within normal limits  COMPREHENSIVE METABOLIC PANEL - Abnormal; Notable for the following components:   Potassium 5.3 (*)    CO2 18 (*)    Glucose, Bld 165 (*)    BUN 52 (*)    Creatinine, Ser 1.75 (*)    AST 51 (*)    Total Bilirubin 2.3  (*)    GFR, Estimated 34 (*)    Anion gap 18 (*)    All other components within normal limits  CK - Abnormal; Notable for the following components:   Total CK 794 (*)    All other components within normal limits  URINALYSIS, ROUTINE W REFLEX MICROSCOPIC - Abnormal; Notable for the following components:   Color, Urine AMBER (*)    APPearance HAZY (*)    Hgb urine dipstick MODERATE (*)    Ketones, ur 5 (*)    Protein,  ur 100 (*)    RBC / HPF >50 (*)    Bacteria, UA RARE (*)    All other components within normal limits  LACTIC ACID, PLASMA - Abnormal; Notable for the following components:   Lactic Acid, Venous 3.4 (*)    All other components within normal limits  BLOOD GAS, VENOUS - Abnormal; Notable for the following components:   pCO2, Ven 31.2 (*)    pO2, Ven 86.1 (*)    Bicarbonate 16.3 (*)    Acid-base deficit 8.1 (*)    All other components within normal limits  MAGNESIUM - Abnormal; Notable for the following components:   Magnesium 2.7 (*)    All other components within normal limits  PHOSPHORUS - Abnormal; Notable for the following components:   Phosphorus 6.5 (*)    All other components within normal limits  CBC - Abnormal; Notable for the following components:   WBC 12.1 (*)    Hemoglobin 12.8 (*)    MCH 25.5 (*)    MCHC 29.4 (*)    RDW 16.8 (*)    All other components within normal limits  BASIC METABOLIC PANEL - Abnormal; Notable for the following components:   CO2 19 (*)    Glucose, Bld 158 (*)    BUN 55 (*)    Creatinine, Ser 1.71 (*)    GFR, Estimated 35 (*)    All other components within normal limits  CBC WITH DIFFERENTIAL/PLATELET - Abnormal; Notable for the following components:   WBC 12.9 (*)    Hemoglobin 12.4 (*)    RDW 17.0 (*)    nRBC 0.3 (*)    Neutro Abs 11.1 (*)    All other components within normal limits  MAGNESIUM - Abnormal; Notable for the following components:   Magnesium 2.7 (*)    All other components within normal limits  RENAL  FUNCTION PANEL - Abnormal; Notable for the following components:   Sodium 148 (*)    Potassium 5.4 (*)    Chloride 113 (*)    CO2 17 (*)    Glucose, Bld 156 (*)    BUN 62 (*)    Creatinine, Ser 1.62 (*)    Phosphorus 5.3 (*)    GFR, Estimated 37 (*)    Anion gap 18 (*)    All other components within normal limits  CBG MONITORING, ED - Abnormal; Notable for the following components:   Glucose-Capillary 158 (*)    All other components within normal limits  TROPONIN I (HIGH SENSITIVITY) - Abnormal; Notable for the following components:   Troponin I (High Sensitivity) 57 (*)    All other components within normal limits  TROPONIN I (HIGH SENSITIVITY) - Abnormal; Notable for the following components:   Troponin I (High Sensitivity) 49 (*)    All other components within normal limits  RESPIRATORY PANEL BY RT PCR (FLU A&B, COVID)  BLOOD CULTURE ID PANEL (REFLEXED) - BCID2  CULTURE, BLOOD (ROUTINE X 2)  CULTURE, BLOOD (ROUTINE X 2)  LACTIC ACID, PLASMA  SODIUM, URINE, RANDOM  CREATININE, URINE, RANDOM  POC OCCULT BLOOD, ED    CT HEAD WO CONTRAST  Final Result    CT ABDOMEN PELVIS WO CONTRAST  Final Result    CT Chest Wo Contrast  Final Result    CT CERVICAL SPINE WO CONTRAST  Final Result    DG Chest Port 1 View  Final Result      Medications  sodium chloride 0.9 % bolus 1,000 mL (0 mLs  Intravenous Stopped Jan 14, 2020 2228)  haloperidol lactate (HALDOL) injection 5 mg (5 mg Intramuscular Given Jan 14, 2020 1925)  LORazepam (ATIVAN) injection 1 mg (1 mg Intravenous Given Jan 14, 2020 2056)  cefTRIAXone (ROCEPHIN) 2 g in sodium chloride 0.9 % 100 mL IVPB (0 g Intravenous Stopped Jan 14, 2020 2306)  metroNIDAZOLE (FLAGYL) IVPB 500 mg (0 mg Intravenous Stopped 09/10/20 0028)     Procedures  /  Critical Care .Critical Care Performed by: Sabas SousBero, Yarel Rushlow M, MD Authorized by: Sabas SousBero, Arris Meyn M, MD   Critical care provider statement:    Critical care time (minutes):  32   Critical care was  necessary to treat or prevent imminent or life-threatening deterioration of the following conditions: end of life care discussions.   Critical care was time spent personally by me on the following activities:  Discussions with consultants, evaluation of patient's response to treatment, examination of patient, ordering and performing treatments and interventions, ordering and review of laboratory studies, ordering and review of radiographic studies, pulse oximetry, re-evaluation of patient's condition, obtaining history from patient or surrogate and review of old charts    ED Course and Medical Decision Making  I have reviewed the triage vital signs, the nursing notes, and pertinent available records from the EMR.  Listed above are laboratory and imaging tests that I personally ordered, reviewed, and interpreted and then considered in my medical decision making (see below for details).  Unclear etiology of altered mental status, considering metabolic disarray, sepsis, UTI.  Labs reveal mild CK elevation, troponin elevation, CT with question colitis versus tumor.  Overall concern for patient undergoing the dying process.  Admitted to hospital service for further care.       Elmer SowMichael M. Pilar PlateBero, MD [facility] [email or web address]  Final Clinical Impressions(s) / ED Diagnoses     ICD-10-CM   1. Colitis, acute  K52.9   2. Altered mental status, unspecified altered mental status type  R41.82     ED Discharge Orders    None       Discharge Instructions Discussed with and Provided to Patient:   Discharge Instructions   None       Sabas SousBero, Envi Eagleson M, MD 09/16/20 2339    Sabas SousBero, Kahlil Cowans M, MD 09/24/20 [phone]

## 2020-09-23 NOTE — Death Summary Note (Signed)
DEATH SUMMARY   Patient Details  Name: John Mason MRN: [id number] DOB: 1929-06-01  Admission/Discharge Information   Admit Date:  09/28/2020  Date of Death:  10/02/20  Time of Death:  February 06, 1623  Length of Stay: 3  Referring Physician: Lauree Chandler, NP   Reason(s) for Hospitalization  Altered Mental Status and Generalized Weakness  Diagnoses  Preliminary cause of death:  Acute Cardiopulmonary Arrest in the setting of Pseudomonas Luteola Bacteremia complicated by AKI, Hyperkalemia, Hypernatremia, Acute Metabolic Encephalopathy and Generalized Weakness as well as concern for Proctitis vs a Tumor at the junction of the Sigmoid Colon and Rectum   Secondary Diagnoses (including complications and co-morbidities):  Principal Problem:   Acute encephalopathy Active Problems:   Essential hypertension   CAD (coronary artery disease)   AKI (acute kidney injury) ([place])   HLD (hyperlipidemia)   BPH (benign prostatic hyperplasia)   Paroxysmal atrial fibrillation (HCC)   Chronic combined systolic and diastolic CHF (congestive heart failure) (HCC)   Hyperkalemia   Proctitis   Abnormal CT of the abdomen   Dehydration  Abnormal CT abdomen and pelvis/concern for proctitis versus tumor -Patient had presented with altered mental status, acute kidney injury and concern for proctitis.  -CT abdomen and pelvis which was done showed changes concerning for proctitis versus tumor at the junction of the sigmoid colon and rectum with a segment of 7 cm.  -Lactic acid noted to be elevated on admission and patient was placed on IV fluids for hydration andLactic acid levels improved down to 1.5.  -Leukocytosis had gone from 10.5 -> 12.1 and worsened to 12.9.  -Continued IV Rocephin and IV Flagyl but escalated to IV Cefepime given Pseudomonas Luteola Bacteremia and then stopped when family elected Comfort Measures  -If patient developed loose stools would have checked a C. difficile PCR and GI  pathogen panel. Per admitting MD patient with no bowel movement in the past 2 days PTA.  -Consulted with GI for further evaluation and management felt that the patient had a complex clinical picture but essentially it appeared to be either inflammatory rectosigmoid process or more likely a malignancy and they recommended entirely with a conservative plan on this poor debilitated patient and recommended palliative measures. -Palliative consulted for further evaluation and goals of care discussion and after further discussion with the patient's daughter she elected to transition the patient to comfort measures given that he he appeared to be actively dying.  All medications not in the patient's comfort were discontinued and he has been given Dilaudid as needed for pain or shortness breath, Ativan for anxiety, Haldol for agitation nausea as well as Robinul for excessive secretions.  As the patient appears uncomfortable his transition to a Dilaudid drip at 0.5 mg/h and this was increased to 1 mg/h today -Patient did not appear stable enough to consider transport to hospice and anticipating in hospital death given his extremely poor prognosis;  today he was unresponsive to physical verbal stimuli and resting comfortably; continues to have short shallow breathing now and extremities are started become cool -As expected patient further decompensated and passed away at 1624 and nursing notified the family  Pseudomonas Luteola Bacteremia -Noted on blood cultures from 09/28/20 -As above; antibiotics were escalated IV cefepime however patient has been transitioned to comfort measures only now and all medications not in the patient's comfort has been discontinued and he was be started on a Dilaudid drip with as needed Dilaudid IV as needed and his Dilaudid drip was uptitrated -he  was treated with antibiotics up until the point that he was made comfort measures  Acute Metabolic Encephalopathy -In the setting of  underlying cognitive impairment at baseline.  -Patient was noted to have a worsening confusion over the past 2 days likely secondary to concern for proctitis, severe dehydration, acute kidney injury.  -Continued hydration with IV fluids but had stopped IV sodium chloride and started D5W given his hyperkalemia and hyperchloremia but this was discontinued when family elected to go comfort measures -Empiric IV antibiotics were changed but now has been discontinued when family elected to go for comfort measures -Continued with supportive care given the patient was a very high risk for decompensation and as expected had a in-hospital death  Acute Kidney Injury -Secondary to a prerenal azotemia secondary to hypovolemia/dehydration in the setting of GI losses and ARB and spironolactone.  -ARB and spironolactone were held -Urinalysis nitrite negative, leukocytes negative, 100 protein. Urine sodium 140. Urine creatinine 143.87.  -CT abdomen and pelvis with no noted hydronephrosis.  -Renal function with minimal improvement from admission as BUN/Cr went from 52/1.75 -> 55/1.71 -> 62/1.62 up until the last point is checked -Increased IV fluids to 100cc an hour but changed to D5W at 75 mL/hr given Hypernatremia, Hyperchloremia, and Hyperkalemia but this was stopped as he was transitioned to comfort measures -Patient now full comfort measures and on Dilaudid drip was initiated and would not repeat further blood work  Hyperkalemia -In the setting of acute kidney injury and ARB and spironolactone use.  -ARB and spironolactone were held -Improved with hydration but worsened back to 5.4 on last check -Changed IVF to D5W and gave a dose of Lokelma but will not check anymore as patient was transitioned to FULL Comfort Measures and will stop IVF and not recheck labs  Hyperphosphatemia -Patient's phosphorus level from 6.5 and slightly improved to 5.3 on last check -We will not recheck given patient was  transitioned to comfort measures  Hypermagnesemia -Patient magnesium levels 2.7 -Was not rechecked as patient was transitioned to comfort measures  Hypernatremia/Hyperchloremia -Patient sodium went from 143 is now 148 and chloride level went from 107 and is now 113 -Setting of normal saline resuscitation -Changed IV fluids from normal saline to D5W and change the rate to 75 MLS per hour however because of the shift of focus to comfort measures IV fluids will now be discontinued and will not check CMP -As expected the patient did not survive this hospitalization  Chronic Combined Systolic and Diastolic CHF Coronary arterydisease status post PCI -Last 2D echo with a EF of 20%, grade 2 diastolic dysfunction.  -Patient volume depleted, dehydrated.  -Continued hydration with IV fluids but changed to above.  -Continue to hold spironolactone and ARB while he was hospitalized and wemonitored volume status closely with hydration.  -Continue statin up until the point where he became comfort measures and he is not on aspirin due to GI bleed risk -As Above patient is being transitioned to FULL Comfort Measures  Paroxysmal atrial fibrillation -Recently diagnosed within the last 6 months.  -Patient currently somnolent and likely unable to take any oral intake at this time.  -Placed on IV Lopressor 2.5 mg every 6 hours for rate control but this was stopped when he is transition to comfort measures -Not on anticoagulation aspirin due to recent GI bleed.  -Patient is being transitioned to Comfort Measures and telemetry was discontinued  Hypertension -Placed on IV metoprolol but this will be discontinued as he is being transitioned to  FULL Comfort Measures and blood pressures are only checked per protocol  BPH -His Proscar was held and was going to be initiated once he is tolerating an oral diet but he is transition to comfort measures and this was now stopped  Dehydration -IV fluids  now discontinued   Generalized Weakness/Deconditioning Goals of Care -Patient noted to have significant deconditioning, failure to thrive prior to admission.  -Admitting physician discussed with patient's daughter on admission in terms of patient's deterioration.  -Patient currently DNR/DNI.  -Patient was monitored over the first 48 hours of admission and since he had no significant clinical improvement Daughter decided to shift focus to comfort measures.  -Palliative care consulted and appreciate [place] discussion and he is now on a Dilaudid drip and had end-of-life/terminal care and Dilaudid drip was escalated to 1 mg.  Patient further decompensated and subsequently passed away on 10-Oct-2020 at 49 and family was notified of the patient's passing  By the nursing staff   [facility] Hospital Course (including significant findings, care, treatment, and services provided and events leading to death)  HPI per Dr. Zada Finders on 09/20/2020 John Hanlon Stultzis a 86 y.o.malewith medical history significant forCAD s/p PCI, chronic diastolic CHF(EF 82%, G2 DD on TTE 04/24/2020), paroxysmal atrial fibrillation not on anticoagulation due to GI bleeding, severe aortic regurgitation, HTN, HLD, BPHwho presents to the ED for evaluation of progressive altered mental status and generalized weakness.Patient is unable to provide history due to somnolence therefore majority of history is obtained from daughter at bedside, EDP, and chart review.  Daughter states that over the last several months patient has had some memory deficits however has been otherwise functioning well. She says he has chronic loose stools however about a week ago he had significantly more frequent diarrhea. Over the last 2 days he has had a significant change in which he has been very fatigued. He has been staying in bed mostly, only occasionally getting up into a chair. He has not been eating and therefore has not had any further bowel  movements last 2 days. He has been much less interactive and not communicating as he normally has. He has been generally weak with a fall at home. He did not have any significant injury.   He has not been communicating his needs and only stated that he hurt all over. Daughter states that he was recently given home oxygen to use at night, however patient says he has not wanted to use it. Daughter says that he was very agitated on arrival to the ED however is now resting peacefully after receiving medications for anxiety/agitation.  ED Course: Initial vitals showed BP 141/97, pulse 96, RR 20, temp 97.9 Fahrenheit, SPO2 98% on room air.  Labs show WBC 10.5, hemoglobin 12.9, platelets 175,000, sodium 143, potassium 5.3, bicarb 18, BUN 52, creatinine 1.75, serum glucose 165, AST 51, ALT 33, alk phos 79, total bilirubin 2.3, CK 794 high-sensitivity troponin I 57 > 49, lactic acid 3.4.  Urinalysis showed negative nitrites, negative leukocytes, >50 RBC/hpf, 0-5 WBC/hpf, rare bacteria microscopy.  VBG showed pH 7.337, PCO2 31.2, PO2 86.1.  Portable chest x-ray showed mild bibasilar atelectasis, chronic appearing interstitial lung markings, and cardiomegaly.  CT head without contrast showed atrophy and chronic microvascular disease without acute intracranial abnormality.  CT cervical spine without contrast showed diffuse degenerative disc and facet disease without acute bony abnormality.  CT chest/abdomen/pelvis without contrast showed irregular wall thickening at the junction of the sigmoid colon and rectum over 7 cm  segment indeterminate for tumor versus proctitis. Cardiomegaly with small/trace bilateral pleural effusions noted. Bilateral pulmonary reticular and groundglass opacities also seen felt to reflect interstitial edema versus atelectasis superimposed on degree of chronic lung disease. No other acute or inflammatory process identified in the chest/abdomen/pelvis.  Patient was  given IV ceftriaxone and Flagyl. Patient was agitated in the ED and received 1 mg IV Ativan and 5 mg IM Haldol. He also received 1 L normal saline. The hospitalist service was consulted to admit for further evaluation and management.  **Interim History Further work-up revealed that patient had a Pseudomonas Luteola bacteremia.  Yesterday (09/11/20) His labs were worsened somewhat with minimal improvement in his renal function.  After further goals of care discussion the patient and daughter elected to transition the patient to full comfort measures and if patient survives the night and is stable for the morning can be discharged to residential hospice however we expect in-hospital death given his poor prognosis and overall deterioration.  Patient appears uncomfortable on examination and comfort measures have been enacted and he has been placed on Dilaudid, Ativan, Haldol, as well as glycopyrrolate for comfort measures and all other measures have been discontinued.  Yesterday The patient was resting but started having short and shallow breathing and after discussion with palliative care we will change him to a Dilaudid drip for further comfort.  Anticipate in-hospital death as prognosis extremely poor and guarded and he is a high risk for further decompensation.  Today the patient remained unresponsive to physical and verbal stimuli but was appearing comfortable and resting.  Continued to have a short shallow breathing and extremities are starting to become cool.  His Dilaudid drip was increased to 1 mg/h by palliative care.  He is not safe to transfer to residential or home hospice and as expected patient had further decompensation and had a in-hospital death and passed away on 09/18/20 at 4 and family was notified of the patient's passing by the nursing staff.  Pertinent Labs and Studies  Significant Diagnostic Studies CT ABDOMEN PELVIS WO CONTRAST  Result Date: 09/14/2020 CLINICAL DATA:   86 year old male with weakness, altered mental status. Chest and abdominal pain. EXAM: CT CHEST, ABDOMEN AND PELVIS WITHOUT CONTRAST TECHNIQUE: Multidetector CT imaging of the chest, abdomen and pelvis was performed following the standard protocol without IV contrast. COMPARISON:  Portable chest radiograph today. FINDINGS: CT CHEST FINDINGS Cardiovascular: Calcified coronary artery and aortic atherosclerosis. Cardiomegaly. No pericardial effusion. Vascular patency is not evaluated in the absence of IV contrast. Mediastinum/Nodes: No mediastinal or hilar lymphadenopathy is evident. Lungs/Pleura: Small layering left pleural effusion with simple fluid density favoring transudate. Trace layering right pleural effusion. Mild respiratory motion. Major airways are patent but with atelectatic changes. Bilateral peripheral and dependent pulmonary reticular and ground-glass opacity. Some associated septal thickening mostly in the lung apices. No consolidation. Musculoskeletal: Osteopenia. Mild T12 superior endplate Schmorl's node. No acute osseous abnormality identified. CT ABDOMEN PELVIS FINDINGS Hepatobiliary: Negative noncontrast liver and gallbladder. Pancreas: Atrophied. Spleen: Negative. Adrenals/Urinary Tract: Normal adrenal glands. Benign small renal cysts suspected in both kidneys. No hydronephrosis or hydroureter. There is a punctate right lower pole renal calculus on coronal image 80. A small bladder dome diverticulum is suspected on series 3, image 110. Otherwise the bladder is within normal limits. Stomach/Bowel: Low-density retained stool in the rectum. Mild motion artifact in the mid abdomen. There is irregular wall thickening of large bowel at the junction of the sigmoid and rectum (coronal images 68 through 78) measuring up  to 15 mm in thickness over a segment of about 7 cm. There is regional dependent presacral stranding although no directly adjacent pericolonic stranding. No regional lymphadenopathy.  Redundant sigmoid colon. Decompressed descending colon. Redundant transverse colon with retained stool. Similar retained stool in the right colon. No other large bowel wall thickening identified. Diminutive or absent appendix. Motion artifact at the ileocecal valve. Terminal ileum appears negative. No dilated small bowel. Decompressed stomach. No free air. No free fluid in the abdomen. No small bowel mesenteric stranding. Vascular/Lymphatic: Aortoiliac calcified atherosclerosis. Normal caliber aorta. Vascular patency is not evaluated in the absence of IV contrast. No lymphadenopathy. Reproductive: Negative. Other: Mild presacral stranding.  No overt pelvic free fluid. Musculoskeletal: Diffuse lumbar vacuum disc. Lumbar scoliosis. Osteopenia. No acute osseous abnormality identified. IMPRESSION: 1. Irregular wall thickening at the junction of the sigmoid colon and rectum over a segment of about 7 cm appears indeterminate for Tumor versus Proctitis. Regional presacral inflammatory stranding. No lymphadenopathy. 2. Cardiomegaly with small layering left and trace right pleural effusions. Bilateral pulmonary reticular and ground-glass opacity could reflect interstitial edema and/or atelectasis superimposed on a degree of chronic lung disease. Viral/atypical respiratory infection is felt less likely. 3. No other acute or inflammatory process identified in the non-contrast chest, abdomen, or pelvis. 4. Calcified coronary artery and Aortic Atherosclerosis (ICD10-I70.0). Right nephrolithiasis. Electronically Signed   By: Genevie Ann M.D.   On: 09/14/2020 21:49   CT HEAD WO CONTRAST  Result Date: 08/27/2020 CLINICAL DATA:  Generalized weakness, altered mental status EXAM: CT HEAD WITHOUT CONTRAST TECHNIQUE: Contiguous axial images were obtained from the base of the skull through the vertex without intravenous contrast. COMPARISON:  None. FINDINGS: Brain: There is atrophy and chronic small vessel disease changes. No acute  intracranial abnormality. Specifically, no hemorrhage, hydrocephalus, mass lesion, acute infarction, or significant intracranial injury. Vascular: No hyperdense vessel or unexpected calcification. Skull: No acute calvarial abnormality. Sinuses/Orbits: Visualized paranasal sinuses and mastoids clear. Orbital soft tissues unremarkable. Other: None IMPRESSION: Atrophy, chronic microvascular disease. No acute intracranial abnormality. Electronically Signed   By: Rolm Baptise M.D.   On: 09/04/2020 21:37   CT Chest Wo Contrast  Result Date: 09/14/2020 CLINICAL DATA:  86 year old male with weakness, altered mental status. Chest and abdominal pain. EXAM: CT CHEST, ABDOMEN AND PELVIS WITHOUT CONTRAST TECHNIQUE: Multidetector CT imaging of the chest, abdomen and pelvis was performed following the standard protocol without IV contrast. COMPARISON:  Portable chest radiograph today. FINDINGS: CT CHEST FINDINGS Cardiovascular: Calcified coronary artery and aortic atherosclerosis. Cardiomegaly. No pericardial effusion. Vascular patency is not evaluated in the absence of IV contrast. Mediastinum/Nodes: No mediastinal or hilar lymphadenopathy is evident. Lungs/Pleura: Small layering left pleural effusion with simple fluid density favoring transudate. Trace layering right pleural effusion. Mild respiratory motion. Major airways are patent but with atelectatic changes. Bilateral peripheral and dependent pulmonary reticular and ground-glass opacity. Some associated septal thickening mostly in the lung apices. No consolidation. Musculoskeletal: Osteopenia. Mild T12 superior endplate Schmorl's node. No acute osseous abnormality identified. CT ABDOMEN PELVIS FINDINGS Hepatobiliary: Negative noncontrast liver and gallbladder. Pancreas: Atrophied. Spleen: Negative. Adrenals/Urinary Tract: Normal adrenal glands. Benign small renal cysts suspected in both kidneys. No hydronephrosis or hydroureter. There is a punctate right lower pole  renal calculus on coronal image 80. A small bladder dome diverticulum is suspected on series 3, image 110. Otherwise the bladder is within normal limits. Stomach/Bowel: Low-density retained stool in the rectum. Mild motion artifact in the mid abdomen. There is irregular wall thickening of large  bowel at the junction of the sigmoid and rectum (coronal images 68 through 78) measuring up to 15 mm in thickness over a segment of about 7 cm. There is regional dependent presacral stranding although no directly adjacent pericolonic stranding. No regional lymphadenopathy. Redundant sigmoid colon. Decompressed descending colon. Redundant transverse colon with retained stool. Similar retained stool in the right colon. No other large bowel wall thickening identified. Diminutive or absent appendix. Motion artifact at the ileocecal valve. Terminal ileum appears negative. No dilated small bowel. Decompressed stomach. No free air. No free fluid in the abdomen. No small bowel mesenteric stranding. Vascular/Lymphatic: Aortoiliac calcified atherosclerosis. Normal caliber aorta. Vascular patency is not evaluated in the absence of IV contrast. No lymphadenopathy. Reproductive: Negative. Other: Mild presacral stranding.  No overt pelvic free fluid. Musculoskeletal: Diffuse lumbar vacuum disc. Lumbar scoliosis. Osteopenia. No acute osseous abnormality identified. IMPRESSION: 1. Irregular wall thickening at the junction of the sigmoid colon and rectum over a segment of about 7 cm appears indeterminate for Tumor versus Proctitis. Regional presacral inflammatory stranding. No lymphadenopathy. 2. Cardiomegaly with small layering left and trace right pleural effusions. Bilateral pulmonary reticular and ground-glass opacity could reflect interstitial edema and/or atelectasis superimposed on a degree of chronic lung disease. Viral/atypical respiratory infection is felt less likely. 3. No other acute or inflammatory process identified in the  non-contrast chest, abdomen, or pelvis. 4. Calcified coronary artery and Aortic Atherosclerosis (ICD10-I70.0). Right nephrolithiasis. Electronically Signed   By: Genevie Ann M.D.   On: 09/18/2020 21:49   CT CERVICAL SPINE WO CONTRAST  Result Date: 09/19/2020 CLINICAL DATA:  Generalized weakness, altered mental status EXAM: CT CERVICAL SPINE WITHOUT CONTRAST TECHNIQUE: Multidetector CT imaging of the cervical spine was performed without intravenous contrast. Multiplanar CT image reconstructions were also generated. COMPARISON:  None FINDINGS: Alignment: Slight degenerative retrolisthesis of C4 on C5. Skull base and vertebrae: No acute fracture. No primary bone lesion or focal pathologic process. Soft tissues and spinal canal: No prevertebral fluid or swelling. No visible canal hematoma. Disc levels: Diffuse moderate degenerative disc disease and facet disease bilaterally, left greater than right. Upper chest: Layering bilateral effusions noted. Other: None IMPRESSION: Diffuse degenerative disc and facet disease. No acute bony abnormality. Layering bilateral pleural effusions. Electronically Signed   By: Rolm Baptise M.D.   On: 09/10/2020 21:39   DG Chest Port 1 View  Result Date: 09/07/2020 CLINICAL DATA:  Weakness and altered mental status. EXAM: PORTABLE CHEST 1 VIEW COMPARISON:  None. FINDINGS: Mild, diffuse, chronic appearing increased interstitial lung markings are seen. Mild areas of atelectasis and/or infiltrate are also seen within the bilateral lung bases. There is mild to moderate severity enlargement of the cardiac silhouette. Mild calcification of the thoracic aorta is seen. The visualized skeletal structures are unremarkable. IMPRESSION: 1. Mild bibasilar atelectasis and/or infiltrate. 2. Mild, diffuse, chronic appearing increased interstitial lung markings. 3. Mild to moderate severity cardiomegaly. Electronically Signed   By: Virgina Norfolk M.D.   On: 09/01/2020 20:13   Consultants:    Gastroenterology  Palliative Care Medicine   Microbiology Recent Results (from the past 240 hour(s))  Urine culture     Status: Abnormal   Collection Time: 09/05/2020  7:05 PM   Specimen: Urine, Clean Catch  Result Value Ref Range Status   Specimen Description   Final    URINE, CLEAN CATCH Performed at [facility], [place] [address], [place], [place] [address]    Special Requests   Final    NONE Performed at  [facility], [place] [address], [place], [place] [address]    Culture MULTIPLE SPECIES PRESENT, SUGGEST RECOLLECTION (A)  Final   Report Status 09/11/2020 FINAL  Final  Culture, blood (single) w Reflex to ID Panel     Status: Abnormal   Collection Time: 09/14/2020  7:20 PM   Specimen: BLOOD RIGHT FOREARM  Result Value Ref Range Status   Specimen Description   Final    BLOOD RIGHT FOREARM Performed at [facility], [place] [address] [place] [address], [place], [place] [address]    Special Requests   Final    BOTTLES DRAWN AEROBIC AND ANAEROBIC Blood Culture results may not be optimal due to an inadequate volume of blood received in culture bottles Performed at [place] [address] [place], [place] [address]    Culture  Setup Time   Final    AEROBIC BOTTLE ONLY GRAM NEGATIVE RODS Organism ID to follow CRITICAL RESULT CALLED TO, READ BACK BY AND VERIFIED WITHSeleta Rhymes [facility] 09/10/20 2303 JDW Performed at [facility], [address], [place], [place] [address]    Culture PSEUDOMONAS LUTEOLA (A)  Final   Report Status 09/12/2020 FINAL  Final   Organism ID, Bacteria PSEUDOMONAS LUTEOLA  Final      Susceptibility   Pseudomonas luteola - MIC*    CEFTAZIDIME <=1 SENSITIVE Sensitive     CIPROFLOXACIN <=0.25 SENSITIVE Sensitive     GENTAMICIN <=1 SENSITIVE Sensitive     IMIPENEM <=0.25 SENSITIVE Sensitive     PIP/TAZO <=4 SENSITIVE Sensitive     * PSEUDOMONAS LUTEOLA  Blood Culture ID Panel (Reflexed)     Status: None    Collection Time: 09/21/2020  7:20 PM  Result Value Ref Range Status   Enterococcus faecalis NOT DETECTED NOT DETECTED Final   Enterococcus Faecium NOT DETECTED NOT DETECTED Final   Listeria monocytogenes NOT DETECTED NOT DETECTED Final   Staphylococcus species NOT DETECTED NOT DETECTED Final   Staphylococcus aureus (BCID) NOT DETECTED NOT DETECTED Final   Staphylococcus epidermidis NOT DETECTED NOT DETECTED Final   Staphylococcus lugdunensis NOT DETECTED NOT DETECTED Final   Streptococcus species NOT DETECTED NOT DETECTED Final   Streptococcus agalactiae NOT DETECTED NOT DETECTED Final   Streptococcus pneumoniae NOT DETECTED NOT DETECTED Final   Streptococcus pyogenes NOT DETECTED NOT DETECTED Final   A.calcoaceticus-baumannii NOT DETECTED NOT DETECTED Final   Bacteroides fragilis NOT DETECTED NOT DETECTED Final   Enterobacterales NOT DETECTED NOT DETECTED Final   Enterobacter cloacae complex NOT DETECTED NOT DETECTED Final   Escherichia coli NOT DETECTED NOT DETECTED Final   Klebsiella aerogenes NOT DETECTED NOT DETECTED Final   Klebsiella oxytoca NOT DETECTED NOT DETECTED Final   Klebsiella pneumoniae NOT DETECTED NOT DETECTED Final   Proteus species NOT DETECTED NOT DETECTED Final   Salmonella species NOT DETECTED NOT DETECTED Final   Serratia marcescens NOT DETECTED NOT DETECTED Final   Haemophilus influenzae NOT DETECTED NOT DETECTED Final   Neisseria meningitidis NOT DETECTED NOT DETECTED Final   Pseudomonas aeruginosa NOT DETECTED NOT DETECTED Final   Stenotrophomonas maltophilia NOT DETECTED NOT DETECTED Final   Candida albicans NOT DETECTED NOT DETECTED Final   Candida auris NOT DETECTED NOT DETECTED Final   Candida glabrata NOT DETECTED NOT DETECTED Final   Candida krusei NOT DETECTED NOT DETECTED Final   Candida parapsilosis NOT DETECTED NOT DETECTED Final   Candida tropicalis NOT DETECTED NOT DETECTED Final   Cryptococcus neoformans/gattii NOT DETECTED NOT DETECTED  Final  Comment: Performed at [facility], [place] [address], [place], [place] [address]  Respiratory Panel by RT PCR (Flu A&B, Covid) - Nasopharyngeal Swab     Status: None   Collection Time: 09/19/2020  8:57 PM   Specimen: Nasopharyngeal Swab  Result Value Ref Range Status   SARS Coronavirus 2 by RT PCR NEGATIVE NEGATIVE Final    Comment: (NOTE) SARS-CoV-2 target nucleic acids are NOT DETECTED.  The SARS-CoV-2 RNA is generally detectable in upper respiratoy specimens during the acute phase of infection. The lowest concentration of SARS-CoV-2 viral copies this assay can detect is 131 copies/mL. A negative result does not preclude SARS-Cov-2 infection and should not be used as the sole basis for treatment or other patient management decisions. A negative result may occur with  improper specimen collection/handling, submission of specimen other than nasopharyngeal swab, presence of viral mutation(s) within the areas targeted by this assay, and inadequate number of viral copies (<131 copies/mL). A negative result must be combined with clinical observations, patient history, and epidemiological information. The expected result is Negative.  Fact Sheet for Patients:  PinkCheek.be  Fact Sheet for Healthcare Providers:  GravelBags.it  This test is no t yet approved or cleared by the [place] FDA and  has been authorized for detection and/or diagnosis of SARS-CoV-2 by FDA under an Emergency Use Authorization (EUA). This EUA will remain  in effect (meaning this test can be used) for the duration of the COVID-19 declaration under Section 564(b)(1) of the Act, 21 U.S.C. section 360bbb-3(b)(1), unless the authorization is terminated or revoked sooner.     Influenza A by PCR NEGATIVE NEGATIVE Final   Influenza B by PCR NEGATIVE NEGATIVE Final    Comment: (NOTE) The Xpert Xpress SARS-CoV-2/FLU/RSV assay is intended as an aid  in  the diagnosis of influenza from Nasopharyngeal swab specimens and  should not be used as a sole basis for treatment. Nasal washings and  aspirates are unacceptable for Xpert Xpress SARS-CoV-2/FLU/RSV  testing.  Fact Sheet for Patients: PinkCheek.be  Fact Sheet for Healthcare Providers: GravelBags.it  This test is not yet approved or cleared by the [place] FDA and  has been authorized for detection and/or diagnosis of SARS-CoV-2 by  FDA under an Emergency Use Authorization (EUA). This EUA will remain  in effect (meaning this test can be used) for the duration of the  Covid-19 declaration under Section 564(b)(1) of the Act, 21  U.S.C. section 360bbb-3(b)(1), unless the authorization is  terminated or revoked. Performed at [facility], [place] [address] [place], [place] [address]   Culture, blood (routine x 2)     Status: None (Preliminary result)   Collection Time: 09/11/20 12:30 AM   Specimen: BLOOD  Result Value Ref Range Status   Specimen Description   Final    BLOOD RIGHT WRIST Performed at [facility] [address], [place], [place] [address]    Special Requests   Final    BOTTLES DRAWN AEROBIC ONLY Blood Culture adequate volume Performed at [facility] [address], [place], [place] [address]    Culture   Final    NO GROWTH 2 DAYS Performed at [facility] [address], [place], [place] [address]    Report Status PENDING  Incomplete  Culture, blood (routine x 2)     Status: None (Preliminary result)   Collection Time: 09/11/20 12:30 AM   Specimen: BLOOD  Result Value Ref Range Status  Specimen Description   Final    BLOOD RIGHT ANTECUBITAL Performed at [place] [address] [place], [place] [address]    Special Requests   Final    BOTTLES DRAWN AEROBIC AND ANAEROBIC Blood Culture adequate  volume Performed at [facility] [address], [place], [place] [address]    Culture   Final    NO GROWTH 2 DAYS Performed at [facility] [address] [place],  [address]    Report Status PENDING  Incomplete    Lab Basic Metabolic Panel: Recent Labs  Lab 08/25/2020 1939 09/10/20 0500 09/11/20 0030  NA 143 143 148*  K 5.3* 5.0 5.4*  CL 107 110 113*  CO2 18* 19* 17*  GLUCOSE 165* 158* 156*  BUN 52* 55* 62*  CREATININE 1.75* 1.71* 1.62*  CALCIUM 9.4 8.9 9.2  MG  --  2.7* 2.7*  PHOS  --  6.5* 5.3*   Liver Function Tests: Recent Labs  Lab 09/01/2020 1939 09/11/20 0030  AST 51*  --   ALT 33  --   ALKPHOS 79  --   BILITOT 2.3*  --   PROT 6.9  --   ALBUMIN 3.9 3.5   No results for input(s): LIPASE, AMYLASE in the last 168 hours. No results for input(s): AMMONIA in the last 168 hours. CBC: Recent Labs  Lab 08/30/2020 1939 09/10/20 0500 09/11/20 0030  WBC 10.5 12.1* 12.9*  NEUTROABS  --   --  11.1*  HGB 12.9* 12.8* 12.4*  HCT 42.8 43.5 41.0  MCV 85.3 86.8 86.1  PLT 175 167 176   Cardiac Enzymes: Recent Labs  Lab 09/12/2020 1939  CKTOTAL 794*   Sepsis Labs: Recent Labs  Lab 09/07/2020 1939 09/07/2020 2055 09/16/2020 2346 09/10/20 0500 09/11/20 0030  WBC 10.5  --   --  12.1* 12.9*  LATICACIDVEN  --  3.4* 1.5  --   --     Procedures/Operations  None  Raiford Noble, DO [facility] 30-Sep-2020, 5:01 PM

## 2020-09-23 NOTE — Progress Notes (Signed)
Pt time of death 55. Dilaudid drip stopped and remaining 20 ml contents wasted with Jilda Panda RN as witness.

## 2020-09-23 NOTE — Progress Notes (Signed)
Daily Progress Note   Patient Name: John Mason       Date: 2020-09-17 DOB: [birth year]  Age: 86 y.o. MRN#: [id number] Attending Physician: Merlene Laughter, DO Primary Care Physician: Sharon Seller, NP Admit Date: 09/15/2020  Reason for Consultation/Follow-up: Establishing goals of care  Subjective: I saw and examined John Mason today.  He was lying in bed in no distress at time of my encounter.    Family at bedside including daughter and granddaughter.    Discussed grief and anxiety related to watching someone you love go through dying process.    Discussed use of oxygen in actively dying patients.  Trialed him off of O2 and he did seem to become more restless.  Anticipatory guidance provided on progression and dying process.  Family understands short prognosis and has made arrangements for the time with John Mason passes.  Length of Stay: 3  Current Medications: Scheduled Meds:  . mouth rinse  15 mL Mouth Rinse BID  . sodium chloride flush  3 mL Intravenous Q12H    Continuous Infusions: . HYDROmorphone 0.5 mg/hr (09/17/20 0656)    PRN Meds: acetaminophen **OR** acetaminophen, antiseptic oral rinse, glycopyrrolate **OR** glycopyrrolate **OR** glycopyrrolate, haloperidol **OR** haloperidol **OR** haloperidol lactate, HYDROmorphone, HYDROmorphone (DILAUDID) injection, LORazepam **OR** LORazepam **OR** LORazepam, ondansetron **OR** ondansetron (ZOFRAN) IV, polyvinyl alcohol  Physical Exam         General: Resting on exam, periods of apnea noted HEENT: No bruits, no goiter, no JVD, oral mucosa dry Heart: Regular rate and rhythm. No murmur appreciated. Lungs: Decreased air movement, Scattered coarse Abdomen: Soft, nontender, nondistended, positive bowel sounds.   Ext: + edema Skin: Warm and dry  Vital Signs: BP (!) 155/56 (BP Location: Right Arm)   Pulse (!) 54   Temp 98.1 F (36.7 C) (Oral)   Resp 14   Ht 5\' 9"  (1.753 m)   Wt 70.6 kg   SpO2 94%   BMI 22.98 kg/m  SpO2: SpO2: 94 % O2 Device: O2 Device: Nasal Cannula O2 Flow Rate: O2 Flow Rate (L/min): 4 L/min  Intake/output summary:   Intake/Output Summary (Last 24 hours) at 2020-09-17 1210 Last data filed at 2020/09/17 0553 Gross per 24 hour  Intake 15.75 ml  Output 750 ml  Net -734.25 ml   LBM: Last BM Date: 09/10/20 Baseline  Weight: Weight: 70.6 kg Most recent weight: Weight: 70.6 kg       Palliative Assessment/Data:    Flowsheet Rows     Most Recent Value  Intake Tab  Referral Department Hospitalist  Unit at Time of Referral ER  Palliative Care Primary Diagnosis Sepsis/Infectious Disease  Date Notified Sep 25, 2020  Palliative Care Type New Palliative care  Reason for referral Clarify Goals of Care  Date of Admission 25-Sep-2020  Date first seen by Palliative Care 09/10/20  # of days Palliative referral response time 1 Day(s)  # of days IP prior to Palliative referral 0  Clinical Assessment  Palliative Performance Scale Score 10%  Psychosocial & Spiritual Assessment  Palliative Care Outcomes  Patient/Family meeting held? Yes  Who was at the meeting? Daughter via phone      Patient Active Problem List   Diagnosis Date Noted  . Abnormal CT of the abdomen   . Dehydration   . Acute encephalopathy 09/25/20  . BPH (benign prostatic hyperplasia) 09-25-2020  . Paroxysmal atrial fibrillation (HCC) 09/25/2020  . Chronic combined systolic and diastolic CHF (congestive heart failure) (HCC) 09-25-20  . Hyperkalemia 2020-09-25  . Proctitis 25-Sep-2020  . Cough 08/26/2016  . Edema 08/26/2016  . Glaucoma 04/15/2016  . Anemia 04/15/2016  . Memory loss 04/15/2016  . HLD (hyperlipidemia) 04/15/2016  . Actinic keratosis 12/17/2015  . Syncope 05/22/2014  . CAD (coronary  artery disease) 05/22/2014  . Bradycardia 05/22/2014  . AKI (acute kidney injury) (HCC) 05/22/2014  . Essential hypertension 10/31/2013  . Urgency incontinence 10/31/2013  . DM type 2 (diabetes mellitus, type 2) (HCC) 03/29/2013  . Unspecified vitamin D deficiency   . Benign essential tremor     Palliative Care Assessment & Plan   Patient Profile: 86 y.o. male  with past medical history of CAD s/p PCI, chronic diastolic CHF with EF 40%, paroxysmal A. fib not on anticoagulation due to GI bleed, severe aortic regurg, hypertension, hyperlipidemia, BPH admitted on 09-25-2020 with altered mental status and weakness.  Work-up revealed likely proctitis and he was started on empiric antibiotics as he also has reported history of diarrhea.  GI has been consulted as well.  He also has hyperkalemia as well as AKI.  In the ED, he remains somnolent other than periods of agitation.  Palliative consulted for goals of care.  Recommendations/Plan:  DNR/DNI  John Mason is actively dying.  Prognosis at this point is likely hours to days.  He is not waking up and is not having meaningful interaction with family.  Discussed that we are at point where supplemental O2 (currently at 4L [place]) is prolonging process of his dying rather than adding quality to his life.   Discussed plan to increase medication for shortness of breath and work to transition off of supplemental O2 later today.  He does not appear to be stable enough to transition from the hospital.  Anticipate hospital death.  Pain/SOB: Plan to increase basal rate to 1.0 mg per hour and then transition off of supplemental oxygen this afternoon.  Continue with an additional 0.5 mg every 1 hour as needed.  Would have low threshold to titrate medication upward if needed to ensure comfort.  Anxiety: Ativan as needed  Agitaiton/nausea: Haldol as needed  Excess secretions: Robinul as needed  Visitation per end of life policy.  Goals of Care and Additional  Recommendations:  Limitations on Scope of Treatment: Full Comfort Care  Code Status:    Code Status Orders  (From admission, onward)  Start     Ordered   09/11/20 1119  Do not attempt resuscitation (DNR)  Continuous       Question Answer Comment  In the event of cardiac or respiratory ARREST Do not call a "code blue"   In the event of cardiac or respiratory ARREST Do not perform Intubation, CPR, defibrillation or ACLS   In the event of cardiac or respiratory ARREST Use medication by any route, position, wound care, and other measures to relive pain and suffering. May use oxygen, suction and manual treatment of airway obstruction as needed for comfort.      09/11/20 1120        Code Status History    Date Active Date Inactive Code Status Order ID Comments User Context   2020-10-01 2348 09/11/2020 1120 DNR [id number]  Charlsie Quest, MD ED   05/22/2014 1638 05/24/2014 1739 Full Code [id number]  Clydia Llano, MD ED   05/07/2014 0608 05/11/2014 1714 Full Code [id number]  Yates Decamp, MD Inpatient   05/07/2014 0602 05/07/2014 0608 Full Code [id number]  Yates Decamp, MD Inpatient   Advance Care Planning Activity    Advance Directive Documentation     Most Recent Value  Type of Advance Directive Out of facility DNR (pink MOST or yellow form)  Pre-existing out of facility DNR order (yellow form or pink MOST form) Physician notified to receive inpatient order  "MOST" Form in Place? --       Prognosis:   Hours - Days  Discharge Planning:  Anticipated Hospital Death  Care plan was discussed with daughter, RN, Dr. Marland Mcalpine  Thank you for allowing the Palliative Medicine Team to assist in the care of this patient.   Time In: 1130 Time Out: 1210 Total Time 40 Prolonged Time Billed No   Greater than 50%  of this time was spent counseling and coordinating care related to the above assessment and plan.  Romie Minus, MD  Please contact Palliative Medicine Team phone at [phone] for  questions and concerns.

## 2020-09-23 DEATH — deceased

## 2020-12-08 IMAGING — CT CT CHEST W/O CM
2 of 4 series · 12 of 36 positions shown, 15 images · non-contrast
Comparison: Portable chest radiograph today.

CLINICAL DATA: 89-year-old male with weakness, altered mental
status. Chest and abdominal pain.

EXAM:
CT CHEST, ABDOMEN AND PELVIS WITHOUT CONTRAST
TECHNIQUE: Multidetector CT imaging of the chest, abdomen and pelvis was
performed following the standard protocol without IV contrast.

[Series 3: cap w/o · axial · non-contrast · 0.76mm/px · z∈[+869,+1404]mm · 9 of 129 slices shown, 12 images]
[im 11/129  mediastinal]
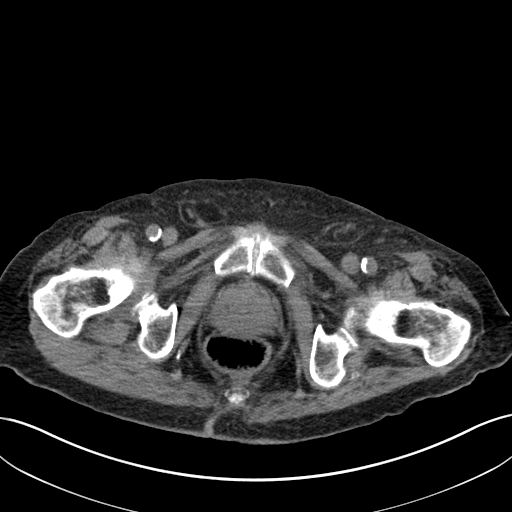
[im 11/129  lung]
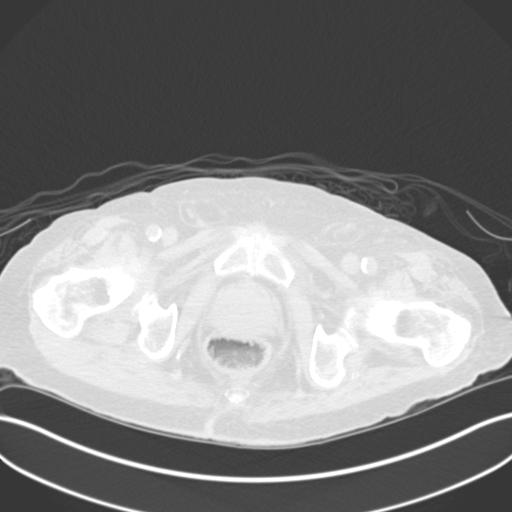
[im 22/129  lung]
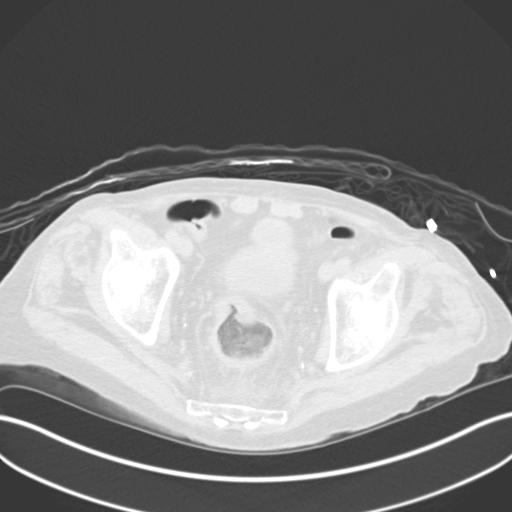
[im 43/129  lung]
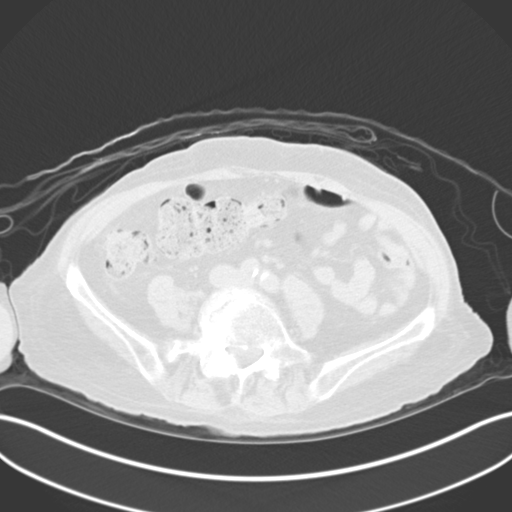
[im 54/129  lung]
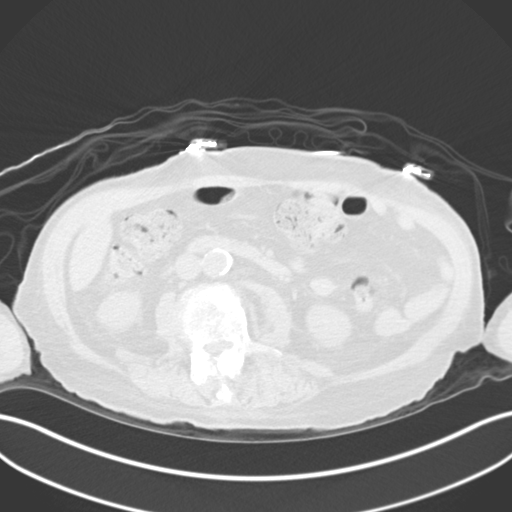
[im 65/129  mediastinal]
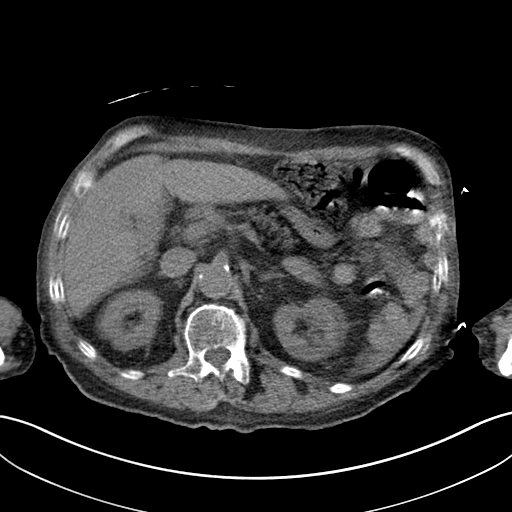
[im 65/129  lung]
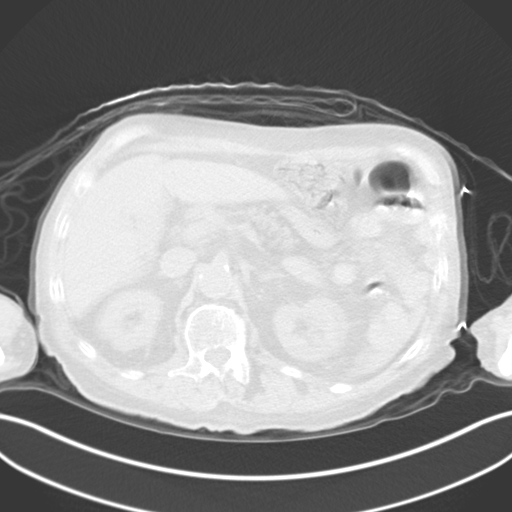
[im 75/129  lung]
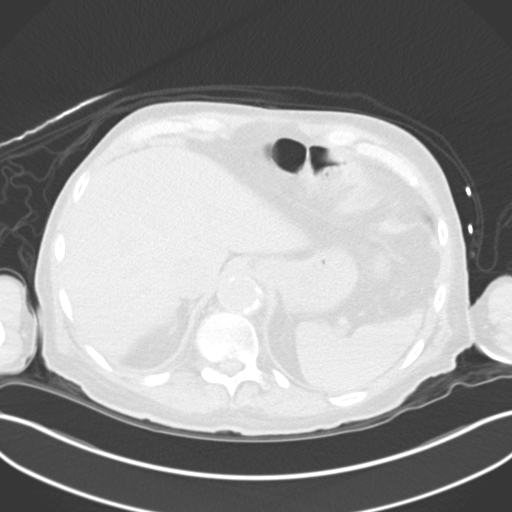
[im 86/129  lung]
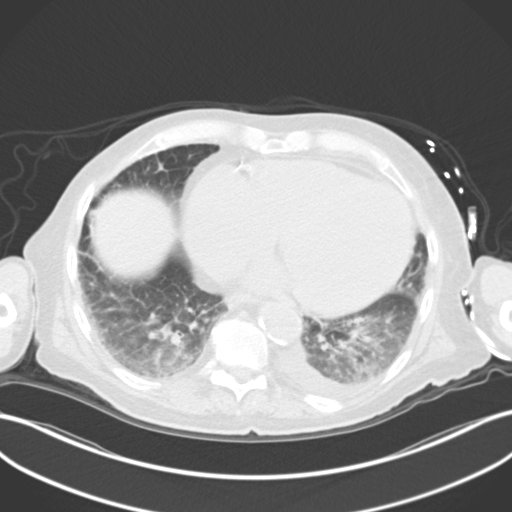
[im 107/129  lung]
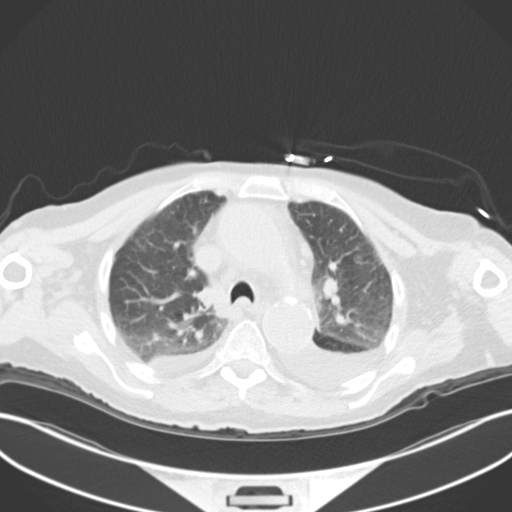
[im 118/129  mediastinal]
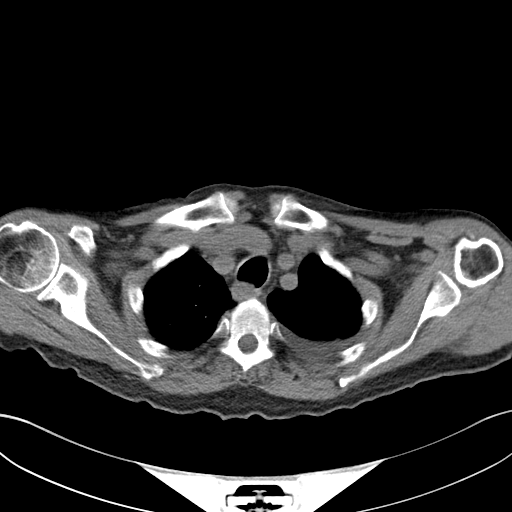
[im 118/129  lung]
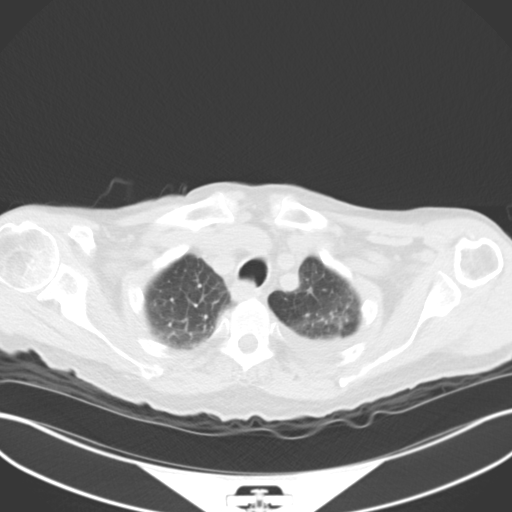

[Series 5: coronals · coronal · 0.71mm/px · 3 of 125 slices shown]
[im 25/125  lung]
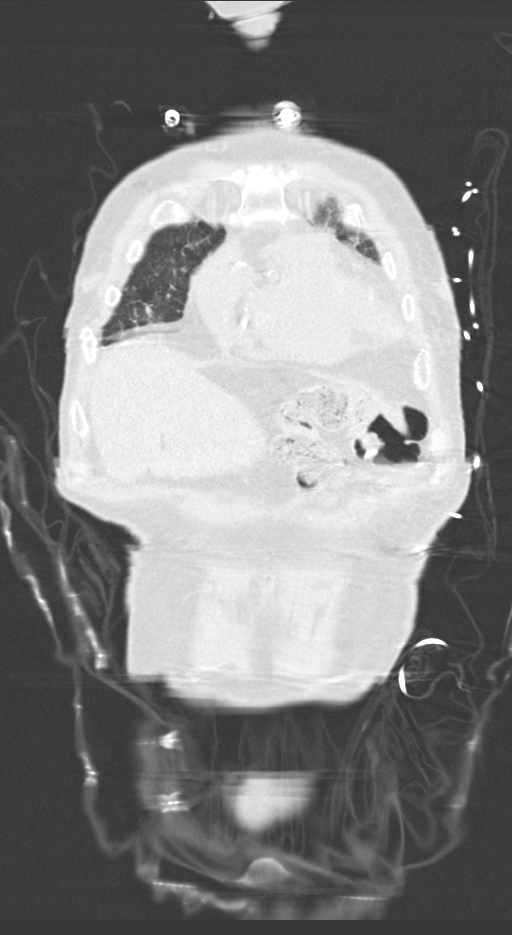
[im 50/125  lung]
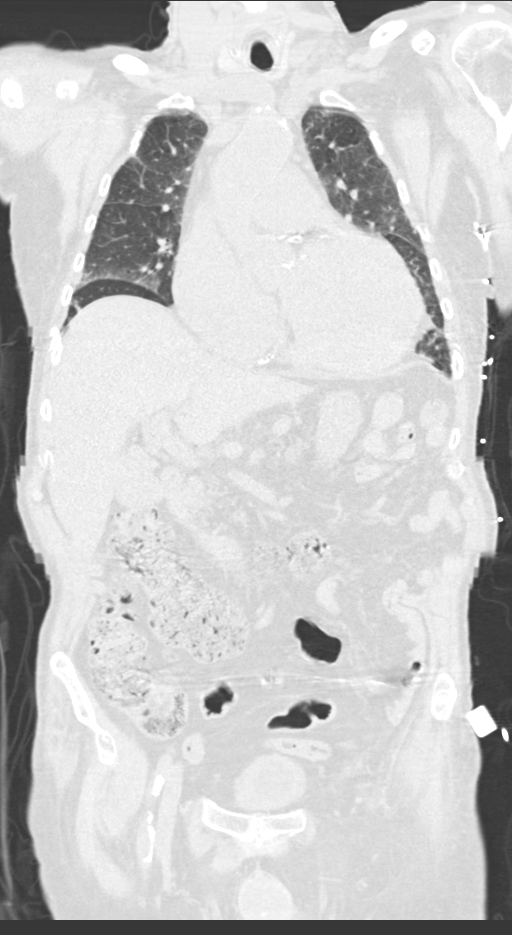
[im 75/125  lung]
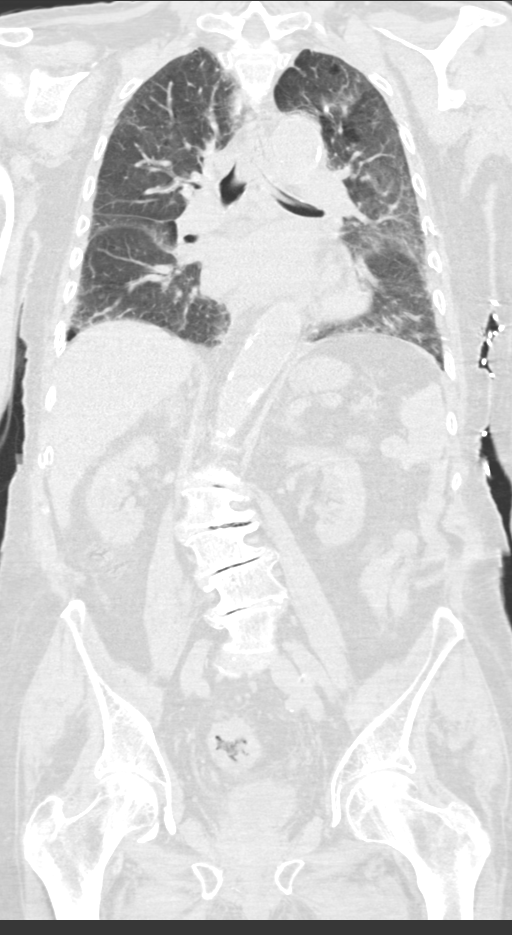

[12 of 36 positions shown; findings below may reference images not displayed]

FINDINGS: CT CHEST FINDINGS

Cardiovascular: Calcified coronary artery and aortic
atherosclerosis. Cardiomegaly. No pericardial effusion. Vascular
patency is not evaluated in the absence of IV contrast.

Mediastinum/Nodes: No mediastinal or hilar lymphadenopathy is
evident.

Lungs/Pleura: Small layering left pleural effusion with simple fluid
density favoring transudate. Trace layering right pleural effusion.

Mild respiratory motion. Major airways are patent but with
atelectatic changes. Bilateral peripheral and dependent pulmonary
reticular and ground-glass opacity. Some associated septal
thickening mostly in the lung apices. No consolidation.

Musculoskeletal: Osteopenia. Mild T12 superior endplate Schmorl's
node. No acute osseous abnormality identified.

CT ABDOMEN PELVIS FINDINGS

Hepatobiliary: Negative noncontrast liver and gallbladder.

Pancreas: Atrophied.

Spleen: Negative.

Adrenals/Urinary Tract: Normal adrenal glands.

Benign small renal cysts suspected in both kidneys. No
hydronephrosis or hydroureter.

There is a punctate right lower pole renal calculus on coronal image
80.

A small bladder dome diverticulum is suspected on series 3, image
110. Otherwise the bladder is within normal limits.

Stomach/Bowel: Low-density retained stool in the rectum. Mild motion
artifact in the mid abdomen. There is irregular wall thickening of
large bowel at the junction of the sigmoid and rectum (coronal
images 68 through 78) measuring up to 15 mm in thickness over a
segment of about 7 cm. There is regional dependent presacral
stranding although no directly adjacent pericolonic stranding. No
regional lymphadenopathy.

Redundant sigmoid colon. Decompressed descending colon. Redundant
transverse colon with retained stool. Similar retained stool in the
right colon. No other large bowel wall thickening identified.
Diminutive or absent appendix. Motion artifact at the ileocecal
valve. Terminal ileum appears negative.

No dilated small bowel. Decompressed stomach. No free air. No free
fluid in the abdomen. No small bowel mesenteric stranding.

Vascular/Lymphatic: Aortoiliac calcified atherosclerosis. Normal
caliber aorta. Vascular patency is not evaluated in the absence of
IV contrast.

No lymphadenopathy.

Reproductive: Negative.

Other: Mild presacral stranding.  No overt pelvic free fluid.

Musculoskeletal: Diffuse lumbar vacuum disc. Lumbar scoliosis.
Osteopenia. No acute osseous abnormality identified.
IMPRESSION: 1. Irregular wall thickening at the junction of the sigmoid colon
and rectum over a segment of about 7 cm appears indeterminate for
Tumor versus Proctitis. Regional presacral inflammatory stranding.
No lymphadenopathy.

2. Cardiomegaly with small layering left and trace right pleural
effusions.
Bilateral pulmonary reticular and ground-glass opacity could reflect
interstitial edema and/or atelectasis superimposed on a degree of
chronic lung disease. Viral/atypical respiratory infection is felt
less likely.

3. No other acute or inflammatory process identified in the
non-contrast chest, abdomen, or pelvis.

4. Calcified coronary artery and Aortic Atherosclerosis
(MBSHX-B23.3). Right nephrolithiasis.

## 2020-12-26 ENCOUNTER — Ambulatory Visit: Payer: Medicare HMO | Admitting: Cardiology
